# Patient Record
Sex: Female | Born: 1940 | Race: White | Hispanic: No | Smoking: Former smoker
Health system: Southern US, Community
[De-identification: ages and names within clinical notes are randomized; demographics above are authoritative.]

## PROBLEM LIST (undated history)

## (undated) DIAGNOSIS — F32A Depression, unspecified: Secondary | ICD-10-CM

## (undated) DIAGNOSIS — R7303 Prediabetes: Secondary | ICD-10-CM

## (undated) DIAGNOSIS — E785 Hyperlipidemia, unspecified: Secondary | ICD-10-CM

## (undated) DIAGNOSIS — F419 Anxiety disorder, unspecified: Secondary | ICD-10-CM

## (undated) DIAGNOSIS — F329 Major depressive disorder, single episode, unspecified: Secondary | ICD-10-CM

## (undated) DIAGNOSIS — I10 Essential (primary) hypertension: Secondary | ICD-10-CM

## (undated) DIAGNOSIS — M1990 Unspecified osteoarthritis, unspecified site: Secondary | ICD-10-CM

## (undated) DIAGNOSIS — T7840XA Allergy, unspecified, initial encounter: Secondary | ICD-10-CM

## (undated) DIAGNOSIS — M8580 Other specified disorders of bone density and structure, unspecified site: Secondary | ICD-10-CM

## (undated) DIAGNOSIS — K219 Gastro-esophageal reflux disease without esophagitis: Secondary | ICD-10-CM

## (undated) DIAGNOSIS — C801 Malignant (primary) neoplasm, unspecified: Secondary | ICD-10-CM

## (undated) HISTORY — DX: Depression, unspecified: F32.A

## (undated) HISTORY — DX: Allergy, unspecified, initial encounter: T78.40XA

## (undated) HISTORY — DX: Prediabetes: R73.03

## (undated) HISTORY — PX: APPENDECTOMY: SHX54

## (undated) HISTORY — DX: Major depressive disorder, single episode, unspecified: F32.9

## (undated) HISTORY — PX: CATARACT EXTRACTION W/ INTRAOCULAR LENS  IMPLANT, BILATERAL: SHX1307

## (undated) HISTORY — DX: Anxiety disorder, unspecified: F41.9

## (undated) HISTORY — DX: Malignant (primary) neoplasm, unspecified: C80.1

## (undated) HISTORY — PX: ABDOMINAL EXPLORATION SURGERY: SHX538

## (undated) HISTORY — PX: OTHER SURGICAL HISTORY: SHX169

## (undated) HISTORY — DX: Other specified disorders of bone density and structure, unspecified site: M85.80

---

## 1967-08-19 HISTORY — PX: DILATION AND CURETTAGE OF UTERUS: SHX78

## 1978-08-18 HISTORY — PX: TUBAL LIGATION: SHX77

## 1999-01-14 ENCOUNTER — Ambulatory Visit (HOSPITAL_COMMUNITY): Admission: RE | Admit: 1999-01-14 | Discharge: 1999-01-14 | Payer: Self-pay | Admitting: Cardiovascular Disease

## 1999-01-14 ENCOUNTER — Encounter: Payer: Self-pay | Admitting: Cardiovascular Disease

## 1999-01-17 ENCOUNTER — Other Ambulatory Visit: Admission: RE | Admit: 1999-01-17 | Discharge: 1999-01-17 | Payer: Self-pay | Admitting: Obstetrics and Gynecology

## 2000-02-13 ENCOUNTER — Other Ambulatory Visit: Admission: RE | Admit: 2000-02-13 | Discharge: 2000-02-13 | Payer: Self-pay | Admitting: Obstetrics and Gynecology

## 2001-06-23 ENCOUNTER — Other Ambulatory Visit: Admission: RE | Admit: 2001-06-23 | Discharge: 2001-06-23 | Payer: Self-pay | Admitting: Obstetrics and Gynecology

## 2003-02-17 ENCOUNTER — Other Ambulatory Visit: Admission: RE | Admit: 2003-02-17 | Discharge: 2003-02-17 | Payer: Self-pay | Admitting: Obstetrics and Gynecology

## 2003-03-19 DIAGNOSIS — M8580 Other specified disorders of bone density and structure, unspecified site: Secondary | ICD-10-CM

## 2003-03-19 HISTORY — DX: Other specified disorders of bone density and structure, unspecified site: M85.80

## 2004-04-02 ENCOUNTER — Other Ambulatory Visit: Admission: RE | Admit: 2004-04-02 | Discharge: 2004-04-02 | Payer: Self-pay | Admitting: Obstetrics and Gynecology

## 2005-08-18 HISTORY — PX: KNEE ARTHROSCOPY: SHX127

## 2006-08-18 HISTORY — PX: NECK SURGERY: SHX720

## 2007-01-19 ENCOUNTER — Encounter (INDEPENDENT_AMBULATORY_CARE_PROVIDER_SITE_OTHER): Payer: Self-pay | Admitting: Otolaryngology

## 2007-01-19 ENCOUNTER — Ambulatory Visit (HOSPITAL_BASED_OUTPATIENT_CLINIC_OR_DEPARTMENT_OTHER): Admission: RE | Admit: 2007-01-19 | Discharge: 2007-01-19 | Payer: Self-pay | Admitting: Otolaryngology

## 2007-04-02 ENCOUNTER — Ambulatory Visit: Payer: Self-pay | Admitting: Internal Medicine

## 2007-09-14 ENCOUNTER — Other Ambulatory Visit: Admission: RE | Admit: 2007-09-14 | Discharge: 2007-09-14 | Payer: Self-pay | Admitting: Obstetrics and Gynecology

## 2008-04-05 ENCOUNTER — Ambulatory Visit: Payer: Self-pay | Admitting: Internal Medicine

## 2008-04-19 ENCOUNTER — Encounter: Payer: Self-pay | Admitting: Internal Medicine

## 2008-04-19 ENCOUNTER — Ambulatory Visit: Payer: Self-pay | Admitting: Internal Medicine

## 2008-04-21 ENCOUNTER — Encounter: Payer: Self-pay | Admitting: Internal Medicine

## 2008-11-28 ENCOUNTER — Observation Stay (HOSPITAL_COMMUNITY): Admission: EM | Admit: 2008-11-28 | Discharge: 2008-11-28 | Payer: Self-pay | Admitting: Emergency Medicine

## 2008-11-28 ENCOUNTER — Ambulatory Visit: Payer: Self-pay | Admitting: Family Medicine

## 2008-11-28 IMAGING — CR DG CHEST 1V PORT
1 series · 1 of 1 positions shown · non-contrast
Comparison: None.

CLINICAL DATA: Chest and throat tightness.

PORTABLE CHEST - 1 VIEW

[AP]
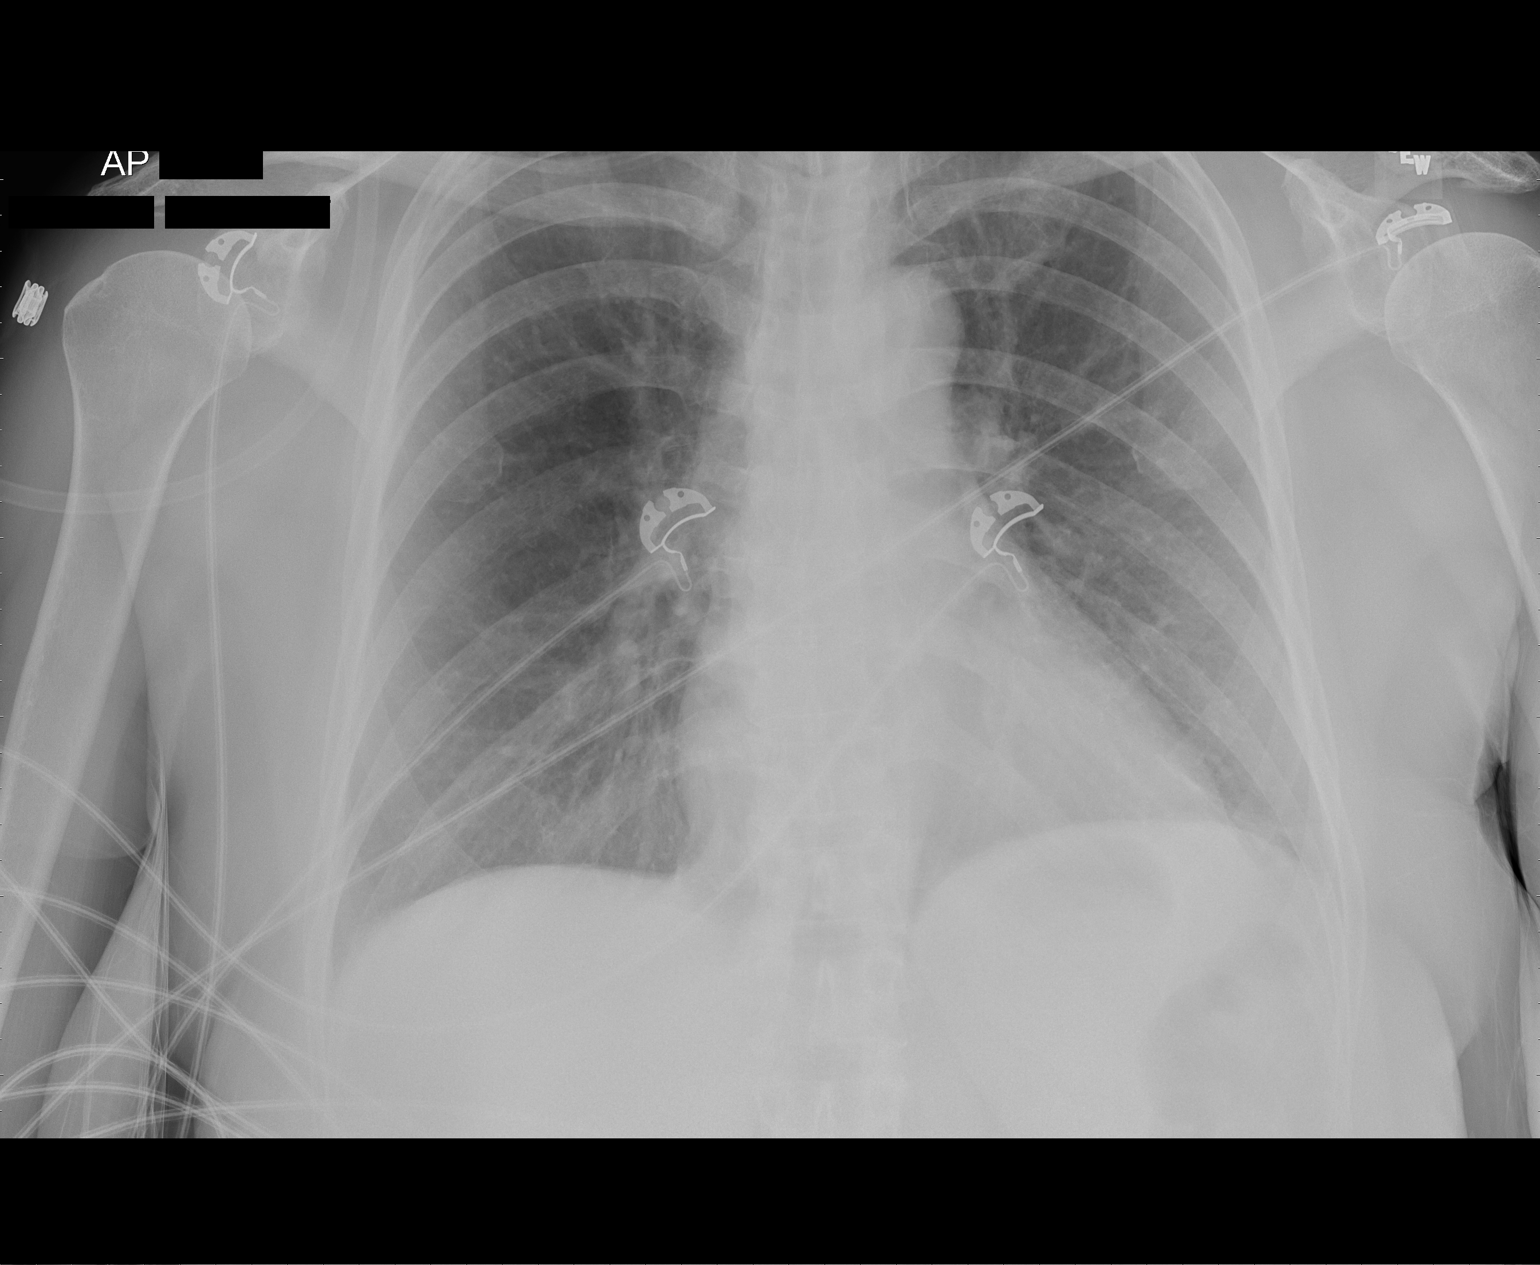

[1 of 1 positions shown; findings below may reference images not displayed]

FINDINGS: Normal sized heart.  Clear lungs.  Minimal scoliosis.
IMPRESSION: No acute abnormality.

## 2009-10-15 ENCOUNTER — Encounter: Admission: RE | Admit: 2009-10-15 | Discharge: 2009-10-15 | Payer: Self-pay | Admitting: Emergency Medicine

## 2009-10-15 IMAGING — RF DG UGI W/ HIGH DENSITY W/KUB
11 series · 11 of 11 positions shown · non-contrast
Comparison: None.

CLINICAL DATA: Abdominal pain.

UPPER GI SERIES WITH KUB
TECHNIQUE: Routine upper GI series was performed with thin and
high density barium.
Fluoroscopy Time: 1.6 minutes

[Series 1: run · 1 of 1 slices shown (1 of 10)]
[im 1/1]
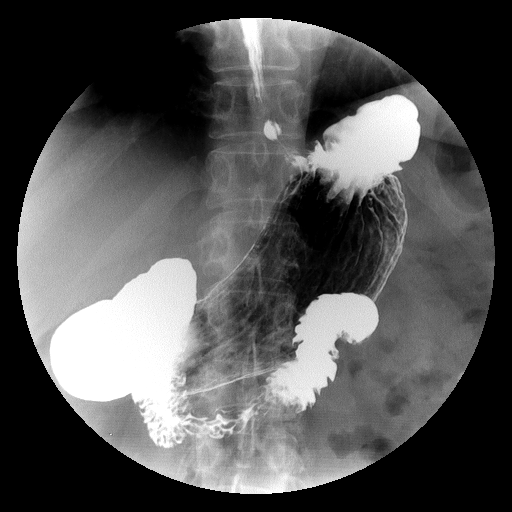

[Series 2: run · 1 of 1 slices shown (2 of 10)]
[im 1/1]
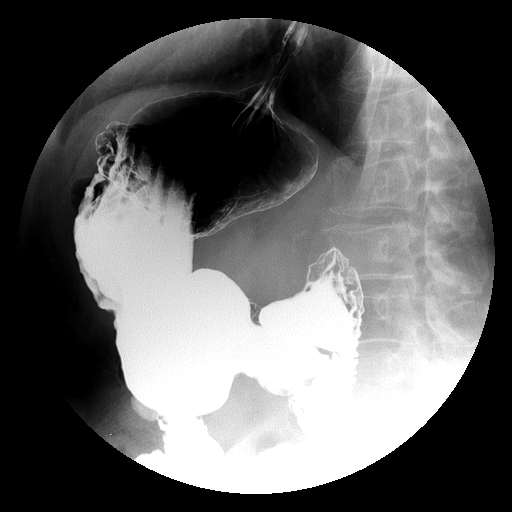

[Series 3: run · 1 of 1 slices shown (3 of 10)]
[im 1/1]
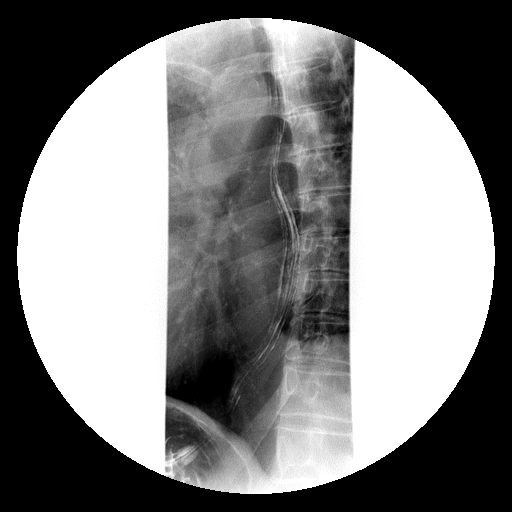

[Series 4: run · 1 of 1 slices shown (4 of 10)]
[im 1/1]
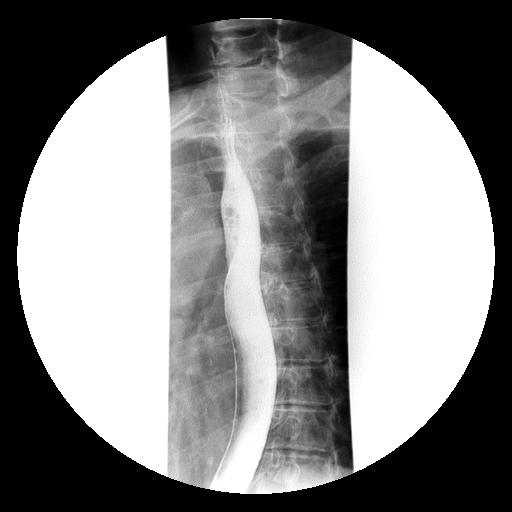

[Series 5: run · 1 of 1 slices shown (5 of 10)]
[im 1/1]
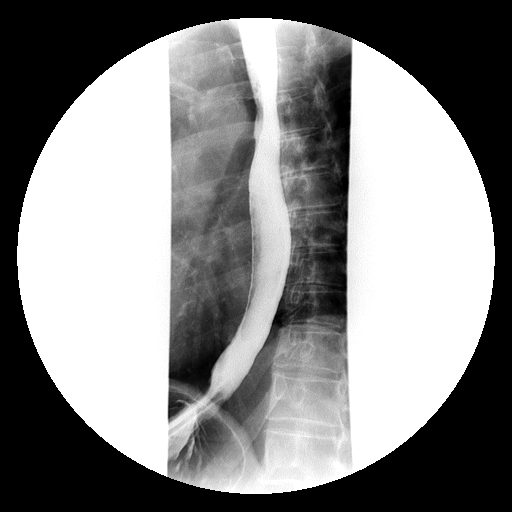

[Series 6: run · 1 of 1 slices shown (6 of 10)]
[im 1/1]
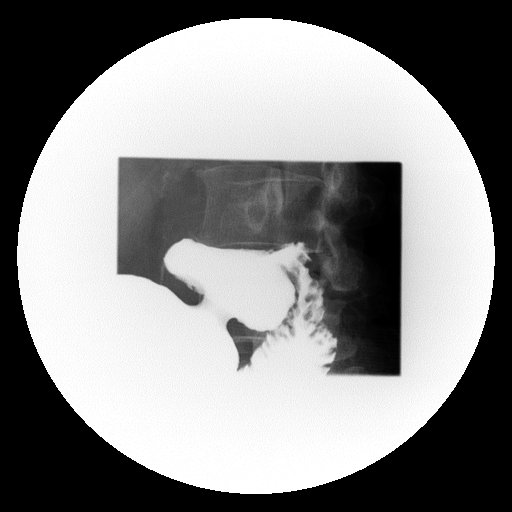

[Series 7: run · 1 of 1 slices shown (7 of 10)]
[im 1/1]
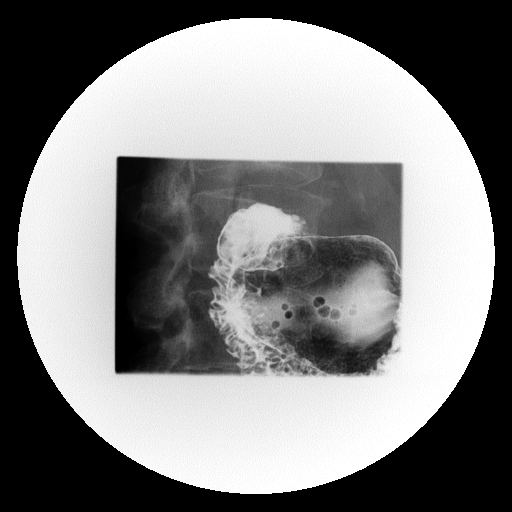

[Series 8: run · 1 of 1 slices shown (8 of 10)]
[im 1/1]
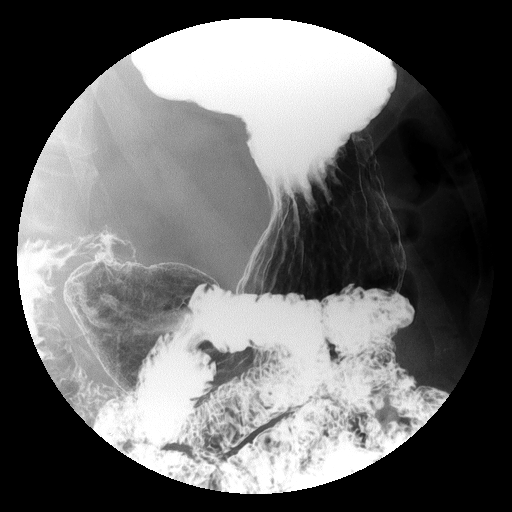

[Series 9: run · 1 of 1 slices shown (9 of 10)]
[im 1/1]
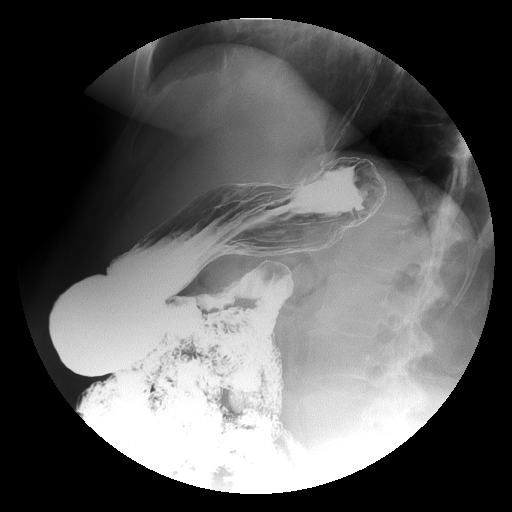

[Series 10: run · 1 of 1 slices shown (10 of 10)]
[im 1/1]
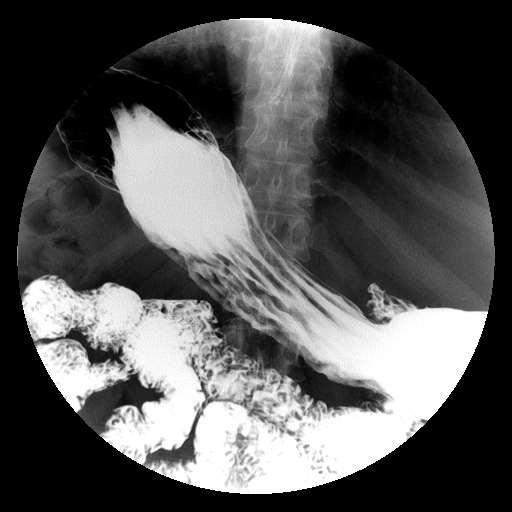

[Series 1001: view not recorded · 0.20mm/px · 1 of 1 slices shown]
[im 1/1]
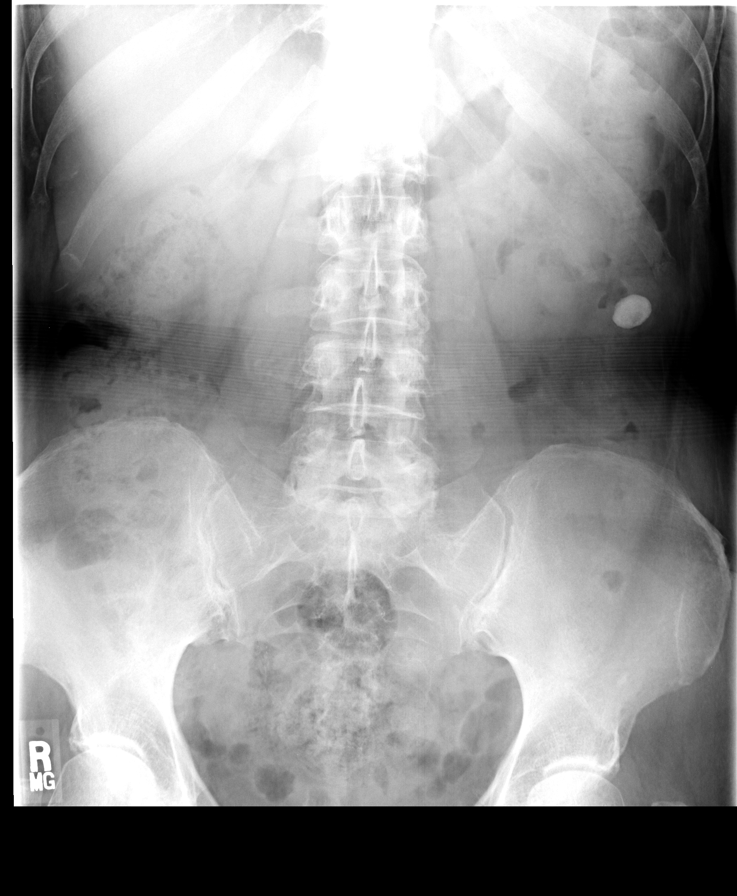

[11 of 11 positions shown; findings below may reference images not displayed]

FINDINGS: Scout view of the abdomen shows stool throughout the
colon.  A 2 cm ovoid high density structure in the left mid abdomen
is presumably within the colon.  Phleboliths are seen in the
anatomic pelvis.  There are degenerative changes in the lower
lumbar spine and hips.

Primary peristalsis is occasionally sluggish.  No esophageal fold
thickening, stricture or obstruction.  There may be a tiny hiatal
hernia.  Stomach and duodenal bulb are unremarkable.
IMPRESSION: Occasionally sluggish primary esophageal peristalsis.

## 2009-10-15 IMAGING — US US ABDOMEN COMPLETE
1 series · 14 of 25 positions shown · non-contrast
Comparison: None.

CLINICAL DATA: Abdominal pain.

COMPLETE ABDOMINAL ULTRASOUND

[Series 1: us abdomen complete · 0.24mm/px · 14 of 84 slices shown]
[im 1/84]
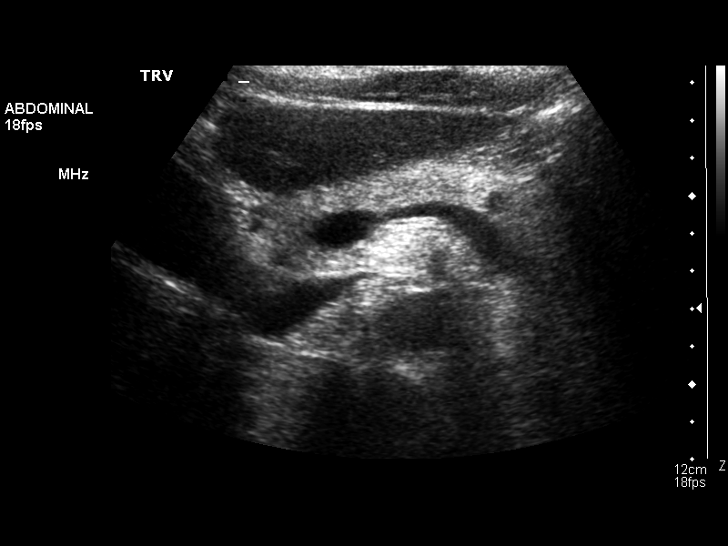
[im 7/84]
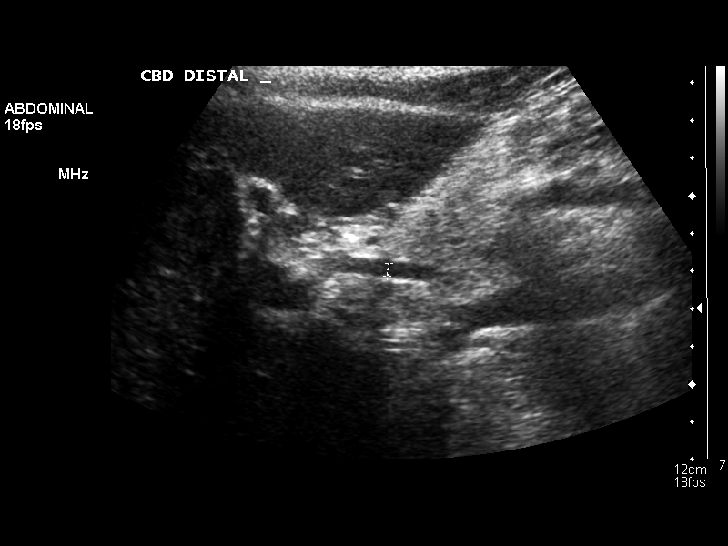
[im 14/84]
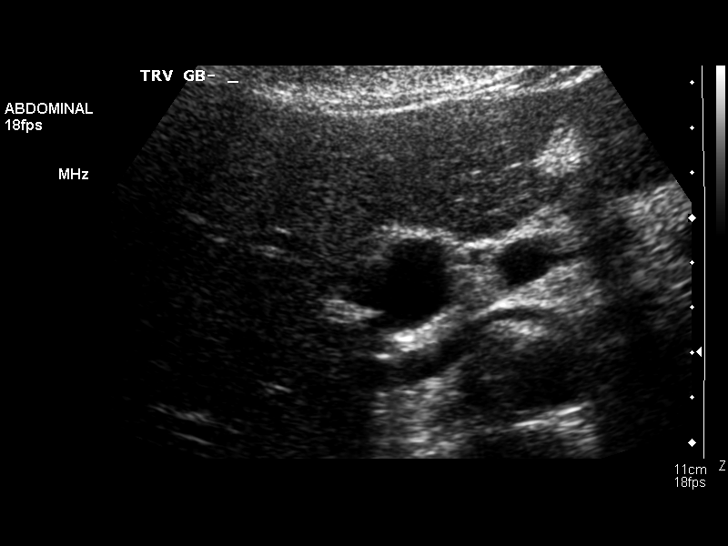
[im 21/84]
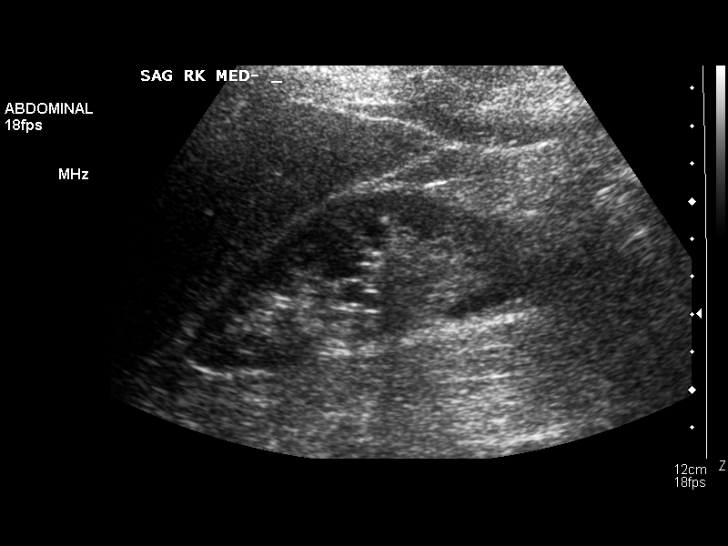
[im 28/84]
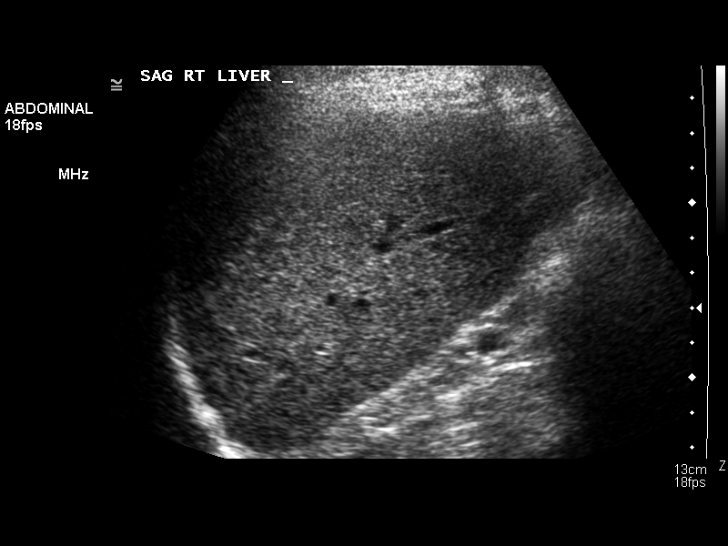
[im 32/84]
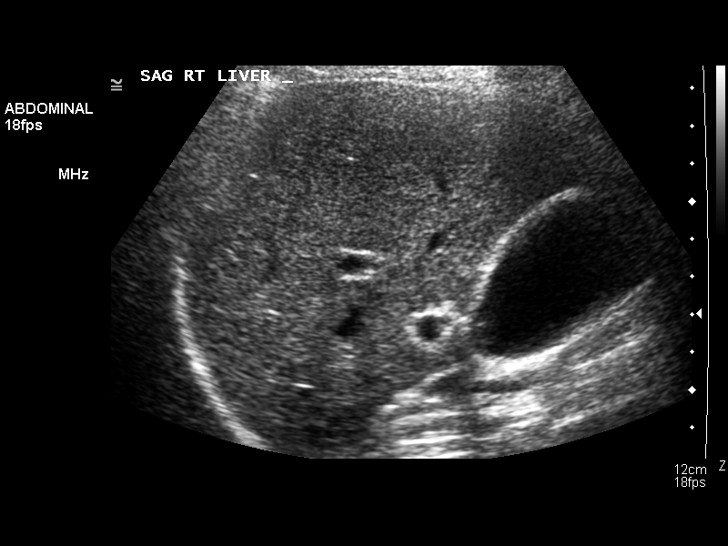
[im 39/84]
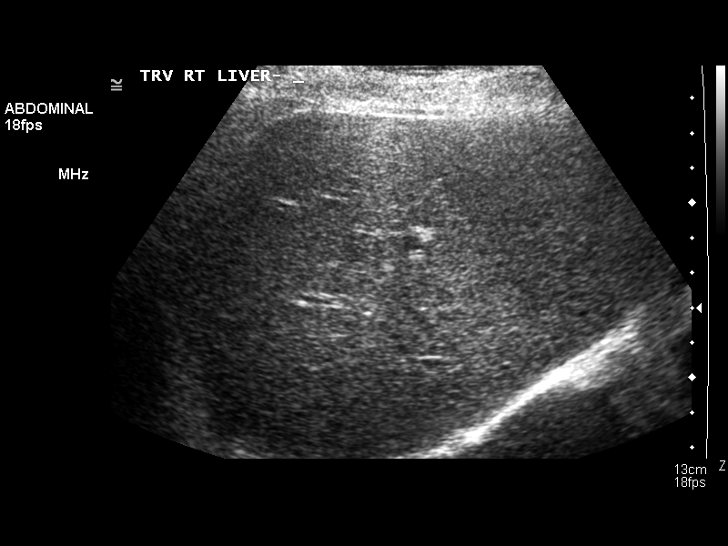
[im 45/84]
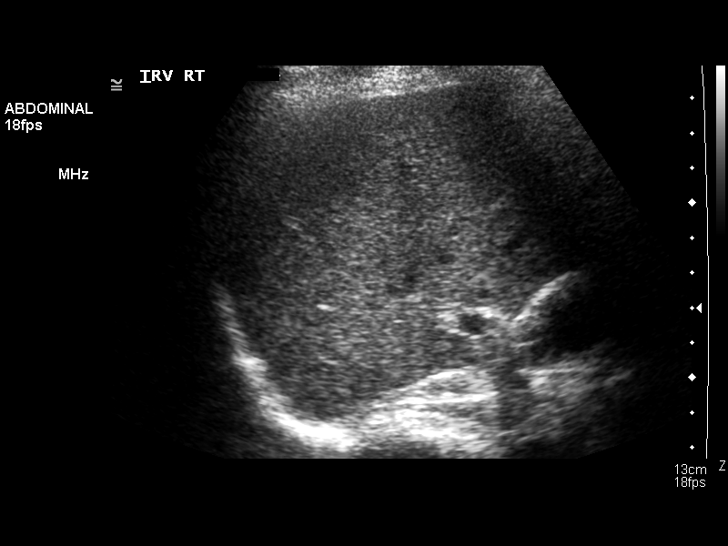
[im 52/84]
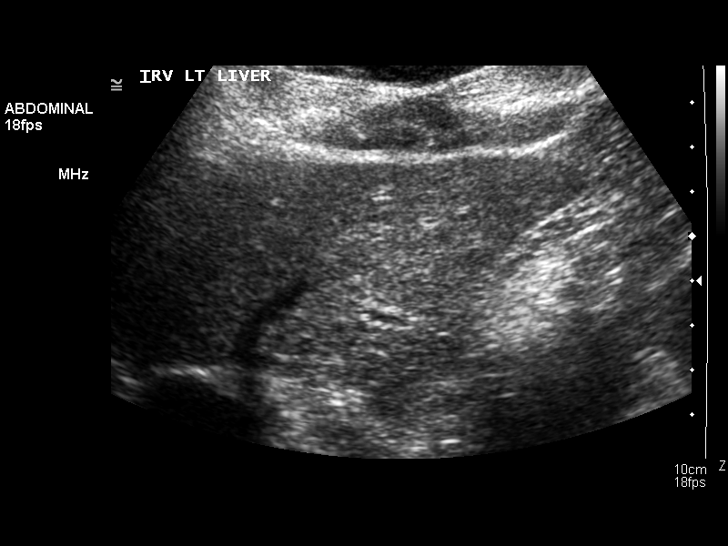
[im 56/84]
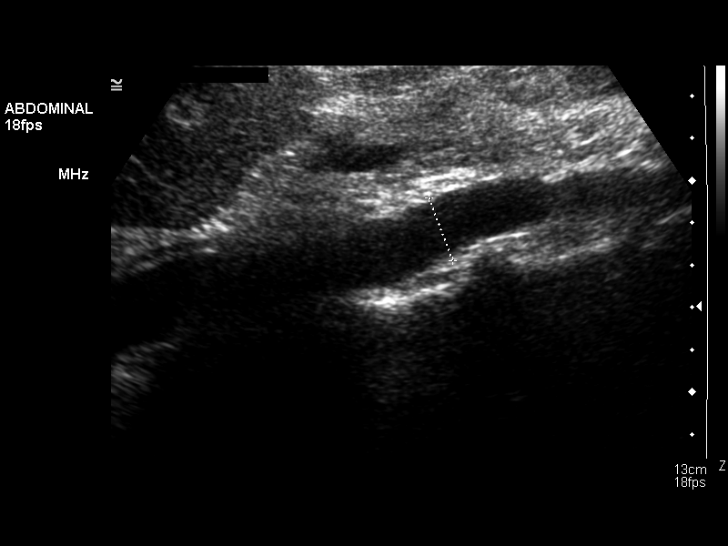
[im 63/84]
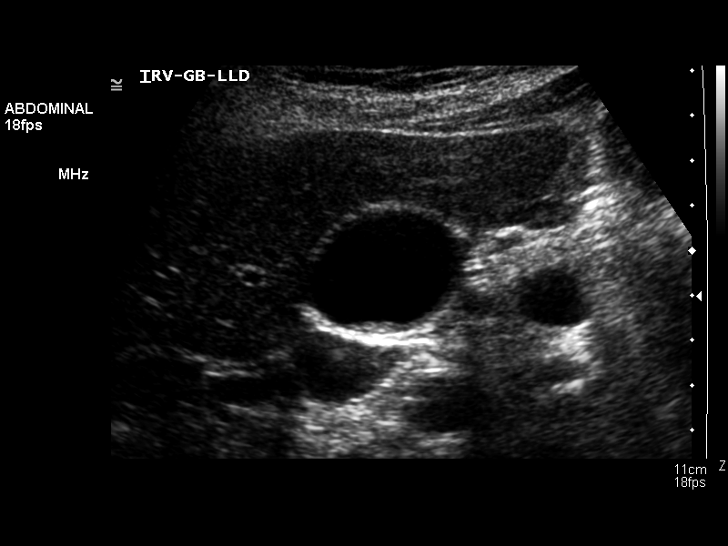
[im 70/84]
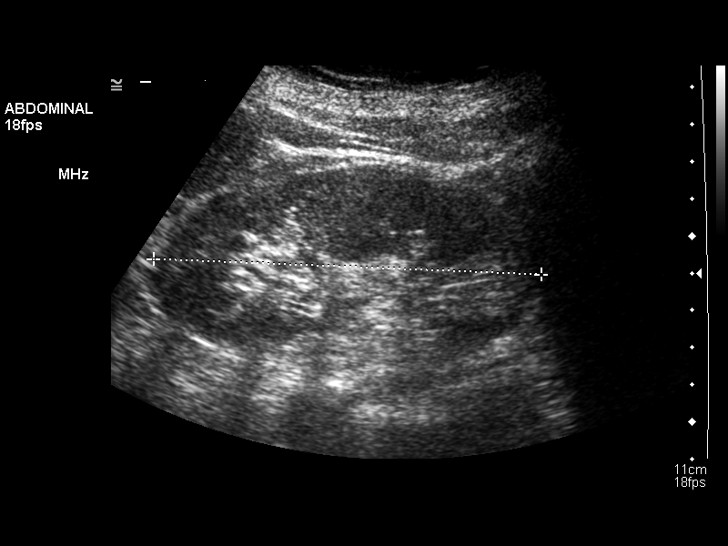
[im 77/84]
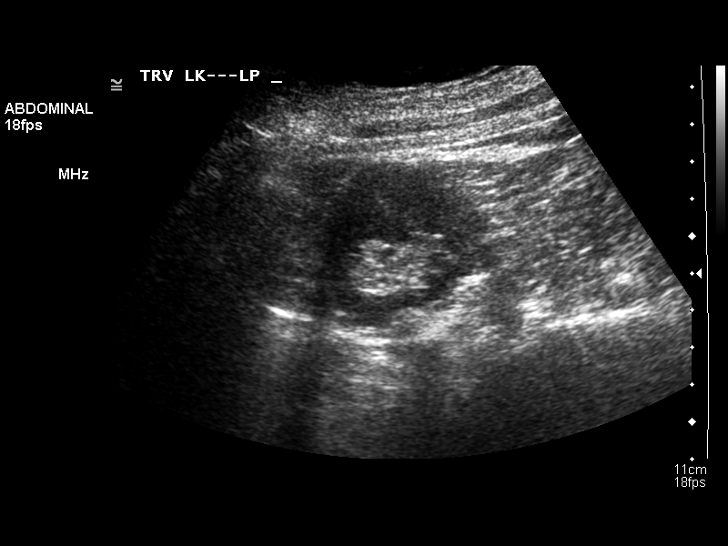
[im 84/84]
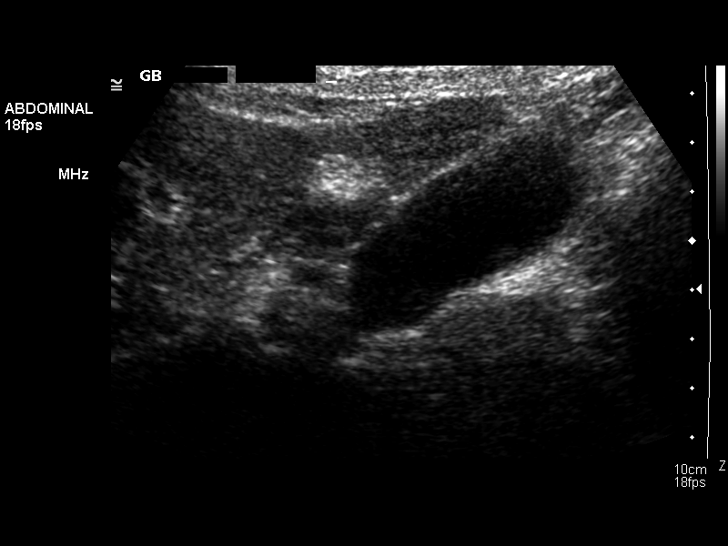

[14 of 25 positions shown; findings below may reference images not displayed]

FINDINGS: Gallbladder:  Small amount of nonshadowing debris is seen in the
gallbladder, dependently.  No shadowing stones.  No gallbladder
wall thickening or sonographic Murphy's sign.

Common bile duct:  Measures 4 mm, within normal limits.

Liver:  Negative.

IVC:  Visualized.

Pancreas:  Negative.

Spleen:  Measures 6.3 cm, negative.

Right Kidney:  Measures 11.0 cm, negative.

Left Kidney:  Measures 10.4 cm, negative.

Abdominal aorta:  No aneurysm identified.
IMPRESSION: 1.  No acute findings.
2.  Gallbladder sludge.

## 2009-11-20 ENCOUNTER — Ambulatory Visit: Payer: Self-pay | Admitting: Obstetrics and Gynecology

## 2009-11-20 ENCOUNTER — Other Ambulatory Visit: Admission: RE | Admit: 2009-11-20 | Discharge: 2009-11-20 | Payer: Self-pay | Admitting: Obstetrics and Gynecology

## 2010-09-08 ENCOUNTER — Encounter: Payer: Self-pay | Admitting: Emergency Medicine

## 2010-11-27 LAB — DIFFERENTIAL
Basophils Absolute: 0 10*3/uL (ref 0.0–0.1)
Eosinophils Relative: 1 % (ref 0–5)
Lymphocytes Relative: 42 % (ref 12–46)
Lymphs Abs: 2.4 10*3/uL (ref 0.7–4.0)
Monocytes Absolute: 0.4 10*3/uL (ref 0.1–1.0)
Neutro Abs: 2.8 10*3/uL (ref 1.7–7.7)

## 2010-11-27 LAB — HEMOGLOBIN A1C: Hgb A1c MFr Bld: 6.1 % (ref 4.6–6.1)

## 2010-11-27 LAB — LIPID PANEL
Cholesterol: 191 mg/dL (ref 0–200)
LDL Cholesterol: 126 mg/dL — ABNORMAL HIGH (ref 0–99)
Triglycerides: 84 mg/dL (ref <150)

## 2010-11-27 LAB — CBC
HCT: 39.2 % (ref 36.0–46.0)
Hemoglobin: 13.6 g/dL (ref 12.0–15.0)
RDW: 13.6 % (ref 11.5–15.5)
WBC: 5.7 10*3/uL (ref 4.0–10.5)

## 2010-11-27 LAB — POCT I-STAT, CHEM 8
BUN: 18 mg/dL (ref 6–23)
Calcium, Ion: 1.18 mmol/L (ref 1.12–1.32)
Chloride: 102 mEq/L (ref 96–112)
Creatinine, Ser: 0.9 mg/dL (ref 0.4–1.2)
Sodium: 137 mEq/L (ref 135–145)
TCO2: 28 mmol/L (ref 0–100)

## 2010-11-27 LAB — POCT CARDIAC MARKERS
Myoglobin, poc: 108 ng/mL (ref 12–200)
Myoglobin, poc: 191 ng/mL (ref 12–200)
Troponin i, poc: <0.05 ng/mL (ref 0.00–0.09)

## 2010-11-27 LAB — CARDIAC PANEL(CRET KIN+CKTOT+MB+TROPI): Relative Index: 1 (ref 0.0–2.5)

## 2010-12-31 NOTE — Op Note (Signed)
[name], [name]            ACCOUNT NO.:  [id number]   MEDICAL RECORD NO.:  [id number]          PATIENT TYPE:  AMB   LOCATION:  DSC                          FACILITY:  [facility]   PHYSICIAN:  Christopher E. Ezzard Standing, M.D.DATE OF BIRTH:  1941-01-04   DATE OF PROCEDURE:  01/19/2007  DATE OF DISCHARGE:                               OPERATIVE REPORT   PREOPERATIVE DIAGNOSIS:  Right submandibular gland mass.   POSTOPERATIVE DIAGNOSIS:  Right submandibular gland mass.   OPERATIONS:  Excision of right submandibular gland.   SURGEON:  Kristine Garbe. Ezzard Standing, M.D.   ANESTHESIA:  General endotracheal.   COMPLICATIONS:  None.   BRIEF CLINICAL NOTE:  Carol Tucker is a 70 year old female who has  had a large right submandibular gland for about 10 years.  She feels  like it got a little bit larger.  It is twice the size of the left  submandibular gland.  It protrudes in her neck, measuring approximately  4-5 cm in size.  She is taken to the operating room at this time for  excision of right submandibular gland.   DESCRIPTION OF PROCEDURE:  After adequate endotracheal anesthesia, the  patient received 1 gram Ancef IV preoperatively.  The neck was prepped  with Betadine solution and draped out with sterile towels.  The proposed  incision site at the inferior aspect of the mass was marked out and  injected with Xylocaine with epinephrine.   An incision was then made down through the skin, subcutaneous tissue,  and platysma muscle.  The facial vein was identified and ligated with 2-  0 silk suture and retracted superiorly, and dissection was carried out  on the inferior aspect of the submandibular gland initially and tissue  reflected superiorly so as to protect the marginal nerve.  The facial  artery branch to the gland was identified and ligated with 2-0 silk  suture.  The submandibular gland was then dissected off of the digastric  muscle inferiorly.  In the submandibular region, the  submandibular duct  and the branch of the lingual nerve were identified, and this area was  ligated with 2-0 silk suture and divided.  Hemostasis was obtained with  cautery and 3-0 silk ligatures in the smaller vessels.  The wound was  irrigated with saline.  The specimen was sent to pathology in saline.  After obtaining adequate hemostasis, the defect was closed with 3-0  chromic sutures subcutaneously and 5-0 nylon to reapproximate the skin  edges.  A dressing was applied.   The patient was awakened from anesthesia and transferred to the recovery  room postoperatively doing well.   DISPOSITION:  [name] is discharged home later this morning on Tylenol  and Vicodin p.r.n. pain, Keflex 500 mg b.i.d. for 4 days.  I will have  her follow up in my office in 6 days for recheck and to have the sutures  removed and review pathology.           ______________________________  Kristine Garbe Ezzard Standing, M.D.     CEN/MEDQ  D:  01/19/2007  T:  01/19/2007  Job:  [id number]

## 2010-12-31 NOTE — H&P (Signed)
NAME:  Carol Tucker, TECSON            ACCOUNT NO.:  [id number]   MEDICAL RECORD NO.:  [id number]          PATIENT TYPE:  EMS   LOCATION:  MAJO                         FACILITY:  [facility]   PHYSICIAN:  Paula Compton, MD        DATE OF BIRTH:  09-05-1940   DATE OF ADMISSION:  11/28/2008  DATE OF DISCHARGE:                              HISTORY & PHYSICAL   PRIMARY CARE PHYSICIAN:  Jonita Albee, MD with [facility].   CHIEF COMPLAINT:  Throat tightness and chest tightness.   HISTORY OF PRESENT ILLNESS:  Ms. Carol Tucker is a pleasant 70 year old  white female with a history of hyperlipidemia and hypertension as well  as anxiety and depression with no significant cardiac history in herself  that presents with about 8 hours of throat tightness radiating to her  right jaw that started at rest.  Progressed throughout the night  dyspnea, chest pressure, and shortness of breath when reading [employer] that gave her the indications of an MI in females.  She also has  a significant family history for MI in her father when he was in his  66s.  The patient took aspirin x2 without relief and called EMS,  nitroglycerin x2 relieved the chest pressure that was nonexertional, but  throat symptoms had resolved by the time ED arrived.  She is currently  at baseline with mild nausea.  She has remote smoking history and also  negative Cardiolite per patient in 2000.   REVIEW OF SYSTEMS:  As above, otherwise negative.   PAST MEDICAL HISTORY:  1. Hypertension.  2. Hyperlipidemia.  3. Anxiety.  4. Depression.  She was previously on medications for her depression.   MEDICATIONS:  1. Hydrochlorothiazide 12.5 p.o. daily.  2. Prevacid 15 mg p.o. daily.  3. Aspirin 81 mg p.o. daily.  4. Calcium plus vitamin D daily.   ALLERGIES:  She has an allergy to [facility], which causes extrapyramidal  symptoms.   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   SOCIAL HISTORY:  She lives in [place] with her son-in-law and his  mom.  She is a retired Engineer, site.  She has a remote smoking  history.  She quit over 30 years ago, but has a 10-year smoking history.  She occasionally drinks wine.  Denies any drugs.   FAMILY HISTORY:  Significant for father who had an MI in his 88s.   PHYSICAL EXAMINATION:  VITAL SIGNS:  Temperature 96.6, pulse 77,  respirations 16, blood pressure of 186/79 upon admission to the ED, but  she has systolic in the 140s during our exam, pulse ox 100% on 4 L,  which was turned off and maintained at 100% during our exam.  GENERAL:  She is alert and oriented x3 and in no apparent distress.  HEENT:  Normocephalic and atraumatic.  Pupils are equal, round, and  reactive to light.  Extraocular muscles intact.  She has moist mucous  membranes.  NECK:  Supple.  She has  no masses.  Full range of motion.  CARDIOVASCULAR:  Regular rate and rhythm.  No murmurs, rubs, or gallops.  LUNGS:  Clear to auscultation bilaterally.  ABDOMEN:  Soft, nontender, and nondistended.  Bowel sounds x4.  EXTREMITIES:  No cyanosis, clubbing, or edema.  NEUROLOGIC:  Cranial nerves II through XII are intact.  She has strength  normal in all extremities.  DTRs are normal.  Sensations are intact.  SKIN:  No rashes.   LABORATORIES AND STUDIES:  White blood cell 5.7, hemoglobin 13.6,  hematocrit 39.2, and platelets 176.  Sodium 137, potassium 3.6, chloride  102, CO2 of 28, BUN 18, creatinine 0.9, glucose 122.  EKG is normal  sinus rhythm with a rate of 78.  Point-of-care enzymes are negative.  Chest x-ray is negative for acute abnormality.   ASSESSMENT AND PLAN:  The patient is a 70 year old white female with:  1. Throat tightness and chest pressure.  Her etiology is very unlikely      to be cardiac.  Differential diagnoses includes esophageal spasm,      anxiety, psychiatry related.  Cardiac  is unlikely, although we will      admit her to telemetry bed for observation overnight.  To rule out      an myocardial infarction, we will cycle cardiac enzymes.  Get a      fasting lipid panel and A1c in the morning.  We will also repeat      EKG in the morning.  We will provide a p.r.n. gastrointestinal      cocktail.  The patient has another episode of throat tightness to      see if that has any effect on her tightness/pain.  I will consider      Xanax as needed if she also has tightness.  She is status post      aspirin.  No oxygen is needed.  Her cardiologist is Dr. Elease Hashimoto and      can be seen as an outpatient.  2. Hypertension.  Continue her home medications.  3. Hyperlipidemia.  Continue home medications.  4. Fluids, electrolytes, nutrition/gastrointestinal.  Heart healthy      diet.  Hep-Lock IV.  Her electrolytes were within normal limits.  5. Prophylaxis.  Proton pump inhibitor for peptic ulcer disease and      heparin for deep vein thrombosis.   DISPOSITION:  Pending MI rule out and chest pain free.      Helane Rima, MD  Electronically Signed      Paula Compton, MD  Electronically Signed    EW/MEDQ  D:  11/28/2008  T:  11/28/2008  Job:  [id number]

## 2011-01-03 NOTE — Discharge Summary (Signed)
Carol Tucker, GOEHRING            ACCOUNT NO.:  [id number]   MEDICAL RECORD NO.:  [id number]          PATIENT TYPE:  OBV   LOCATION:  3735                         FACILITY:  [facility]   PHYSICIAN:  Paula Compton, MD        DATE OF BIRTH:  07-12-1941   DATE OF ADMISSION:  11/28/2008  DATE OF DISCHARGE:  11/28/2008                               DISCHARGE SUMMARY   PRIMARY CARE PHYSICIAN:  Dr. Kevan Ny with [facility].   DISCHARGE DIAGNOSES:  1. Throat tightness.  2. Hypertension.  3. Hyperlipidemia.  4. Anxiety.  5. Depression.   DISCHARGE MEDICATIONS:  1. Aspirin 81 mg by mouth daily.  2. Hydrochlorothiazide 12.5 mg by mouth daily.  3. Protonix 40 mg by mouth daily.  4. Zocor 20 mg by mouth nightly.  5. Zofran 4 mg by mouth every 6 hours as needed for nausea, #20, no      refills.   PROCEDURES:  Chest x-ray:  Impression:  No acute abnormality.   LABORATORY DATA:  White blood cell 5.7, hemoglobin 13.6, hematocrit  39.2, platelets 176.  Sodium 137, potassium 2.6, chloride 102, glucose  122, BUN 18, creatinine 0.9.  Two point of care markers 2 hours apart,  as well as a cardiac enzyme panel 6 hours later were all negative.  Cholesterol 191, triglyceride 84, HDL 48, LDL 126, hemoglobin A1c 6.1.   BRIEF HOSPITAL COURSE:  1. Please see H&P for details, but briefly, Ms. Laurell Coalson is a      pleasant 70 year old female with a history of hyperlipidemia,      hypertension, and anxiety with no significant cardiac history that      presented with about 8 hours of throat tightness that radiated to      her right jaw.  She said that it started at rest and progressed      throughout the night and became associated with dyspnea and chest      pressure and increased while she was reading some food magazine      with indications of an MI in female.  She took 4 baby aspirin and      called EMS, brought her to the emergency department.  She was      admitted to telemetry bed and observed overnight.   The EKG in the      emergency department was normal sinus rhythm, no ST changes.  An      EKG in the morning had no changes.  Cardiac enzymes were cycled and      were all negative x3.  The patient's fasting lipid panel and A1c      were all within normal limits.  She felt very well in the morning,      had no complaints, all symptoms have resolved.  She was discharged      with instructions to follow up with her primary care physician, Dr.      Kevan Ny as well as to set up an outpatient stress test with Dr.      Elease Hashimoto her cardiologist.  She has a cardiologist only because she  had a negative Cardiolite in 2000.  It was felt that this was      probably secondary to an anxiety or panic attack.  However, the      patient was discharged home with Protonix in case for the GI      component to her throat tightness.  She was given red flags that      would prompt quick return for seeking medical care.  2. Hypertension.  The patient was maintained on her home medications.  3. Hyperlipidemia.  The patient's fasting lipid panel as above.      Continue Zocor 20 mg by mouth each night.   FOLLOWUP:  The patient was instructed to follow up with Dr. Kevan Ny in 1-2  weeks as above and to follow up with Dr. Elease Hashimoto for an outpatient  workup.      Helane Rima, MD  Electronically Signed      Paula Compton, MD  Electronically Signed    EW/MEDQ  D:  11/29/2008  T:  11/30/2008  Job:  [id number]

## 2011-01-03 NOTE — Assessment & Plan Note (Signed)
[facility]                                 ON-CALL NOTE   Carol Tucker, Carol Tucker                     MRN:          [id number]  DATE:04/27/2007                            DOB:          04-27-1941    PHONE NUMBER:  [phone].   TIME OF CALL:  6:10 p.m.   GASTROENTEROLOGIST:  Wilhemina Bonito. Marina Goodell, M.D.   [name] called this evening shortly after beginning a MiraLax and  Gatorade prep.  She relates that she has developed shaking chills and  malaise.  She was not feeling poorly prior to the bowel preparation  starting.  She has no chest pain, shortness of breath, or vomiting.  She  has had some mild nausea since beginning the prep.  I advised her to  discontinue the prep and see if her symptoms resolve over the next 20-30  minutes, and to call me if she still feels poorly in any way.  If she  feels better, she may resume the preparation.  At 7:15, I received a  call from [name]'s daughter, Carol Tucker, and she reported that  the patient still was having shaking chills and felt very poorly.  I  advised them that this was likely not related to the bowel preparation,  and it seems like she has become acutely ill from another cause.  I  advised her to go to an urgent care center or the emergency room for  further evaluation this evening and to discontinue the bowel  preparation.     Venita Lick. Russella Dar, MD, [facility]  Electronically Signed    MTS/MedQ  DD: 04/28/2007  DT: 04/28/2007  Job #: [id number]   cc:   Wilhemina Bonito. Marina Goodell, MD

## 2011-02-20 ENCOUNTER — Encounter (INDEPENDENT_AMBULATORY_CARE_PROVIDER_SITE_OTHER): Payer: Medicare Other | Admitting: Obstetrics and Gynecology

## 2011-02-20 DIAGNOSIS — N951 Menopausal and female climacteric states: Secondary | ICD-10-CM

## 2011-02-20 DIAGNOSIS — M899 Disorder of bone, unspecified: Secondary | ICD-10-CM

## 2011-02-20 DIAGNOSIS — N393 Stress incontinence (female) (male): Secondary | ICD-10-CM

## 2011-02-20 DIAGNOSIS — N952 Postmenopausal atrophic vaginitis: Secondary | ICD-10-CM

## 2011-06-05 LAB — BASIC METABOLIC PANEL
BUN: 16
CO2: 29
Chloride: 104
Creatinine, Ser: 0.77
Glucose, Bld: 137 — ABNORMAL HIGH
Potassium: 4.1

## 2011-06-05 LAB — POCT HEMOGLOBIN-HEMACUE: Operator id: 128471

## 2011-09-15 ENCOUNTER — Ambulatory Visit (INDEPENDENT_AMBULATORY_CARE_PROVIDER_SITE_OTHER): Payer: Medicare Other | Admitting: Internal Medicine

## 2011-09-15 DIAGNOSIS — I10 Essential (primary) hypertension: Secondary | ICD-10-CM

## 2011-09-15 DIAGNOSIS — H6120 Impacted cerumen, unspecified ear: Secondary | ICD-10-CM

## 2011-09-15 DIAGNOSIS — E782 Mixed hyperlipidemia: Secondary | ICD-10-CM

## 2011-09-15 DIAGNOSIS — H9319 Tinnitus, unspecified ear: Secondary | ICD-10-CM

## 2011-09-15 DIAGNOSIS — H9110 Presbycusis, unspecified ear: Secondary | ICD-10-CM

## 2011-09-17 ENCOUNTER — Telehealth: Payer: Self-pay

## 2011-09-17 DIAGNOSIS — E782 Mixed hyperlipidemia: Secondary | ICD-10-CM

## 2011-09-17 NOTE — Telephone Encounter (Signed)
.  [facility] PT TAKES PRIVACTIN AND WANTED TO HAVE SOME CALLED IN FOR HER. HAD ALREADY SPOKEN WITH SOMEONE ABOUT IT. PLEASE CALL [phone]

## 2011-09-18 MED ORDER — PRAVASTATIN SODIUM 40 MG PO TABS: 40.0000 mg | ORAL_TABLET | Freq: Every day | ORAL | Status: DC

## 2011-09-18 NOTE — Telephone Encounter (Signed)
Please call patient and advise that rx has been sent. csj

## 2011-09-18 NOTE — Telephone Encounter (Signed)
Pt called and notified that rx has been called in

## 2011-11-02 ENCOUNTER — Encounter (HOSPITAL_COMMUNITY): Payer: Self-pay | Admitting: *Deleted

## 2011-11-02 ENCOUNTER — Emergency Department (HOSPITAL_COMMUNITY)
Admission: EM | Admit: 2011-11-02 | Discharge: 2011-11-03 | Disposition: A | Discharge status: Home / Self Care | Payer: Medicare Other | Attending: Emergency Medicine | Admitting: Emergency Medicine

## 2011-11-02 DIAGNOSIS — E785 Hyperlipidemia, unspecified: Secondary | ICD-10-CM | POA: Insufficient documentation

## 2011-11-02 DIAGNOSIS — Z79899 Other long term (current) drug therapy: Secondary | ICD-10-CM | POA: Insufficient documentation

## 2011-11-02 DIAGNOSIS — I10 Essential (primary) hypertension: Secondary | ICD-10-CM | POA: Insufficient documentation

## 2011-11-02 HISTORY — DX: Gastro-esophageal reflux disease without esophagitis: K21.9

## 2011-11-02 HISTORY — DX: Essential (primary) hypertension: I10

## 2011-11-02 HISTORY — DX: Hyperlipidemia, unspecified: E78.5

## 2011-11-02 NOTE — ED Notes (Signed)
Pt was noted to not have any neuro deficits.

## 2011-11-02 NOTE — ED Notes (Signed)
Pt states that she has had a headache x 2 days that she has attributed to sinuses and that tylenol did not relieve.  Pt states that she began to feel "almost dizzy" tonight around 2000.  Pt then took a second dose of her regular blood pressure medicine.  Pt states that around 2115, she started shaking and having a dry mouth.  Pt states that she no longer has a dry mouth or shaking but is slightly nauseated and right sided headache that is rated @ 6/10.

## 2011-11-03 NOTE — ED Provider Notes (Signed)
History     CSN: [id number]  Arrival date & time 11/02/11  2154   First MD Initiated Contact with Patient 11/03/11 0003      Chief Complaint  Patient presents with  . Hypertension    (Consider location/radiation/quality/duration/timing/severity/associated sxs/prior treatment) HPI History provided by pt.   Pt has had a right temporal headache x 2 days, intermittent initially but constant this afternoon and evening.  Has had similar headaches in the past but usually relieved by tylenol and/or ibuprofen and wasn't today.  No associated vision changes, dizziness, extremity weakness/paresthesias, dysphasia, dysarthria.  No head trauma.  Pt checked her BP and it was elevated at 178/76.  She took an extra dose of her lisinopril and it slightly improved.  She is concerned that her headache could be related.  Headache is currently 2/10 on pain scale.  She also had a brief episode of lightheadedness, hand tremors and dry mouth this afternoon.  No CP, SOB, palpitations.  No known h/o anxiety.    Past Medical History  Diagnosis Date  . Hypertension   . Hyperlipidemia   . GERD (gastroesophageal reflux disease)     Past Surgical History  Procedure Date  . Appendectomy     No family history on file.  History  Substance Use Topics  . Smoking status: Not on file  . Smokeless tobacco: Not on file  . Alcohol Use: No    OB History    Grav Para Term Preterm Abortions TAB SAB Ect Mult Living                  Review of Systems  All other systems reviewed and are negative.    Allergies  Metoclopramide hcl  Home Medications   Current Outpatient Rx  Name Route Sig Dispense Refill  . ACETAMINOPHEN 500 MG PO TABS Oral Take 1,000 mg by mouth every 6 (six) hours as needed. For pain    . ASPIRIN EC 81 MG PO TBEC Oral Take 81 mg by mouth daily.    Marland Kitchen FAMOTIDINE 20 MG PO TABS Oral Take 20 mg by mouth 2 (two) times daily.    Marland Kitchen HYDROCHLOROTHIAZIDE 12.5 MG PO CAPS Oral Take 12.5 mg by mouth  daily.    Marland Kitchen LISINOPRIL 5 MG PO TABS Oral Take 5 mg by mouth daily.    . ADULT MULTIVITAMIN W/MINERALS CH Oral Take 1 tablet by mouth daily.    Marland Kitchen NAPHAZOLINE HCL 0.012 % OP SOLN Both Eyes Place 1 drop into both eyes 4 (four) times daily as needed. For dry eyes    . PRAVASTATIN SODIUM 20 MG PO TABS Oral Take 20 mg by mouth daily.      BP 140/61  Pulse 72  Temp(Src) 98 F (36.7 C) (Oral)  Resp 16  SpO2 97%  Physical Exam  Nursing note and vitals reviewed. Constitutional: She is oriented to person, place, and time. She appears well-developed and well-nourished. No distress.  HENT:  Head: Normocephalic and atraumatic.  Eyes:       Normal appearance  Neck: Normal range of motion.  Cardiovascular: Normal rate and regular rhythm.        BP 147/56  Pulmonary/Chest: Effort normal and breath sounds normal.  Musculoskeletal: Normal range of motion.  Neurological: She is alert and oriented to person, place, and time. She has normal reflexes. No cranial nerve deficit or sensory deficit. Coordination normal.       5/5 and equal upper and lower extremity strength.  No past  pointing.     Skin: Skin is warm and dry. No rash noted.  Psychiatric: She has a normal mood and affect. Her behavior is normal.    ED Course  Procedures (including critical care time)  Labs Reviewed - No data to display No results found.   1. Hypertension       MDM  Pt presents w/ HTN despite compliance with lisinopril and hctz.  She took an extra lisinopril at home and BP improved to 140/61 by time of discharge.  Had a headache that may or may not have been associated but pain has nearly resolved and no focal neuro deficits on exam.  Has not had CP/SOB or vision changes.  Pt reassured and I recommended that she check her BP qd and f/u with her PCP if it continues to run high.  Return precautions discussed.         Arie Sabina [place], [place] 11/03/11 1147

## 2011-11-03 NOTE — Discharge Instructions (Signed)
Take tylenol as needed for headache.  Check your blood pressure once a day and if it continues to run high, schedule a follow up appointment with your doctor.  You should return to the ER if you develop chest pain, shortness of breath or worsening headache. Hypertension Information As your heart beats, it forces blood through your arteries. This force is your blood pressure. If the pressure is too high, it is called hypertension (HTN) or high blood pressure. HTN is dangerous because you may have it and not know it. High blood pressure may mean that your heart has to work harder to pump blood. Your arteries may be narrow or stiff. The extra work puts you at risk for heart disease, stroke, and other problems.  Blood pressure consists of two numbers, a higher number over a lower, 110/72, for example. It is stated as "110 over 72." The ideal is below 120 for the top number (systolic) and under 80 for the bottom (diastolic).  You should pay close attention to your blood pressure if you have certain conditions such as:  Heart failure.   Prior heart attack.   Diabetes   Chronic kidney disease.   Prior stroke.   Multiple risk factors for heart disease.  To see if you have HTN, your blood pressure should be measured while you are seated with your arm held at the level of the heart. It should be measured at least twice. A one-time elevated blood pressure reading (especially in the Emergency Department) does not mean that you need treatment. There may be conditions in which the blood pressure is different between your right and left arms. It is important to see your caregiver soon for a recheck. Most people have essential hypertension which means that there is not a specific cause. This type of high blood pressure may be lowered by changing lifestyle factors such as:  Stress.   Smoking.   Lack of exercise.   Excessive weight.   Drug/tobacco/alcohol use.   Eating less salt.  Most people do not have  symptoms from high blood pressure until it has caused damage to the body. Effective treatment can often prevent, delay or reduce that damage. TREATMENT  Treatment for high blood pressure, when a cause has been identified, is directed at the cause. There are a large number of medications to treat HTN. These fall into several categories, and your caregiver will help you select the medicines that are best for you. Medications may have side effects. You should review side effects with your caregiver. If your blood pressure stays high after you have made lifestyle changes or started on medicines,   Your medication(s) may need to be changed.   Other problems may need to be addressed.   Be certain you understand your prescriptions, and know how and when to take your medicine.   Be sure to follow up with your caregiver within the time frame advised (usually within two weeks) to have your blood pressure rechecked and to review your medications.   If you are taking more than one medicine to lower your blood pressure, make sure you know how and at what times they should be taken. Taking two medicines at the same time can result in blood pressure that is too low.  Document Released: 10/07/2005 Document Revised: 04/16/2011 Document Reviewed: 10/14/2007 [facility] Patient Information 2012 [place], [place].

## 2011-11-04 ENCOUNTER — Ambulatory Visit (INDEPENDENT_AMBULATORY_CARE_PROVIDER_SITE_OTHER): Payer: Medicare Other | Admitting: Internal Medicine

## 2011-11-04 VITALS — BP 127/71 | HR 71 | Temp 97.9°F | Resp 14 | Ht 63.0 in | Wt 148.4 lb

## 2011-11-04 DIAGNOSIS — I10 Essential (primary) hypertension: Secondary | ICD-10-CM | POA: Insufficient documentation

## 2011-11-04 DIAGNOSIS — F411 Generalized anxiety disorder: Secondary | ICD-10-CM

## 2011-11-04 DIAGNOSIS — Z7189 Other specified counseling: Secondary | ICD-10-CM

## 2011-11-04 DIAGNOSIS — F419 Anxiety disorder, unspecified: Secondary | ICD-10-CM

## 2011-11-04 MED ORDER — ALPRAZOLAM 0.25 MG PO TABS: 0.2500 mg | ORAL_TABLET | Freq: Two times a day (BID) | ORAL | Status: AC | PRN

## 2011-11-04 NOTE — Progress Notes (Signed)
  Subjective:    Patient ID: Carol Tucker, female    DOB: 05/05/1941, 71 y.o.   MRN: [id number]  HPI 2 days ago had a spell with HA, feeling bad shaking, BP was 178/ 72. No chest pain., SOB,. Syncope, or dizzy. Home BP sincetaken 7x over 2d is normal. No speech changes  Review of Systems Lots of stressful issues for her daughters family, this may be the issue   Objective:   Physical Exam  Constitutional: She is oriented to person, place, and time. She appears well-developed and well-nourished.  HENT:  Right Ear: External ear normal.  Left Ear: External ear normal.  Eyes: EOM are normal. Pupils are equal, round, and reactive to light.  Neck: Normal range of motion. Neck supple. No thyromegaly present.  Cardiovascular: Normal rate, regular rhythm and normal heart sounds.   Pulmonary/Chest: Effort normal and breath sounds normal.  Abdominal: Soft. Bowel sounds are normal.  Neurological: She is oriented to person, place, and time. She has normal strength and normal reflexes. She displays normal reflexes. No cranial nerve deficit or sensory deficit. She displays a negative Romberg sign. Coordination normal.  Reflex Scores:      Tricep reflexes are 2+ on the right side and 2+ on the left side.      Bicep reflexes are 2+ on the right side and 2+ on the left side.      Brachioradialis reflexes are 2+ on the right side and 2+ on the left side.      Patellar reflexes are 2+ on the right side and 2+ on the left side.      Achilles reflexes are 2+ on the right side and 2+ on the left side.      No drift Balance ea leg is normal  Skin: Skin is warm and dry.          Assessment & Plan:  Anxiety reaction Alprazolam .25 prn

## 2011-11-04 NOTE — Patient Instructions (Signed)

## 2011-11-07 NOTE — ED Provider Notes (Signed)
Medical screening examination/treatment/procedure(s) were performed by non-physician practitioner and as supervising physician I was immediately available for consultation/collaboration.  Cyndra Numbers, MD 11/07/11 [phone]

## 2012-02-24 ENCOUNTER — Encounter: Payer: Self-pay | Admitting: Obstetrics and Gynecology

## 2012-02-24 ENCOUNTER — Ambulatory Visit (INDEPENDENT_AMBULATORY_CARE_PROVIDER_SITE_OTHER): Payer: Medicare Other | Admitting: Obstetrics and Gynecology

## 2012-02-24 VITALS — BP 124/76 | Ht 63.0 in | Wt 149.0 lb

## 2012-02-24 DIAGNOSIS — M8580 Other specified disorders of bone density and structure, unspecified site: Secondary | ICD-10-CM

## 2012-02-24 DIAGNOSIS — E785 Hyperlipidemia, unspecified: Secondary | ICD-10-CM | POA: Insufficient documentation

## 2012-02-24 DIAGNOSIS — N952 Postmenopausal atrophic vaginitis: Secondary | ICD-10-CM

## 2012-02-24 DIAGNOSIS — M899 Disorder of bone, unspecified: Secondary | ICD-10-CM

## 2012-02-24 DIAGNOSIS — Z780 Asymptomatic menopausal state: Secondary | ICD-10-CM

## 2012-02-24 DIAGNOSIS — M949 Disorder of cartilage, unspecified: Secondary | ICD-10-CM

## 2012-02-24 DIAGNOSIS — N393 Stress incontinence (female) (male): Secondary | ICD-10-CM

## 2012-02-24 DIAGNOSIS — K219 Gastro-esophageal reflux disease without esophagitis: Secondary | ICD-10-CM | POA: Insufficient documentation

## 2012-02-24 NOTE — Progress Notes (Addendum)
Patient came to see me today for further followup. She does have osteopenia without an elevated fracture risk. Her last bone density was may, 2011. She is taking calcium and vitamin D. She has had no fractures. She had a significant fall last year and did not fracture. She is having a mammogram on Thursday. She does her lab through PCP. She does have atrophic vaginitis but does not require medication as she is not sexually active. She does have some night sweats but they're tolerable without treatment. She has been doing her Kegel exercises regularly and says that urinary stress incontinence that we discussed last year has gone. She is also no longer having nocturia. She has no dysuria or urgency. She is having no vaginal bleeding. She is having no pelvic pain. She has always had normal Pap smears. Her last Pap smear was 2011.  ROS: 12 system review done. Pertinent positives above. Other positives include hyperlipidemia,hypertension, and gerd.  Physical examination:Kim Julian Reil present. HEENT within normal limits. Neck: Thyroid not large. No masses. Supraclavicular nodes: not enlarged. Breasts: Examined in both sitting and lying  position. No skin changes and no masses. Abdomen: Soft no guarding rebound or masses or hernia. Pelvic: External: Within normal limits. BUS: Within normal limits. Vaginal:within normal limits. poor estrogen effect. No evidence of cystocele rectocele or enterocele. Cervix: clean. Uterus: Normal size and shape. Adnexa: No masses. Rectovaginal exam: Confirmatory. External hemorrhoids. Extremities: Within normal limits.  Assessment: #1. Osteopenia #2. Atrophic vaginitis #3. Urinary stress incontinence #4. Menopausal symptoms #5. Asymptomatic external hemorrhoid.  Plan: Mammogram. Follow up bone density in 2014.The new Pap smear guidelines were discussed with the patient. No Pap done.

## 2012-03-04 ENCOUNTER — Encounter: Payer: Self-pay | Admitting: Obstetrics and Gynecology

## 2012-03-17 ENCOUNTER — Other Ambulatory Visit: Payer: Self-pay

## 2012-03-17 MED ORDER — LISINOPRIL 5 MG PO TABS: 5.0000 mg | ORAL_TABLET | Freq: Every day | ORAL | Status: DC

## 2012-03-19 ENCOUNTER — Other Ambulatory Visit: Payer: Self-pay | Admitting: Physician Assistant

## 2012-03-19 MED ORDER — HYDROCHLOROTHIAZIDE 12.5 MG PO CAPS: 12.5000 mg | ORAL_CAPSULE | Freq: Every day | ORAL | Status: DC

## 2012-04-05 ENCOUNTER — Ambulatory Visit (INDEPENDENT_AMBULATORY_CARE_PROVIDER_SITE_OTHER): Payer: Medicare Other | Admitting: Internal Medicine

## 2012-04-05 ENCOUNTER — Encounter: Payer: Self-pay | Admitting: Internal Medicine

## 2012-04-05 VITALS — BP 120/68 | HR 68 | Temp 98.7°F | Resp 16 | Ht 63.0 in | Wt 148.0 lb

## 2012-04-05 DIAGNOSIS — Z0000 Encounter for general adult medical examination without abnormal findings: Secondary | ICD-10-CM

## 2012-04-05 DIAGNOSIS — G43909 Migraine, unspecified, not intractable, without status migrainosus: Secondary | ICD-10-CM | POA: Insufficient documentation

## 2012-04-05 DIAGNOSIS — I10 Essential (primary) hypertension: Secondary | ICD-10-CM

## 2012-04-05 DIAGNOSIS — Z23 Encounter for immunization: Secondary | ICD-10-CM

## 2012-04-05 DIAGNOSIS — Z7189 Other specified counseling: Secondary | ICD-10-CM

## 2012-04-05 LAB — CBC
MCH: 31.8 pg (ref 26.0–34.0)
MCHC: 35.1 g/dL (ref 30.0–36.0)
MCV: 90.6 fL (ref 78.0–100.0)
Platelets: 246 10*3/uL (ref 150–400)
RBC: 4.66 MIL/uL (ref 3.87–5.11)

## 2012-04-05 LAB — POCT UA - MICROSCOPIC ONLY
Crystals, Ur, HPF, POC: NEGATIVE
Epithelial cells, urine per micros: NEGATIVE
Mucus, UA: NEGATIVE

## 2012-04-05 LAB — LIPID PANEL
Cholesterol: 195 mg/dL (ref 0–200)
Total CHOL/HDL Ratio: 3.9 Ratio
Triglycerides: 128 mg/dL (ref <150)
VLDL: 26 mg/dL (ref 0–40)

## 2012-04-05 LAB — COMPREHENSIVE METABOLIC PANEL
ALT: 14 U/L (ref 0–35)
Alkaline Phosphatase: 47 U/L (ref 39–117)
CO2: 29 mEq/L (ref 19–32)
Creat: 0.93 mg/dL (ref 0.50–1.10)
Total Bilirubin: 0.5 mg/dL (ref 0.3–1.2)

## 2012-04-05 NOTE — Progress Notes (Signed)
Subjective:    Patient ID: Carol Tucker, female    DOB: June 27, 1941, 71 y.o.   MRN: [id number]  HPI No problems. Feels good. See scanned hx All issues controlled.  Review of Systems See scanned ros    Objective:   Physical Exam  Constitutional: She is oriented to person, place, and time. She appears well-developed and well-nourished.  HENT:  Right Ear: External ear normal.  Left Ear: External ear normal.  Nose: Nose normal.  Mouth/Throat: Oropharynx is clear and moist.  Eyes: EOM are normal. Pupils are equal, round, and reactive to light.  Neck: Normal range of motion. No  tracheal deviation present. No thyromegaly present.  Cardiovascular: Normal rate, regular rhythm, normal heart sounds and intact distal pulses.   Pulmonary/Chest: Effort normal and breath sounds normal.  Abdominal: Soft. She exhibits no mass. There is no tenderness.  Musculoskeletal: Normal range of motion. She exhibits no tenderness.  Lymphadenopathy:    She has no cervical adenopathy.  Neurological: She is alert and oriented to person, place, and time. She has normal reflexes. No cranial nerve deficit. She exhibits normal muscle tone. Coordination normal.  Skin: Skin is dry. No rash noted.  Psychiatric: She has a normal mood and affect. Her behavior is normal. Judgment and thought content normal.  Had complete gyn exam with Dr. Dianne Dun last week  ekg nl Results for orders placed in visit on 04/05/12  POCT UA - MICROSCOPIC ONLY      Component Value Range   WBC, Ur, HPF, POC neg     RBC, urine, microscopic 3-5     Bacteria, U Microscopic trace     Mucus, UA neg     Epithelial cells, urine per micros neg     Crystals, Ur, HPF, POC neg     Casts, Ur, LPF, POC neg]     Yeast, UA neg           Assessment & Plan:  Shingles vac ordered again. Pneumovax 2nd dose RF meds 1 yr

## 2012-05-28 ENCOUNTER — Other Ambulatory Visit: Payer: Self-pay | Admitting: Physician Assistant

## 2012-06-29 ENCOUNTER — Other Ambulatory Visit: Payer: Self-pay | Admitting: Physician Assistant

## 2012-07-23 ENCOUNTER — Ambulatory Visit: Payer: Medicare Other

## 2012-07-23 ENCOUNTER — Ambulatory Visit (INDEPENDENT_AMBULATORY_CARE_PROVIDER_SITE_OTHER): Payer: Medicare Other | Admitting: Internal Medicine

## 2012-07-23 VITALS — BP 132/74 | HR 76 | Temp 98.0°F | Resp 17 | Ht 63.5 in | Wt 150.0 lb

## 2012-07-23 DIAGNOSIS — M25572 Pain in left ankle and joints of left foot: Secondary | ICD-10-CM

## 2012-07-23 DIAGNOSIS — M109 Gout, unspecified: Secondary | ICD-10-CM

## 2012-07-23 DIAGNOSIS — M25579 Pain in unspecified ankle and joints of unspecified foot: Secondary | ICD-10-CM

## 2012-07-23 LAB — POCT CBC
HCT, POC: 44.2 % (ref 37.7–47.9)
Lymph, poc: 3.2 (ref 0.6–3.4)
MCH, POC: 30.5 pg (ref 27–31.2)
MCHC: 32.1 g/dL (ref 31.8–35.4)
MCV: 95.1 fL (ref 80–97)
MID (cbc): 0.4 (ref 0–0.9)
POC LYMPH PERCENT: 41.9 %L (ref 10–50)
Platelet Count, POC: 314 10*3/uL (ref 142–424)
RDW, POC: 14.2 %
WBC: 7.6 10*3/uL (ref 4.6–10.2)

## 2012-07-23 LAB — POCT SEDIMENTATION RATE: POCT SED RATE: 35 mm/hr — AB (ref 0–22)

## 2012-07-23 IMAGING — CR DG ANKLE COMPLETE 3+V*L*
3 series · 3 of 3 positions shown · non-contrast
Comparison: Preliminary reading of Dr. MESAKI

CLINICAL DATA: Ankle pain

LEFT ANKLE COMPLETE - 3+ VIEW

[AP]
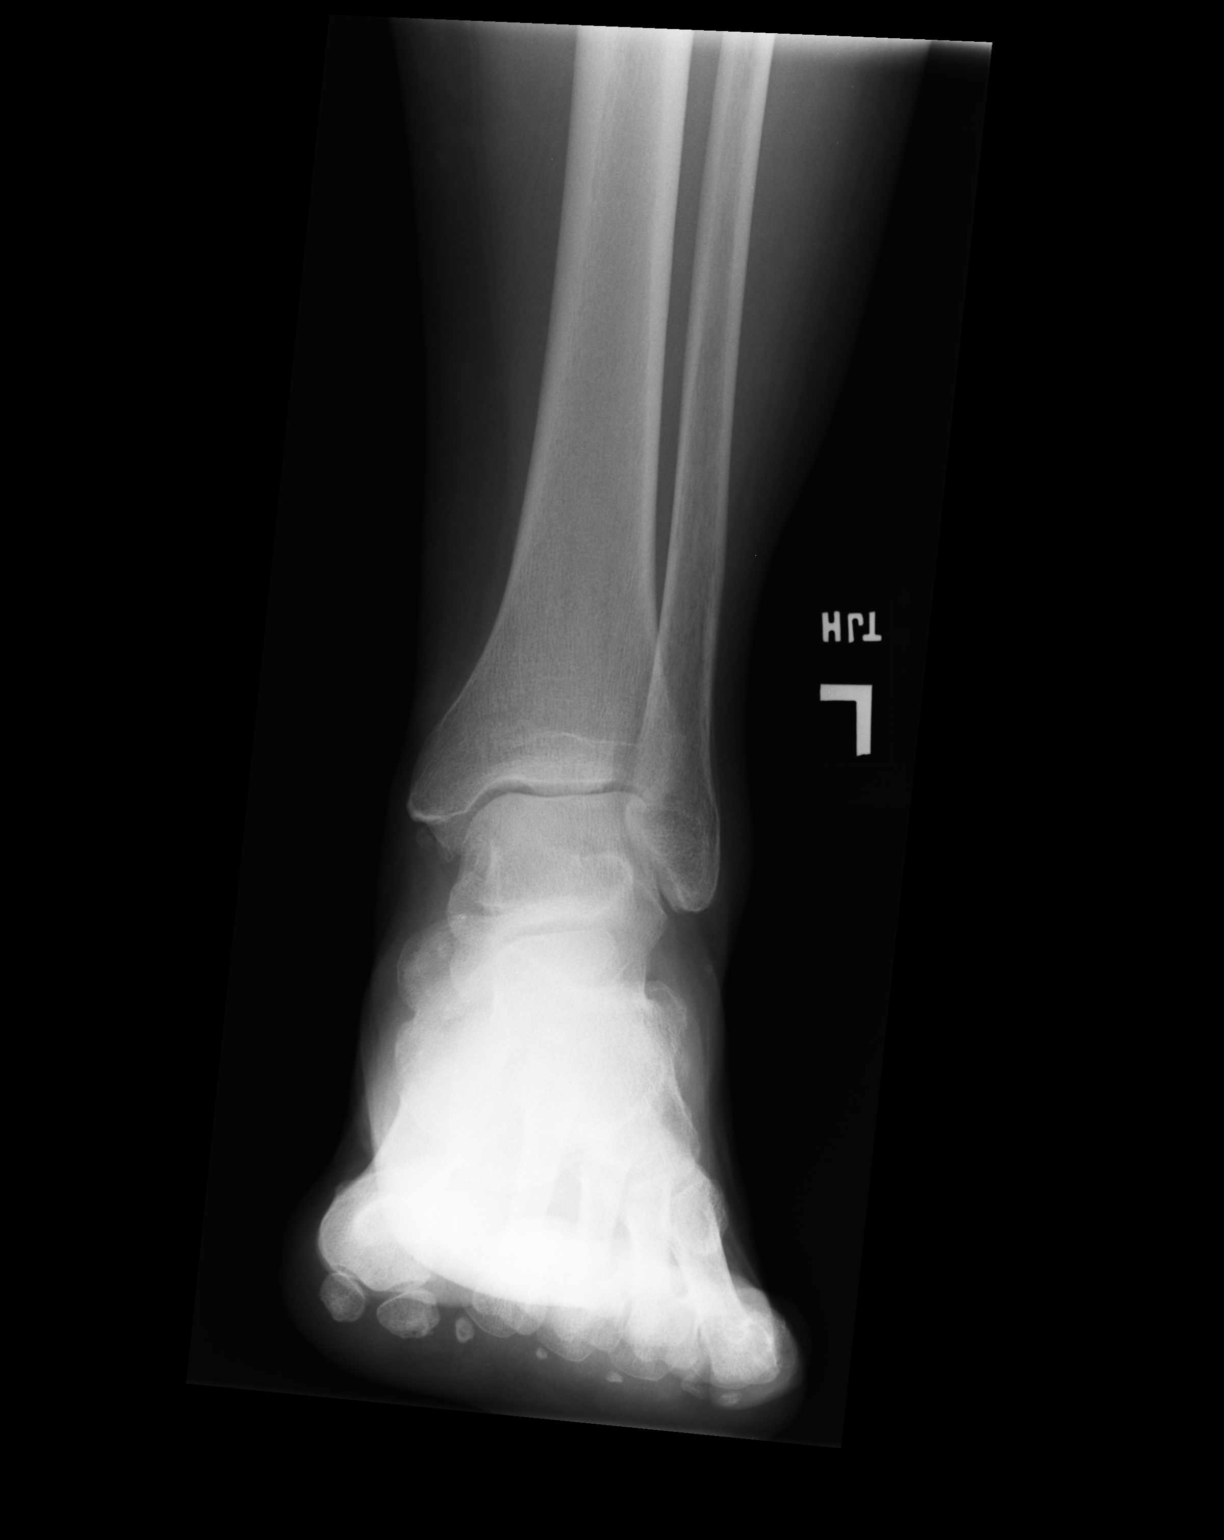

[ap obl int rot]
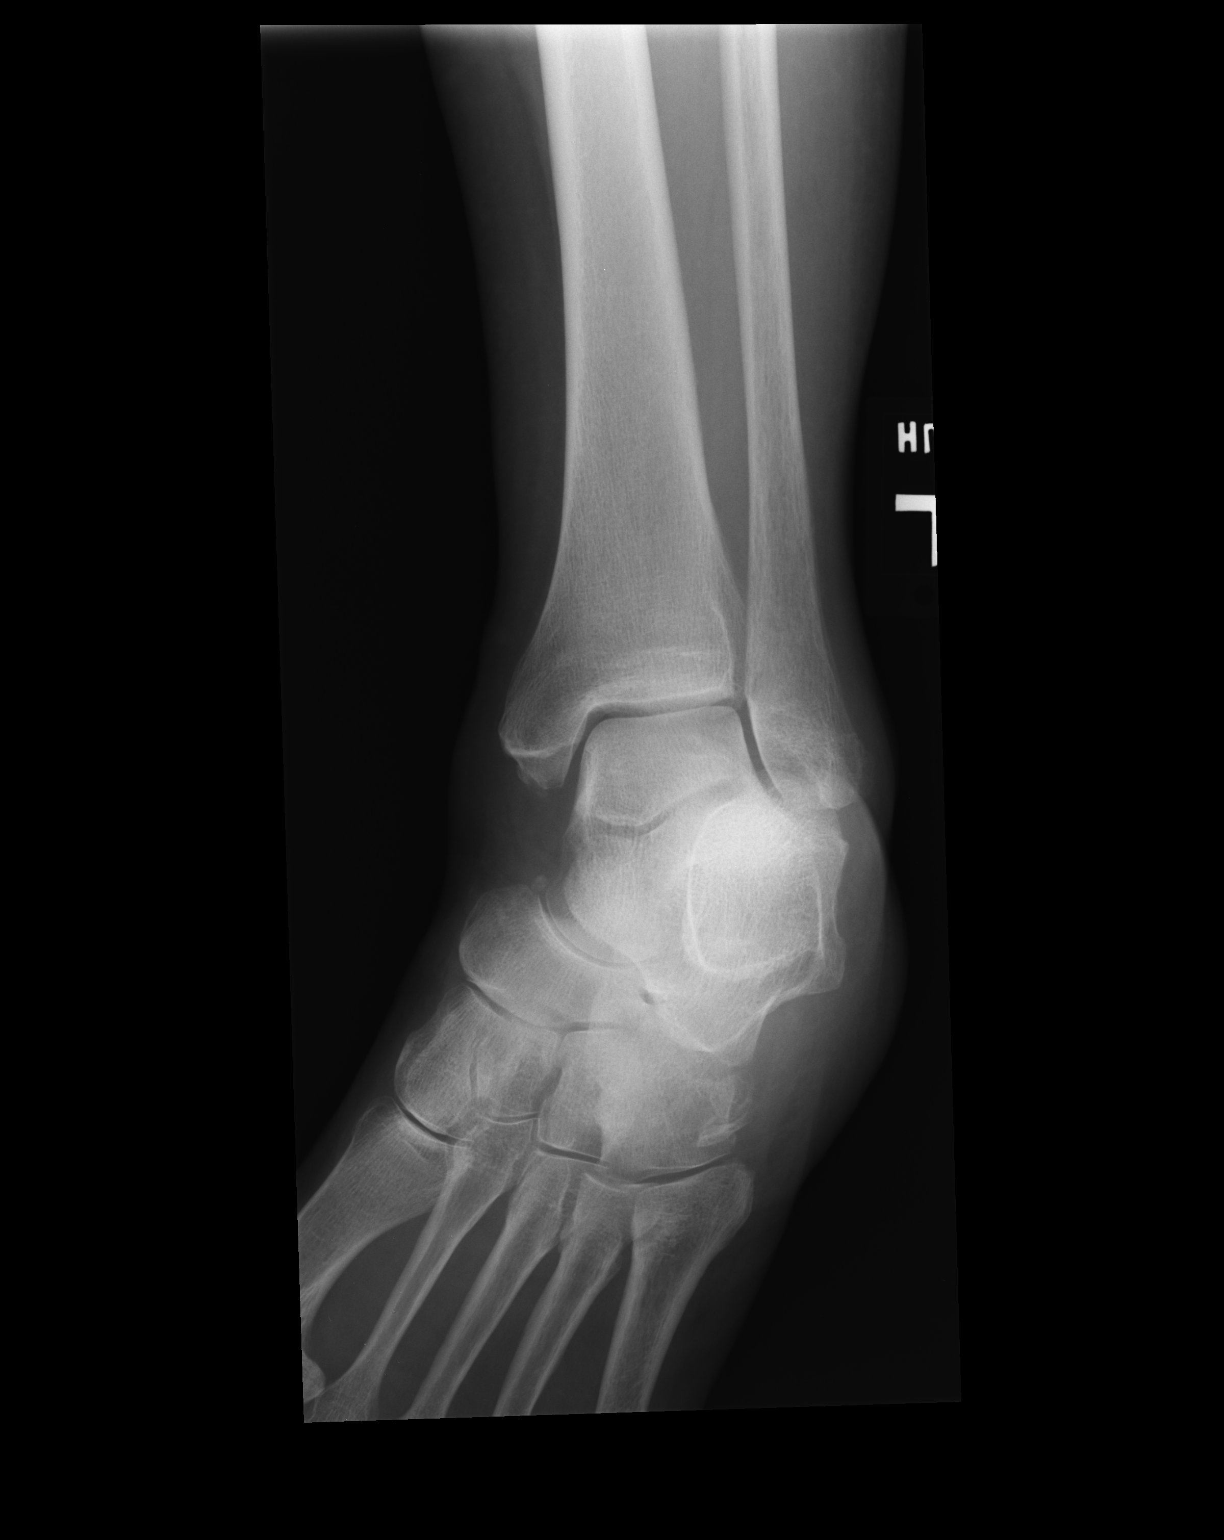

[lateral]
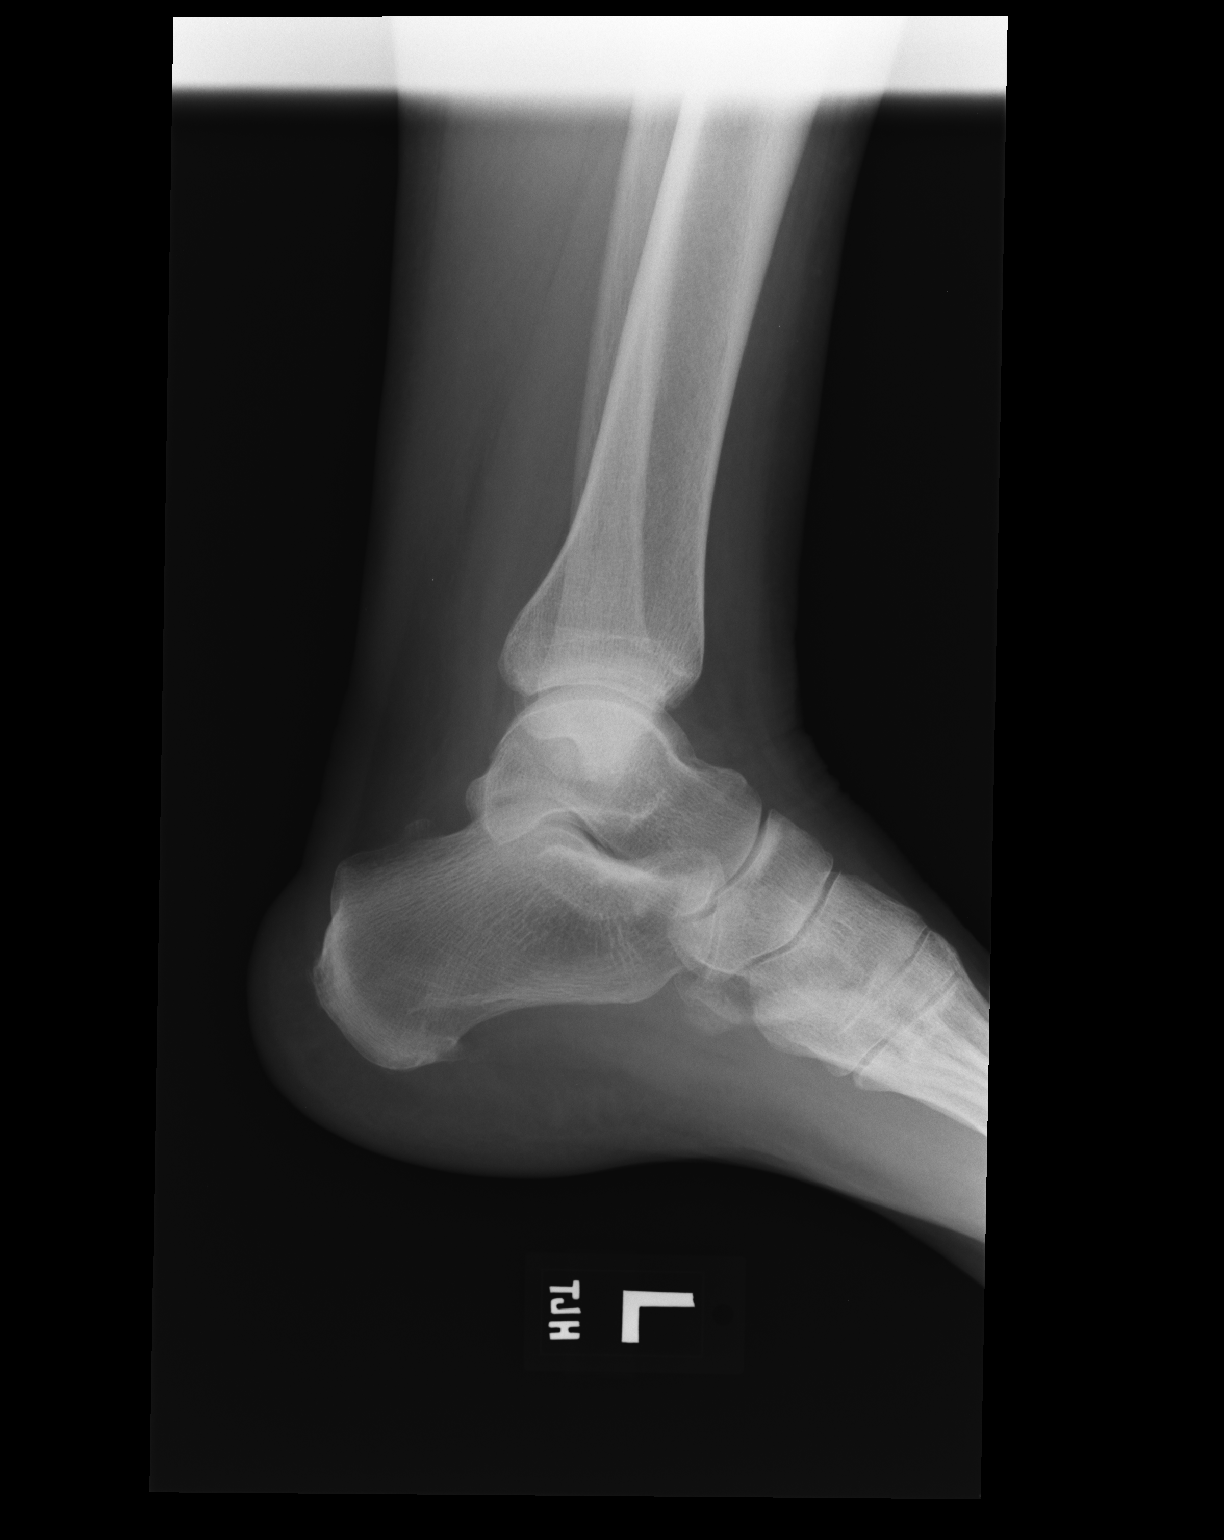

[3 of 3 positions shown; findings below may reference images not displayed]

FINDINGS: Three views of the left ankle submitted. No acute
fracture or subluxation.  Ankle mortise is preserved.  Small
plantar spur of the calcaneus.
IMPRESSION: No acute fracture or subluxation.

## 2012-07-23 MED ORDER — IBUPROFEN 600 MG PO TABS: 600.0000 mg | ORAL_TABLET | Freq: Three times a day (TID) | ORAL | Status: DC | PRN

## 2012-07-23 NOTE — Progress Notes (Signed)
  Subjective:    Patient ID: Carol Tucker, female    DOB: 12-22-1940, 71 y.o.   MRN: [id number]  HPI Has had off and on pain left foot and lateral ankle for few weeks. No past hx of gout, no reddness but has swelled  Motrin helps a lot. On hctz Father hx gout  Review of Systems     Objective:   Physical Exam Left ankle/foot feels warmer than right No reddness, nmsv intact  Ankle lateral mild swelling. Tender at posterior lat malleolus  [facility] reading (PRIMARY) by  Dr.Guest nad. Results for orders placed in visit on 07/23/12  POCT CBC      Component Value Range   WBC 7.6  4.6 - 10.2 K/uL   Lymph, poc 3.2  0.6 - 3.4   POC LYMPH PERCENT 41.9  10 - 50 %L   MID (cbc) 0.4  0 - 0.9   POC MID % 5.0  0 - 12 %M   POC Granulocyte 4.0  2 - 6.9   Granulocyte percent 53.1  37 - 80 %G   RBC 4.65  4.04 - 5.48 M/uL   Hemoglobin 14.2  12.2 - 16.2 g/dL   HCT, POC 29.5  28.4 - 47.9 %   MCV 95.1  80 - 97 fL   MCH, POC 30.5  27 - 31.2 pg   MCHC 32.1  31.8 - 35.4 g/dL   RDW, POC 13.2     Platelet Count, POC 314  142 - 424 K/uL   MPV 9.1  0 - 99.8 fL         Assessment & Plan:  Possible gout attack mild Motrin 600mg

## 2012-07-23 NOTE — Patient Instructions (Addendum)
Gout Gout is an inflammatory condition (arthritis) caused by a buildup of uric acid crystals in the joints. Uric acid is a chemical that is normally present in the blood. Under some circumstances, uric acid can form into crystals in your joints. This causes joint redness, soreness, and swelling (inflammation). Repeat attacks are common. Over time, uric acid crystals can form into masses (tophi) near a joint, causing disfigurement. Gout is treatable and often preventable. CAUSES  The disease begins with elevated levels of uric acid in the blood. Uric acid is produced by your body when it breaks down a naturally found substance called purines. This also happens when you eat certain foods such as meats and fish. Causes of an elevated uric acid level include:  Being passed down from parent to child (heredity).  Diseases that cause increased uric acid production (obesity, psoriasis, some cancers).  Excessive alcohol use.  Diet, especially diets rich in meat and seafood.  Medicines, including certain cancer-fighting drugs (chemotherapy), diuretics, and aspirin.  Chronic kidney disease. The kidneys are no longer able to remove uric acid well.  Problems with metabolism. Conditions strongly associated with gout include:  Obesity.  High blood pressure.  High cholesterol.  Diabetes. Not everyone with elevated uric acid levels gets gout. It is not understood why some people get gout and others do not. Surgery, joint injury, and eating too much of certain foods are some of the factors that can lead to gout. SYMPTOMS   An attack of gout comes on quickly. It causes intense pain with redness, swelling, and warmth in a joint.  Fever can occur.  Often, only one joint is involved. Certain joints are more commonly involved:  Base of the big toe.  Knee.  Ankle.  Wrist.  Finger. Without treatment, an attack usually goes away in a few days to weeks. Between attacks, you usually will not have  symptoms, which is different from many other forms of arthritis. DIAGNOSIS  Your caregiver will suspect gout based on your symptoms and exam. Removal of fluid from the joint (arthrocentesis) is done to check for uric acid crystals. Your caregiver will give you a medicine that numbs the area (local anesthetic) and use a needle to remove joint fluid for exam. Gout is confirmed when uric acid crystals are seen in joint fluid, using a special microscope. Sometimes, blood, urine, and X-ray tests are also used. TREATMENT  There are 2 phases to gout treatment: treating the sudden onset (acute) attack and preventing attacks (prophylaxis). Treatment of an Acute Attack  Medicines are used. These include anti-inflammatory medicines or steroid medicines.  An injection of steroid medicine into the affected joint is sometimes necessary.  The painful joint is rested. Movement can worsen the arthritis.  You may use warm or cold treatments on painful joints, depending which works best for you.  Discuss the use of coffee, vitamin C, or cherries with your caregiver. These may be helpful treatment options. Treatment to Prevent Attacks After the acute attack subsides, your caregiver may advise prophylactic medicine. These medicines either help your kidneys eliminate uric acid from your body or decrease your uric acid production. You may need to stay on these medicines for a very long time. The early phase of treatment with prophylactic medicine can be associated with an increase in acute gout attacks. For this reason, during the first few months of treatment, your caregiver may also advise you to take medicines usually used for acute gout treatment. Be sure you understand your caregiver's directions.   You should also discuss dietary treatment with your caregiver. Certain foods such as meats and fish can increase uric acid levels. Other foods such as dairy can decrease levels. Your caregiver can give you a list of foods  to avoid. HOME CARE INSTRUCTIONS   Do not take aspirin to relieve pain. This raises uric acid levels.  Only take over-the-counter or prescription medicines for pain, discomfort, or fever as directed by your caregiver.  Rest the joint as much as possible. When in bed, keep sheets and blankets off painful areas.  Keep the affected joint raised (elevated).  Use crutches if the painful joint is in your leg.  Drink enough water and fluids to keep your urine clear or pale yellow. This helps your body get rid of uric acid. Do not drink alcoholic beverages. They slow the passage of uric acid.  Follow your caregiver's dietary instructions. Pay careful attention to the amount of protein you eat. Your daily diet should emphasize fruits, vegetables, whole grains, and fat-free or low-fat milk products.  Maintain a healthy body weight. SEEK MEDICAL CARE IF:   You have an oral temperature above 102 F (38.9 C).  You develop diarrhea, vomiting, or any side effects from medicines.  You do not feel better in 24 hours, or you are getting worse. SEEK IMMEDIATE MEDICAL CARE IF:   Your joint becomes suddenly more tender and you have:  Chills.  An oral temperature above 102 F (38.9 C), not controlled by medicine. MAKE SURE YOU:   Understand these instructions.  Will watch your condition.  Will get help right away if you are not doing well or get worse. Document Released: 08/01/2000 Document Revised: 10/27/2011 Document Reviewed: 11/12/2009 [employer] Patient Information 2013 [employer].    

## 2012-08-29 ENCOUNTER — Other Ambulatory Visit: Payer: Self-pay | Admitting: Physician Assistant

## 2012-10-04 ENCOUNTER — Ambulatory Visit: Payer: Medicare Other | Admitting: Internal Medicine

## 2012-10-29 ENCOUNTER — Ambulatory Visit (INDEPENDENT_AMBULATORY_CARE_PROVIDER_SITE_OTHER): Payer: 59 | Admitting: Family Medicine

## 2012-10-29 ENCOUNTER — Encounter: Payer: Self-pay | Admitting: Family Medicine

## 2012-10-29 VITALS — BP 127/73 | HR 68 | Temp 97.0°F | Resp 16 | Ht 63.0 in | Wt 148.0 lb

## 2012-10-29 DIAGNOSIS — M8580 Other specified disorders of bone density and structure, unspecified site: Secondary | ICD-10-CM

## 2012-10-29 DIAGNOSIS — R7309 Other abnormal glucose: Secondary | ICD-10-CM

## 2012-10-29 DIAGNOSIS — M949 Disorder of cartilage, unspecified: Secondary | ICD-10-CM

## 2012-10-29 DIAGNOSIS — E785 Hyperlipidemia, unspecified: Secondary | ICD-10-CM

## 2012-10-29 DIAGNOSIS — I10 Essential (primary) hypertension: Secondary | ICD-10-CM

## 2012-10-29 DIAGNOSIS — M899 Disorder of bone, unspecified: Secondary | ICD-10-CM

## 2012-10-29 DIAGNOSIS — Z79899 Other long term (current) drug therapy: Secondary | ICD-10-CM

## 2012-10-29 DIAGNOSIS — K219 Gastro-esophageal reflux disease without esophagitis: Secondary | ICD-10-CM

## 2012-10-29 LAB — COMPREHENSIVE METABOLIC PANEL
AST: 12 U/L (ref 0–37)
Albumin: 4.5 g/dL (ref 3.5–5.2)
Alkaline Phosphatase: 54 U/L (ref 39–117)
BUN: 18 mg/dL (ref 6–23)
Potassium: 4.2 mEq/L (ref 3.5–5.3)
Sodium: 134 mEq/L — ABNORMAL LOW (ref 135–145)

## 2012-10-29 LAB — LIPID PANEL
Cholesterol: 205 mg/dL — ABNORMAL HIGH (ref 0–200)
LDL Cholesterol: 134 mg/dL — ABNORMAL HIGH (ref 0–99)
Total CHOL/HDL Ratio: 4.9 Ratio
VLDL: 29 mg/dL (ref 0–40)

## 2012-10-29 MED ORDER — HYDROCHLOROTHIAZIDE 12.5 MG PO CAPS: ORAL_CAPSULE | ORAL | Status: DC

## 2012-10-29 NOTE — Patient Instructions (Addendum)
DASH Diet The DASH diet stands for "Dietary Approaches to Stop Hypertension." It is a healthy eating plan that has been shown to reduce high blood pressure (hypertension) in as little as 14 days, while also possibly providing other significant health benefits. These other health benefits include reducing the risk of breast cancer after menopause and reducing the risk of type 2 diabetes, heart disease, colon cancer, and stroke. Health benefits also include weight loss and slowing kidney failure in patients with chronic kidney disease.  DIET GUIDELINES  Limit salt (sodium). Your diet should contain less than 1500 mg of sodium daily.  Limit refined or processed carbohydrates. Your diet should include mostly whole grains. Desserts and added sugars should be used sparingly.  Include small amounts of heart-healthy fats. These types of fats include nuts, oils, and tub margarine. Limit saturated and trans fats. These fats have been shown to be harmful in the body. CHOOSING FOODS  The following food groups are based on a 2000 calorie diet. See your Registered Dietitian for individual calorie needs. Grains and Grain Products (6 to 8 servings daily)  Eat More Often: Whole-wheat bread, brown rice, whole-grain or wheat pasta, quinoa, popcorn without added fat or salt (air popped).  Eat Less Often: White bread, white pasta, white rice, cornbread. Vegetables (4 to 5 servings daily)  Eat More Often: Fresh, frozen, and canned vegetables. Vegetables may be raw, steamed, roasted, or grilled with a minimal amount of fat.  Eat Less Often/Avoid: Creamed or fried vegetables. Vegetables in a cheese sauce. Fruit (4 to 5 servings daily)  Eat More Often: All fresh, canned (in natural juice), or frozen fruits. Dried fruits without added sugar. One hundred percent fruit juice ( cup [237 mL] daily).  Eat Less Often: Dried fruits with added sugar. Canned fruit in light or heavy syrup. [employer], Fish, and Poultry (2  servings or less daily. One serving is 3 to 4 oz [85-114 g]).  Eat More Often: Ninety percent or leaner ground beef, tenderloin, sirloin. Round cuts of beef, chicken breast, [place] breast. All fish. Grill, bake, or broil your meat. Nothing should be fried.  Eat Less Often/Avoid: Fatty cuts of meat, [place], or chicken leg, thigh, or wing. Fried cuts of meat or fish. Dairy (2 to 3 servings)  Eat More Often: Low-fat or fat-free milk, low-fat plain or light yogurt, reduced-fat or part-skim cheese.  Eat Less Often/Avoid: Milk (whole, 2%).Whole milk yogurt. Full-fat cheeses. Nuts, Seeds, and Legumes (4 to 5 servings per week)  Eat More Often: All without added salt.  Eat Less Often/Avoid: Salted nuts and seeds, canned beans with added salt. Fats and Sweets (limited)  Eat More Often: Vegetable oils, tub margarines without trans fats, sugar-free gelatin. Mayonnaise and salad dressings.  Eat Less Often/Avoid: Coconut oils, palm oils, butter, stick margarine, cream, half and half, cookies, candy, pie. FOR MORE INFORMATION The Dash Diet Eating Plan: www.dashdiet.org Document Released: 07/24/2011 Document Revised: 10/27/2011 Document Reviewed: 07/24/2011 [facility] Patient Information 2013 [place], [place].   Foods Rich in Potassium Food / Potassium (mg)  Apricots, dried,  cup / 378 mg   Apricots, raw, 1 cup halves / 401 mg   Avocado,  / 487 mg   Banana, 1 large / 487 mg   Beef, lean, round, 3 oz / 202 mg   Cantaloupe, 1 cup cubes / 427 mg   Dates, medjool, 5 whole / 835 mg   Ham, cured, 3 oz / 212 mg   Lentils, dried,  cup / 458  mg   Lima beans, frozen,  cup / 258 mg   [place], 1 large / 333 mg   [place]ange juice, 1 cup / 443 mg   Peaches, dried,  cup / 398 mg   Peas, split, cooked,  cup / 355 mg   Potato, boiled, 1 medium / 515 mg   Prunes, dried, uncooked,  cup / 318 mg   Raisins,  cup / 309 mg   Salmon, pink, raw, 3 oz / 275 mg   Sardines, canned , 3 oz / 338  mg   Tomato, raw, 1 medium / 292 mg   Tomato juice, 6 oz / 417 mg   [place], 3 oz / 349 mg  Document Released: 08/04/2005 Document Revised: 04/16/2011 Document Reviewed: 12/18/2008 [facility] Patient Information 2012 [place], [place].

## 2012-10-29 NOTE — Progress Notes (Signed)
Subjective:    Patient ID: Carol Tucker, female    DOB: 09/25/1940, 72 y.o.   MRN: [id number]  HPI  Quit taking pravastatin about 2 almost 3 months ago as wasn't sure if she really needed it and was worried that it could be causing DM without actually helping her due to her minimal risk factors. Would like to minimize the # of pills she is taking.  Sev yrs ago, she was started on low dose lisinopril as her BP was mildly elevated just on the low dose hctz but thinks that might have been an isolated incident and would rather be on a medium dose of one medicine or a combo pill than taking 2. GERD sxs well controlled w/ pepcid. Not supplementing with calcium or vit D. Last DEXA was 2010. Was seeing gyn yearly but no complications, doesn't need paps anymore - has never had any gynecological surgeries and has not concerns or complaints. Her gynecologist just retired so for now she will stop seeing gyn and let us, her PCP, due her breast exam, mammograms, etc.  Past Medical History  Diagnosis Date  . Hypertension   . Hyperlipidemia   . GERD (gastroesophageal reflux disease)   . Osteopenia 8-04  . Allergy   . Cancer   . Depression   . Anxiety    Current Outpatient Prescriptions on File Prior to Visit  Medication Sig Dispense Refill  . acetaminophen (TYLENOL) 500 MG tablet Take 1,000 mg by mouth every 6 (six) hours as needed. For pain      . aspirin EC 81 MG tablet Take 81 mg by mouth daily.      . famotidine (PEPCID) 20 MG tablet Take 20 mg by mouth 2 (two) times daily.      . hydrochlorothiazide (MICROZIDE) 12.5 MG capsule TAKE ONE CAPSULE BY MOUTH ONE TIME DAILY  30 capsule  7  . ibuprofen (ADVIL,MOTRIN) 600 MG tablet Take 1 tablet (600 mg total) by mouth every 8 (eight) hours as needed for pain.  30 tablet  0  . lisinopril (PRINIVIL,ZESTRIL) 5 MG tablet TAKE ONE TABLET BY MOUTH ONE TIME DAILY  30 tablet  1  . Multiple Vitamin (MULITIVITAMIN WITH MINERALS) TABS Take 1 tablet by mouth  daily.      . naphazoline (CLEAR EYES) 0.012 % ophthalmic solution Place 1 drop into both eyes 4 (four) times daily as needed. For dry eyes      . pravastatin (PRAVACHOL) 20 MG tablet Take 20 mg by mouth daily.       No current facility-administered medications on file prior to visit.   Allergies  Allergen Reactions  . Metoclopramide Hcl     REACTION: tremors, clinched jaws     REGLAN   Family History  Problem Relation Age of Onset  . Hypertension Father   . Heart disease Father   . Cancer Father     BLADDER & LUNG  . Heart disease Paternal Grandmother   . Alzheimer's disease Mother   . Heart disease Brother   . Heart disease Paternal Grandfather   . Diabetes Paternal Grandfather      Review of Systems  Constitutional: Negative for fever, chills, diaphoresis and appetite change.  Respiratory: Negative for cough and shortness of breath.   Cardiovascular: Negative for chest pain, palpitations and leg swelling.  Endocrine: Negative for polyuria.  Genitourinary: Negative for urgency, frequency, decreased urine volume, enuresis and difficulty urinating.  Neurological: Negative for syncope.  Hematological: Does not bruise/bleed easily.  BP 127/73  Pulse 68  Temp(Src) 97 F (36.1 C) (Oral)  Resp 16  Ht 5\' 3"  (1.6 m)  Wt 148 lb (67.132 kg)  BMI 26.22 kg/m2  SpO2 96%' Objective:   Physical Exam  Constitutional: She is oriented to person, place, and time. She appears well-developed and well-nourished. No distress.  HENT:  Head: Normocephalic and atraumatic.  Right Ear: External ear normal.  Left Ear: External ear normal.  Eyes: Conjunctivae are normal. No scleral icterus.  Neck: Normal range of motion. Neck supple. No thyromegaly present.  Cardiovascular: Normal rate, regular rhythm, normal heart sounds and intact distal pulses.   Pulmonary/Chest: Effort normal and breath sounds normal. No respiratory distress.  Musculoskeletal: She exhibits no edema.  Lymphadenopathy:     She has no cervical adenopathy.  Neurological: She is alert and oriented to person, place, and time.  Skin: Skin is warm and dry. She is not diaphoretic. No erythema.  Psychiatric: She has a normal mood and affect. Her behavior is normal.      Results for orders placed in visit on 10/29/12  POCT GLYCOSYLATED HEMOGLOBIN (HGB A1C)      Result Value Range   Hemoglobin A1C 5.6      Assessment & Plan:  Elevated glucose - Plan: POCT glycosylated hemoglobin (Hb A1C) - prev a1c 6.1 in 2010 so pt is doing fantastic!!!  Encounter for long-term (current) use of other medications - Plan: Comprehensive metabolic panel  Hyperlipidemia LDL goal < 130 - Plan: Lipid Panel - off pravastatin x >2 mos.  Consider not restarting if close to goal due to pre-DM  GERD (gastroesophageal reflux disease) - cont pepcid  HTN (hypertension) - d/c lisinopril and just cont hctz. If BP >140/90, increase hctz to 25mg  daily. Cont high potassium diet.  Osteopenia - Plan: DG Bone Density, Vitamin D, 25-hydroxy - recheck dexa at [facility] with next mammogram in July 2014. Start calcium/vit D suppl  Meds ordered this encounter  Medications  . hydrochlorothiazide (MICROZIDE) 12.5 MG capsule    Sig: TAKE ONE CAPSULE BY MOUTH ONE TIME DAILY    Dispense:  90 capsule    Refill:  1   Norberto Sorenson, MD MPH

## 2012-10-30 ENCOUNTER — Encounter: Payer: Self-pay | Admitting: Internal Medicine

## 2012-10-31 ENCOUNTER — Other Ambulatory Visit: Payer: Self-pay | Admitting: Family Medicine

## 2012-10-31 MED ORDER — FAMOTIDINE 20 MG PO TABS: 20.0000 mg | ORAL_TABLET | Freq: Two times a day (BID) | ORAL | Status: DC

## 2012-11-01 ENCOUNTER — Encounter: Payer: Medicare Other | Admitting: Internal Medicine

## 2013-04-20 ENCOUNTER — Encounter: Payer: Self-pay | Admitting: Internal Medicine

## 2013-04-25 ENCOUNTER — Other Ambulatory Visit: Payer: Self-pay | Admitting: Family Medicine

## 2013-04-27 ENCOUNTER — Encounter: Payer: Self-pay | Admitting: Internal Medicine

## 2013-04-27 NOTE — Telephone Encounter (Signed)
Due for labs

## 2013-05-02 ENCOUNTER — Encounter: Payer: Self-pay | Admitting: Internal Medicine

## 2013-05-06 ENCOUNTER — Encounter: Payer: Self-pay | Admitting: Family Medicine

## 2013-05-06 ENCOUNTER — Ambulatory Visit (INDEPENDENT_AMBULATORY_CARE_PROVIDER_SITE_OTHER): Payer: Medicare Other | Admitting: Family Medicine

## 2013-05-06 VITALS — BP 138/66 | HR 70 | Temp 97.8°F | Resp 16 | Ht 63.0 in | Wt 147.0 lb

## 2013-05-06 DIAGNOSIS — Z23 Encounter for immunization: Secondary | ICD-10-CM

## 2013-05-06 DIAGNOSIS — M8580 Other specified disorders of bone density and structure, unspecified site: Secondary | ICD-10-CM

## 2013-05-06 DIAGNOSIS — E785 Hyperlipidemia, unspecified: Secondary | ICD-10-CM

## 2013-05-06 DIAGNOSIS — Z79899 Other long term (current) drug therapy: Secondary | ICD-10-CM

## 2013-05-06 DIAGNOSIS — K219 Gastro-esophageal reflux disease without esophagitis: Secondary | ICD-10-CM

## 2013-05-06 DIAGNOSIS — R7309 Other abnormal glucose: Secondary | ICD-10-CM

## 2013-05-06 DIAGNOSIS — I10 Essential (primary) hypertension: Secondary | ICD-10-CM

## 2013-05-06 LAB — COMPREHENSIVE METABOLIC PANEL
Albumin: 4.3 g/dL (ref 3.5–5.2)
Alkaline Phosphatase: 51 U/L (ref 39–117)
BUN: 15 mg/dL (ref 6–23)
Calcium: 9.3 mg/dL (ref 8.4–10.5)
Chloride: 104 mEq/L (ref 96–112)
Glucose, Bld: 102 mg/dL — ABNORMAL HIGH (ref 70–99)
Potassium: 3.9 mEq/L (ref 3.5–5.3)

## 2013-05-06 LAB — LIPID PANEL
HDL: 39 mg/dL — ABNORMAL LOW (ref >39)
Triglycerides: 191 mg/dL — ABNORMAL HIGH (ref <150)

## 2013-05-06 MED ORDER — HYDROCHLOROTHIAZIDE 25 MG PO TABS: 25.0000 mg | ORAL_TABLET | Freq: Every day | ORAL | Status: DC

## 2013-05-06 NOTE — Patient Instructions (Signed)
DASH Diet  The DASH diet stands for "Dietary Approaches to Stop Hypertension." It is a healthy eating plan that has been shown to reduce high blood pressure (hypertension) in as little as 14 days, while also possibly providing other significant health benefits. These other health benefits include reducing the risk of breast cancer after menopause and reducing the risk of type 2 diabetes, heart disease, colon cancer, and stroke. Health benefits also include weight loss and slowing kidney failure in patients with chronic kidney disease.   DIET GUIDELINES  · Limit salt (sodium). Your diet should contain less than 1500 mg of sodium daily.  · Limit refined or processed carbohydrates. Your diet should include mostly whole grains. Desserts and added sugars should be used sparingly.  · Include small amounts of heart-healthy fats. These types of fats include nuts, oils, and tub margarine. Limit saturated and trans fats. These fats have been shown to be harmful in the body.  CHOOSING FOODS   The following food groups are based on a 2000 calorie diet. See your Registered Dietitian for individual calorie needs.  Grains and Grain Products (6 to 8 servings daily)  · Eat More Often: Whole-wheat bread, brown rice, whole-grain or wheat pasta, quinoa, popcorn without added fat or salt (air popped).  · Eat Less Often: White bread, white pasta, white rice, cornbread.  Vegetables (4 to 5 servings daily)  · Eat More Often: Fresh, frozen, and canned vegetables. Vegetables may be raw, steamed, roasted, or grilled with a minimal amount of fat.  · Eat Less Often/Avoid: Creamed or fried vegetables. Vegetables in a cheese sauce.  Fruit (4 to 5 servings daily)  · Eat More Often: All fresh, canned (in natural juice), or frozen fruits. Dried fruits without added sugar. One hundred percent fruit juice (½ cup [237 mL] daily).  · Eat Less Often: Dried fruits with added sugar. Canned fruit in light or heavy syrup.  Lean Meats, Fish, and Poultry (2  servings or less daily. One serving is 3 to 4 oz [85-114 g]).  · Eat More Often: Ninety percent or leaner ground beef, tenderloin, sirloin. Round cuts of beef, chicken breast, turkey breast. All fish. Grill, bake, or broil your meat. Nothing should be fried.  · Eat Less Often/Avoid: Fatty cuts of meat, turkey, or chicken leg, thigh, or wing. Fried cuts of meat or fish.  Dairy (2 to 3 servings)  · Eat More Often: Low-fat or fat-free milk, low-fat plain or light yogurt, reduced-fat or part-skim cheese.  · Eat Less Often/Avoid: Milk (whole, 2%). Whole milk yogurt. Full-fat cheeses.  Nuts, Seeds, and Legumes (4 to 5 servings per week)  · Eat More Often: All without added salt.  · Eat Less Often/Avoid: Salted nuts and seeds, canned beans with added salt.  Fats and Sweets (limited)  · Eat More Often: Vegetable oils, tub margarines without trans fats, sugar-free gelatin. Mayonnaise and salad dressings.  · Eat Less Often/Avoid: Coconut oils, palm oils, butter, stick margarine, cream, half and half, cookies, candy, pie.  FOR MORE INFORMATION  The Dash Diet Eating Plan: www.dashdiet.org  Document Released: 07/24/2011 Document Revised: 10/27/2011 Document Reviewed: 07/24/2011  ExitCare® Patient Information ©2014 [employer].

## 2013-05-06 NOTE — Progress Notes (Signed)
Subjective:    Patient ID: Carol Tucker, female    DOB: [birth year], 72 y.o.   MRN: [id number]  HPI  Checking BP once a week and systolic usually running low 130s with diastolic in 60s.  Fasting today.  Forgot to get DEXA scan this yr - gets mammogram every summer - unsure when prior done - around 2010 to 2013 at [place] - we do not have records.  Has been taking vitamin D but no calcium   Colonoscopy scheduled with Dr. Marina Goodell on Nov 10.  Past Medical History  Diagnosis Date  . Hypertension   . Hyperlipidemia   . GERD (gastroesophageal reflux disease)   . Osteopenia 8-04  . Allergy   . Cancer   . Depression   . Anxiety     Current Outpatient Prescriptions on File Prior to Visit  Medication Sig Dispense Refill  . acetaminophen (TYLENOL) 500 MG tablet Take 1,000 mg by mouth every 6 (six) hours as needed. For pain      . aspirin EC 81 MG tablet Take 81 mg by mouth daily.      . famotidine (PEPCID) 20 MG tablet Take 1 tablet (20 mg total) by mouth 2 (two) times daily.  60 tablet  11  . hydrochlorothiazide (MICROZIDE) 12.5 MG capsule Take 1 capsule (12.5 mg total) by mouth every morning. NEED LABS!  90 capsule  0  . ibuprofen (ADVIL,MOTRIN) 600 MG tablet Take 1 tablet (600 mg total) by mouth every 8 (eight) hours as needed for pain.  30 tablet  0  . Multiple Vitamin (MULITIVITAMIN WITH MINERALS) TABS Take 1 tablet by mouth daily.      . naphazoline (CLEAR EYES) 0.012 % ophthalmic solution Place 1 drop into both eyes 4 (four) times daily as needed. For dry eyes       No current facility-administered medications on file prior to visit.   Allergies  Allergen Reactions  . Metoclopramide Hcl     REACTION: tremors, clinched jaws     REGLAN    Review of Systems  Constitutional: Negative for fever, chills, diaphoresis and appetite change.  Eyes: Negative for visual disturbance.  Respiratory: Negative for cough and shortness of breath.   Cardiovascular: Positive for leg swelling.  Negative for chest pain and palpitations.       Painful varicose veins  Genitourinary: Negative for decreased urine volume.  Neurological: Negative for syncope and headaches.  Hematological: Does not bruise/bleed easily.      BP 138/66  Pulse 70  Temp(Src) 97.8 F (36.6 C) (Oral)  Resp 16  Ht 5\' 3"  (1.6 m)  Wt 147 lb (66.679 kg)  BMI 26.05 kg/m2  SpO2 97% Objective:   Physical Exam  Constitutional: She is oriented to person, place, and time. She appears well-developed and well-nourished. No distress.  HENT:  Head: Normocephalic and atraumatic.  Right Ear: External ear normal.  Left Ear: External ear normal.  Eyes: Conjunctivae are normal. No scleral icterus.  Neck: Normal range of motion. Neck supple. No thyromegaly present.  Cardiovascular: Normal rate, regular rhythm, normal heart sounds and intact distal pulses.   Pulmonary/Chest: Effort normal and breath sounds normal. No respiratory distress.  Musculoskeletal: She exhibits no edema.  Lymphadenopathy:    She has no cervical adenopathy.  Neurological: She is alert and oriented to person, place, and time.  Skin: Skin is warm and dry. She is not diaphoretic. No erythema.  Psychiatric: She has a normal mood and affect. Her behavior is normal.  Assessment & Plan:  Need for prophylactic vaccination and inoculation against influenza - Plan: Flu Vaccine QUAD 36+ mos IM, Lipid panel  Hyperlipidemia - diet controlled, recheck  HTN (hypertension) - increase hctz from 12.5 to 25, cont checking outside of the office.  GERD (gastroesophageal reflux disease)- cont prn famotidine  Osteopenia - has not had a dexa in sev yrs. Cont vit D supp, start ca supp, check dexa with mammogram next year - remind pt at f/u.  Encounter for long-term (current) use of other medications - Plan: POCT glycosylated hemoglobin (Hb A1C), Comprehensive metabolic panel - h/o pre-DM though resolved 6 mo prior, recheck  Meds ordered this  encounter  Medications  . cholecalciferol (VITAMIN D) 1000 UNITS tablet    Sig: Take 1,000 Units by mouth 2 (two) times daily.  . hydrochlorothiazide (HYDRODIURIL) 25 MG tablet    Sig: Take 1 tablet (25 mg total) by mouth daily.    Dispense:  90 tablet    Refill:  2

## 2013-05-08 ENCOUNTER — Encounter: Payer: Self-pay | Admitting: Internal Medicine

## 2013-05-08 DIAGNOSIS — E785 Hyperlipidemia, unspecified: Secondary | ICD-10-CM

## 2013-05-08 DIAGNOSIS — Z79899 Other long term (current) drug therapy: Secondary | ICD-10-CM

## 2013-05-16 ENCOUNTER — Encounter: Payer: Self-pay | Admitting: Family Medicine

## 2013-06-02 ENCOUNTER — Encounter: Payer: Self-pay | Admitting: Internal Medicine

## 2013-06-03 MED ORDER — LISINOPRIL-HYDROCHLOROTHIAZIDE 10-12.5 MG PO TABS: 1.0000 | ORAL_TABLET | Freq: Every day | ORAL | Status: DC

## 2013-06-13 ENCOUNTER — Ambulatory Visit (AMBULATORY_SURGERY_CENTER): Payer: Self-pay

## 2013-06-13 VITALS — Ht 63.5 in | Wt 145.0 lb

## 2013-06-13 DIAGNOSIS — Z860100 Personal history of colon polyps, unspecified: Secondary | ICD-10-CM

## 2013-06-13 MED ORDER — MOVIPREP 100 G PO SOLR: 1.0000 | Freq: Once | ORAL | Status: DC

## 2013-06-19 ENCOUNTER — Encounter: Payer: Self-pay | Admitting: Family Medicine

## 2013-06-27 ENCOUNTER — Encounter: Payer: Self-pay | Admitting: Internal Medicine

## 2013-06-27 ENCOUNTER — Ambulatory Visit (AMBULATORY_SURGERY_CENTER): Payer: Medicare Other | Admitting: Internal Medicine

## 2013-06-27 VITALS — BP 123/60 | HR 63 | Temp 97.6°F | Resp 14 | Ht 63.5 in | Wt 145.0 lb

## 2013-06-27 DIAGNOSIS — Z86010 Personal history of colonic polyps: Secondary | ICD-10-CM

## 2013-06-27 MED ORDER — SODIUM CHLORIDE 0.9 % IV SOLN: 500.0000 mL | INTRAVENOUS | Status: DC

## 2013-06-27 NOTE — Op Note (Signed)
[facility] [address] [place], [address]   COLONOSCOPY PROCEDURE REPORT  PATIENT: Carol Tucker, Carol Tucker  MR#: [id number] BIRTHDATE: Jul 27, 1941 , 71  yrs. old GENDER: Female ENDOSCOPIST: Roxy Cedar, MD REFERRED [id number] Program Recall PROCEDURE DATE:  06/27/2013 PROCEDURE:   Colonoscopy, surveillance First Screening Colonoscopy - Avg.  risk and is 50 yrs.  old or older - No.  Prior Negative Screening - Now for repeat screening. N/A  History of Adenoma - Now for follow-up colonoscopy & has been > or = to 3 yrs.  Yes hx of adenoma.  Has been 3 or more years since last colonoscopy.  Polyps Removed Today? No.  Recommend repeat exam, <10 yrs? No. ASA CLASS:   Class II INDICATIONS:Patient's personal history of adenomatous colon polyps. Index 04-2008 (sessile serrated polyp) MEDICATIONS: MAC sedation, administered by CRNA and propofol (Diprivan) 300mg  IV  DESCRIPTION OF PROCEDURE:   After the risks benefits and alternatives of the procedure were thoroughly explained, informed consent was obtained.  A digital rectal exam revealed external hemorrhoids.   The LB [id number]  endoscope was introduced through the anus and advanced to the cecum, which was identified by both the appendix and ileocecal valve. No adverse events experienced.   The quality of the prep was adequate, using MoviPrep The instrument was then slowly withdrawn as the colon was fully examined.      COLON FINDINGS: A normal appearing cecum, ileocecal valve, and appendiceal orifice were identified.  The ascending, hepatic flexure, transverse, splenic flexure, descending, sigmoid colon and rectum appeared unremarkable.  No polyps or cancers were seen. Retroflexed views revealed internal hemorrhoids. The time to cecum=7 minutes 13 seconds.  Withdrawal time=13 minutes 22 seconds. The scope was withdrawn and the procedure completed.  COMPLICATIONS: There were no  complications.  ENDOSCOPIC IMPRESSION: 1. Normal colon  RECOMMENDATIONS: 1. Return to the care of your primary provider.  GI follow up as needed   eSigned:  Roxy Cedar, MD 06/27/2013 8:29 AM   cc: Norberto Sorenson MD and The Patient

## 2013-06-27 NOTE — Progress Notes (Signed)
Procedure ends, to recovery, report given and VSS. 

## 2013-06-27 NOTE — Patient Instructions (Signed)

## 2013-06-27 NOTE — Progress Notes (Signed)
Patient did not experience any of the following events: a burn prior to discharge; a fall within the facility; wrong site/side/patient/procedure/implant event; or a hospital transfer or hospital admission upon discharge from the facility. (G8907) Patient did not have preoperative order for IV antibiotic SSI prophylaxis. (G8918)  

## 2013-06-28 ENCOUNTER — Telehealth: Payer: Self-pay | Admitting: *Deleted

## 2013-06-28 NOTE — Telephone Encounter (Signed)
  Follow up Call-  Call back number 06/27/2013  Post procedure Call Back phone  # [phone]  Permission to leave phone message Yes     Patient questions:  Do you have a fever, pain , or abdominal swelling? no Pain Score  0 *  Have you tolerated food without any problems? yes  Have you been able to return to your normal activities? yes  Do you have any questions about your discharge instructions: Diet   no Medications  no Follow up visit  no  Do you have questions or concerns about your Care? no  Actions: * If pain score is 4 or above: No action needed, pain <4.

## 2013-07-02 ENCOUNTER — Ambulatory Visit (INDEPENDENT_AMBULATORY_CARE_PROVIDER_SITE_OTHER): Payer: PRIVATE HEALTH INSURANCE | Admitting: Emergency Medicine

## 2013-07-02 ENCOUNTER — Ambulatory Visit: Payer: PRIVATE HEALTH INSURANCE

## 2013-07-02 VITALS — BP 130/52 | HR 83 | Temp 98.2°F | Resp 18 | Wt 146.0 lb

## 2013-07-02 DIAGNOSIS — M549 Dorsalgia, unspecified: Secondary | ICD-10-CM

## 2013-07-02 DIAGNOSIS — R319 Hematuria, unspecified: Secondary | ICD-10-CM

## 2013-07-02 LAB — POCT URINALYSIS DIPSTICK
Ketones, UA: NEGATIVE
Protein, UA: NEGATIVE
Spec Grav, UA: <=1.005
Urobilinogen, UA: 0.2
pH, UA: 5.5

## 2013-07-02 LAB — POCT UA - MICROSCOPIC ONLY
Crystals, Ur, HPF, POC: NEGATIVE
Mucus, UA: POSITIVE

## 2013-07-02 IMAGING — CR DG THORACIC SPINE 2V
2 series · 2 of 2 positions shown · non-contrast
Comparison: None.

CLINICAL DATA: Back pain.

EXAM:
THORACIC SPINE - 2 VIEW

[AP]
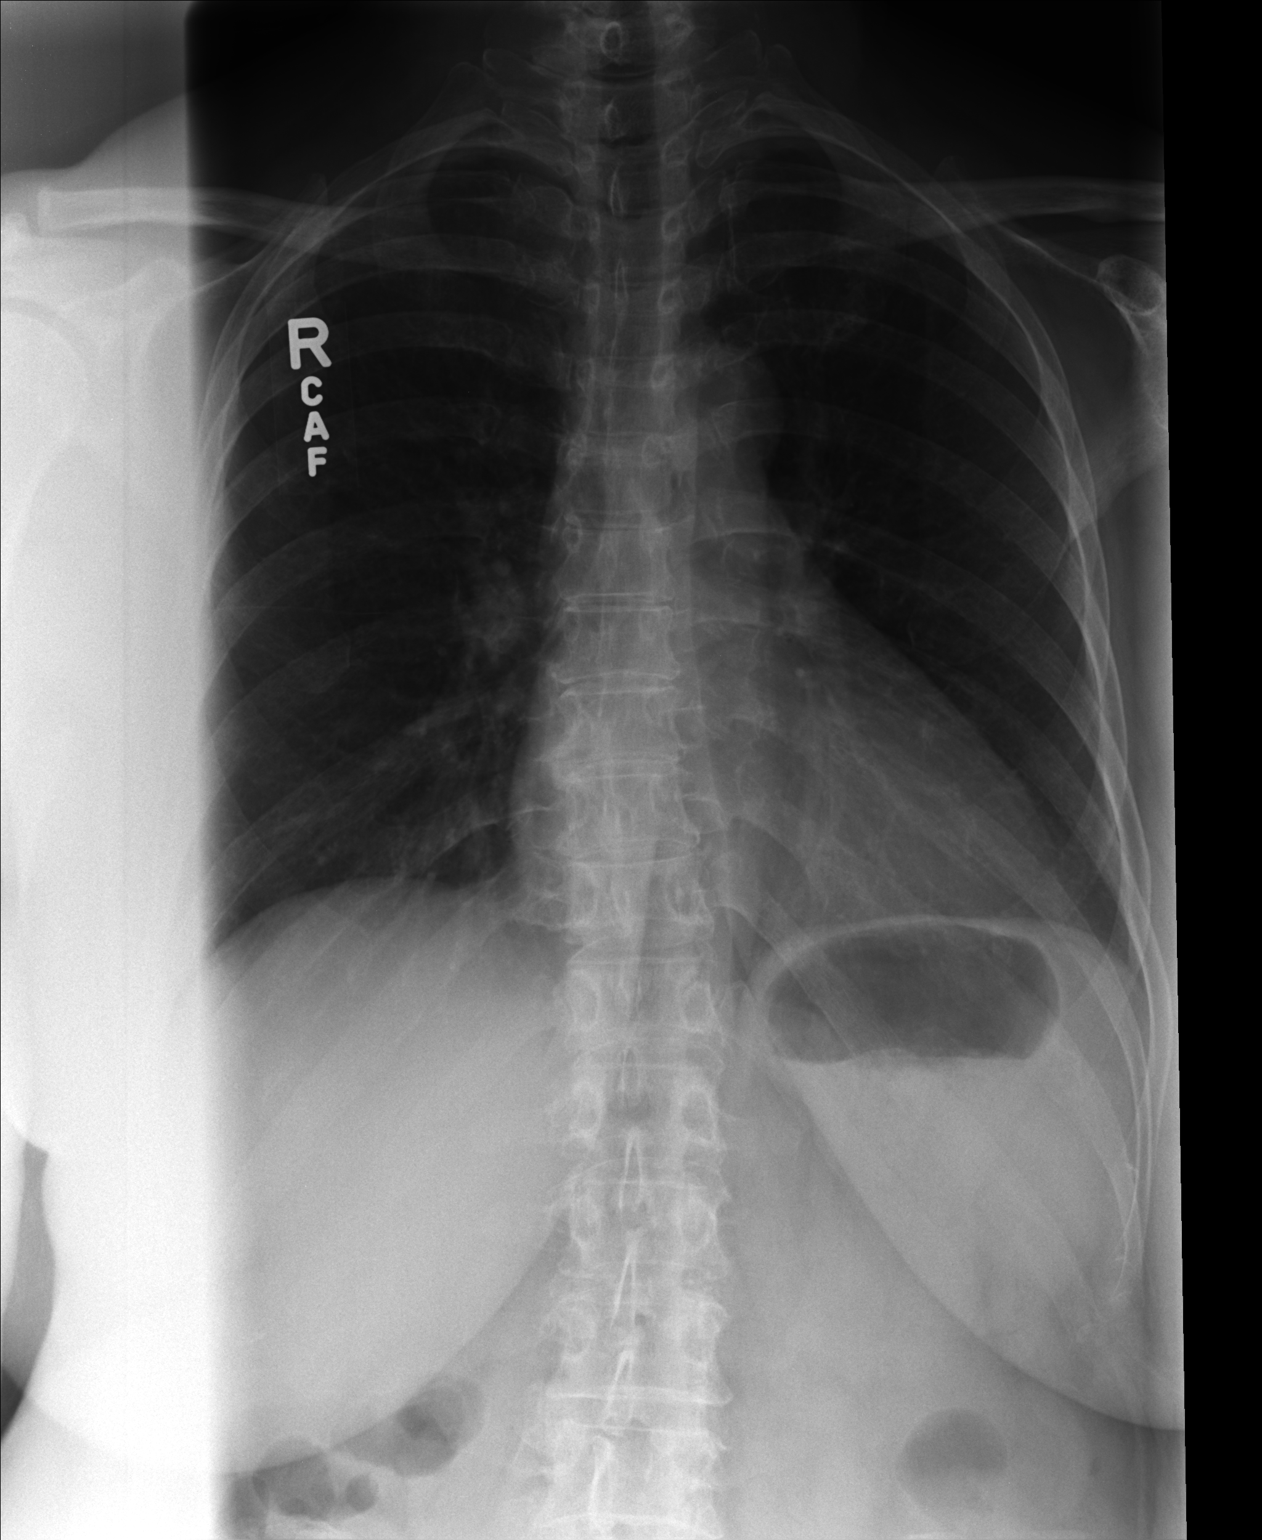

[lateral]
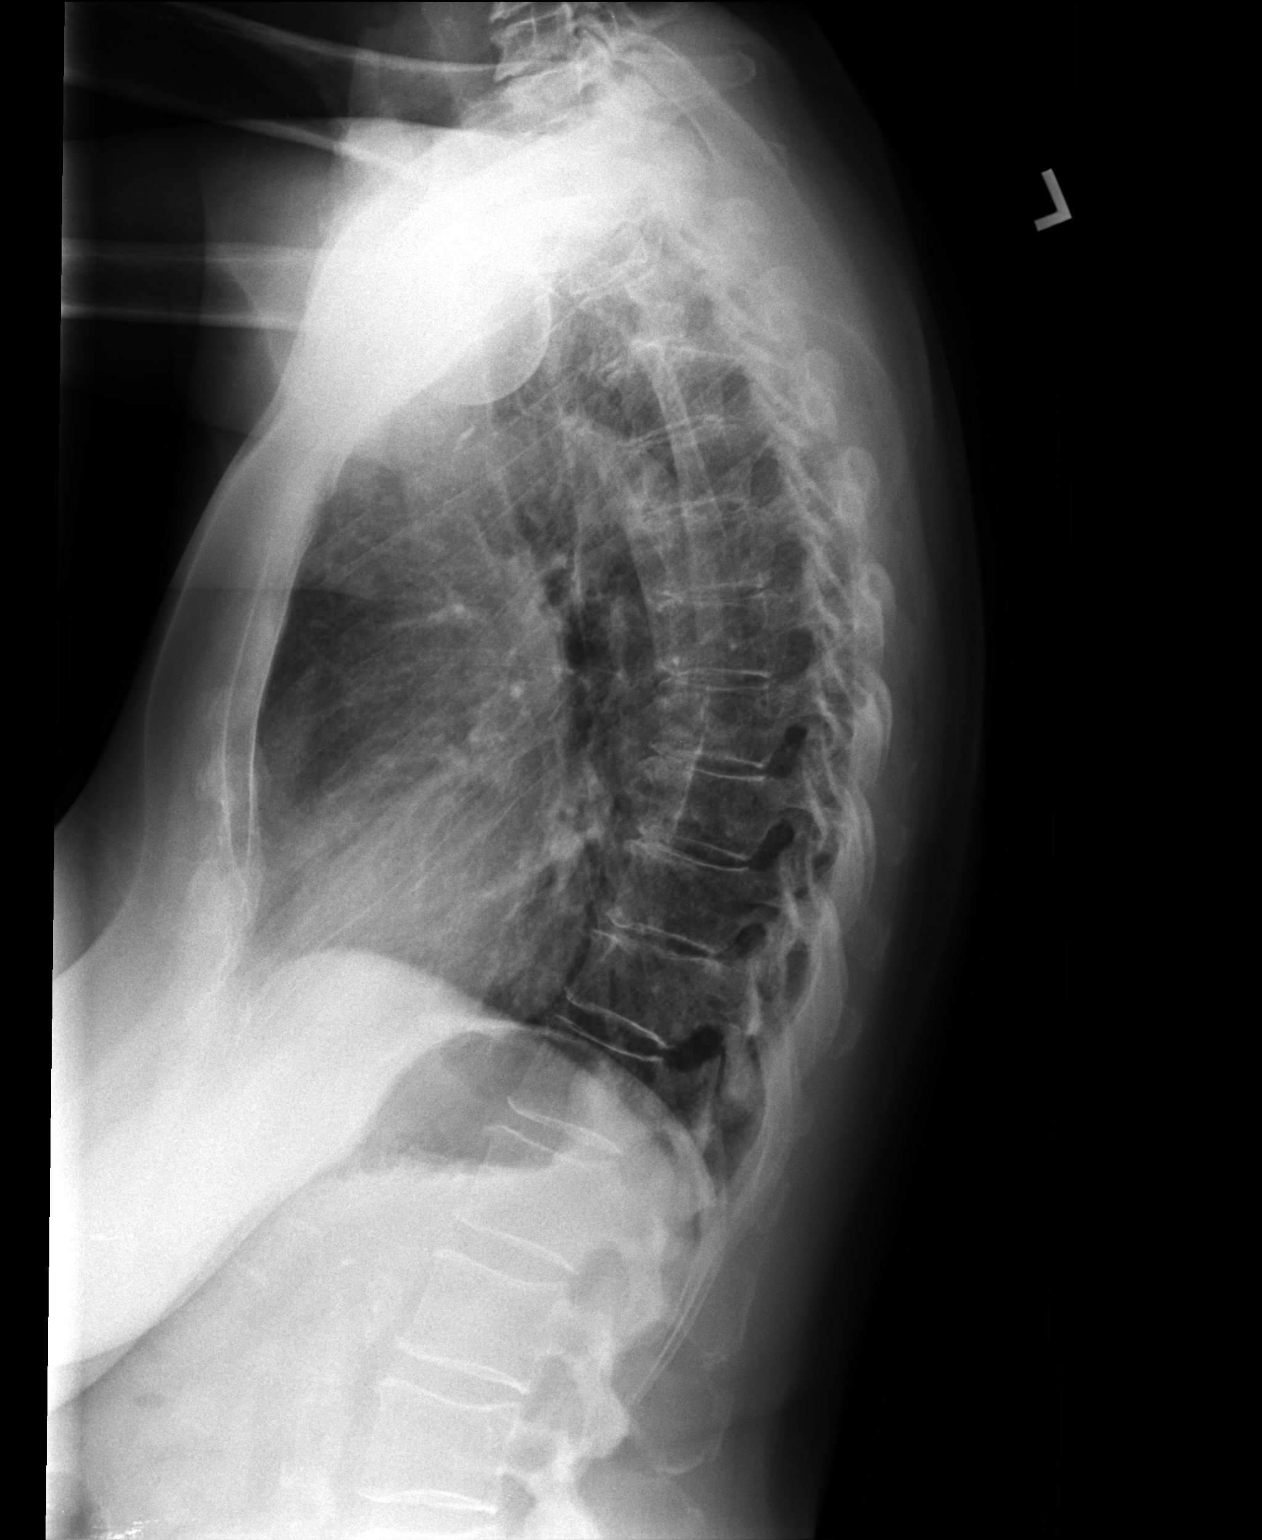

[2 of 2 positions shown; findings below may reference images not displayed]

FINDINGS: Alignment is normal. There are mild thoracic degenerative changes.
There may be mild anterior wedging at T11, age indeterminate. No
suspicious lytic or blastic lesions are identified.
IMPRESSION: 1. Possible wedge compression fracture at T11, age indeterminate.
2. Degenerative changes.

## 2013-07-02 IMAGING — CR DG CHEST 2V
2 series · 2 of 2 positions shown · non-contrast
Comparison: [DATE]

CLINICAL DATA: Back pain.

EXAM:
CHEST  2 VIEW

[PA]
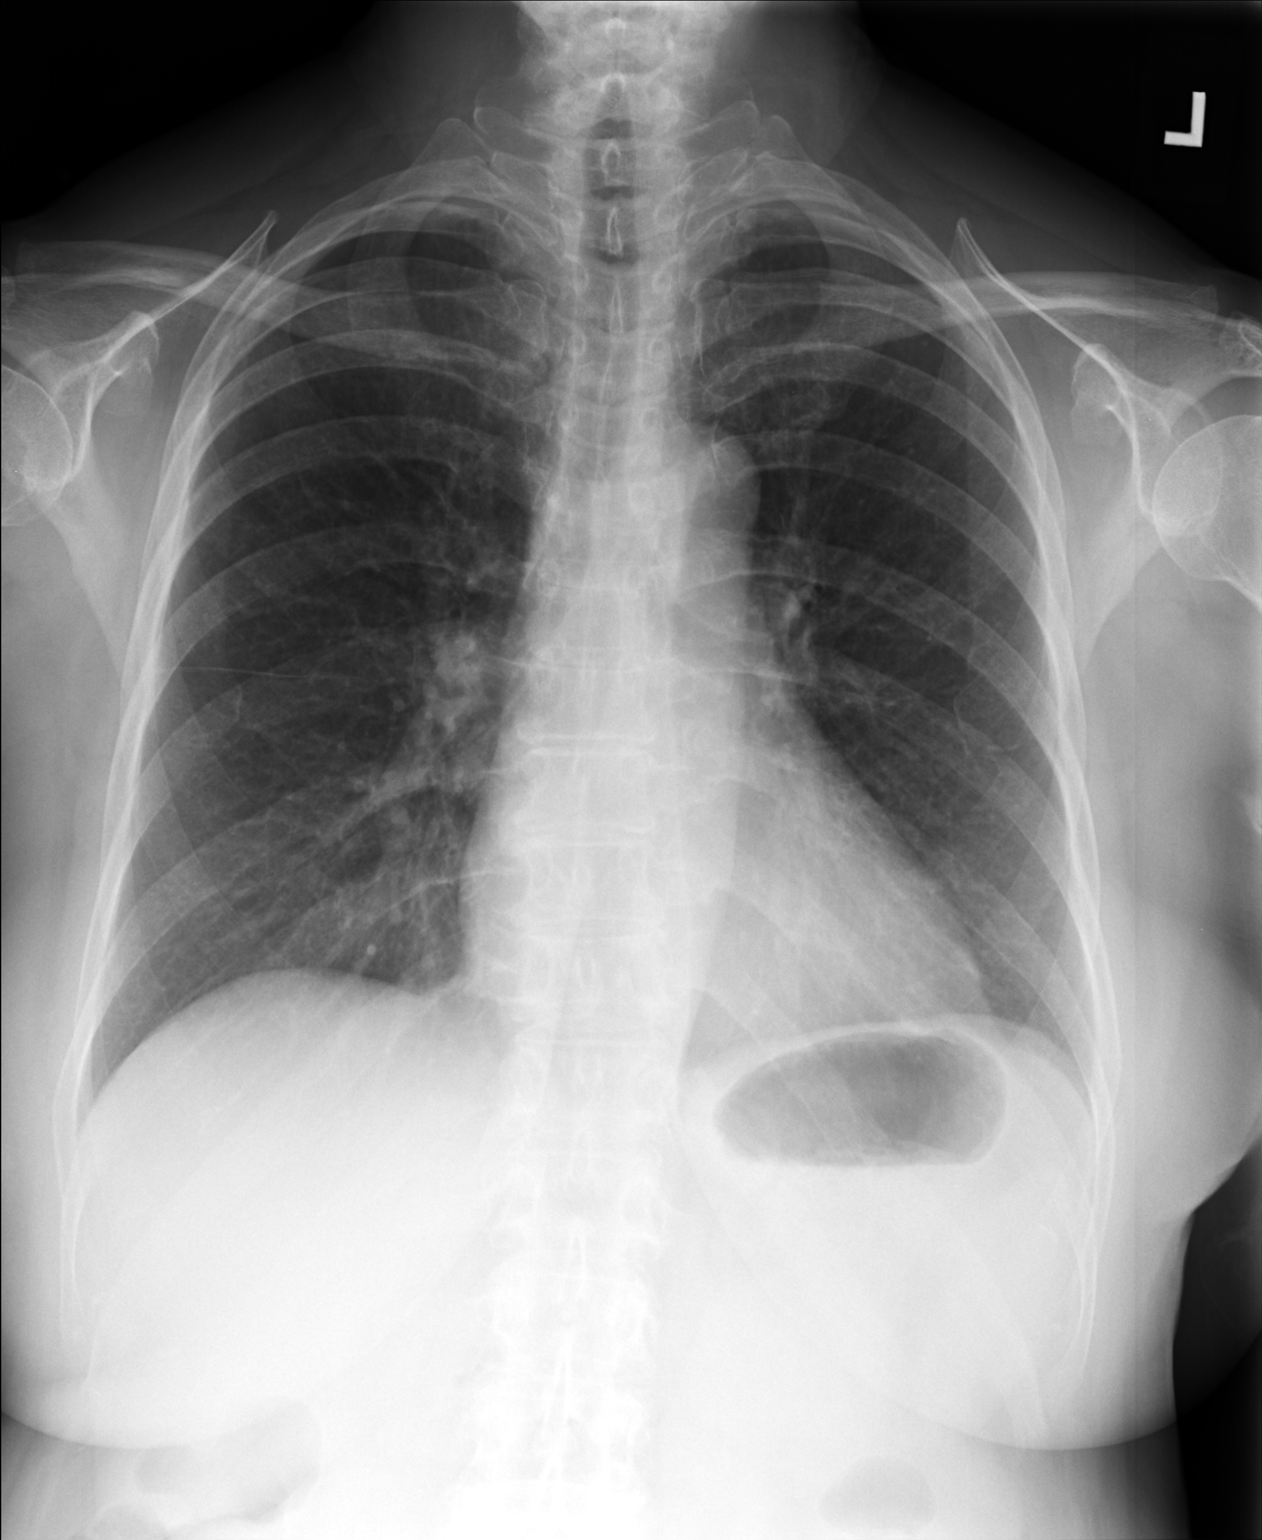

[lateral]
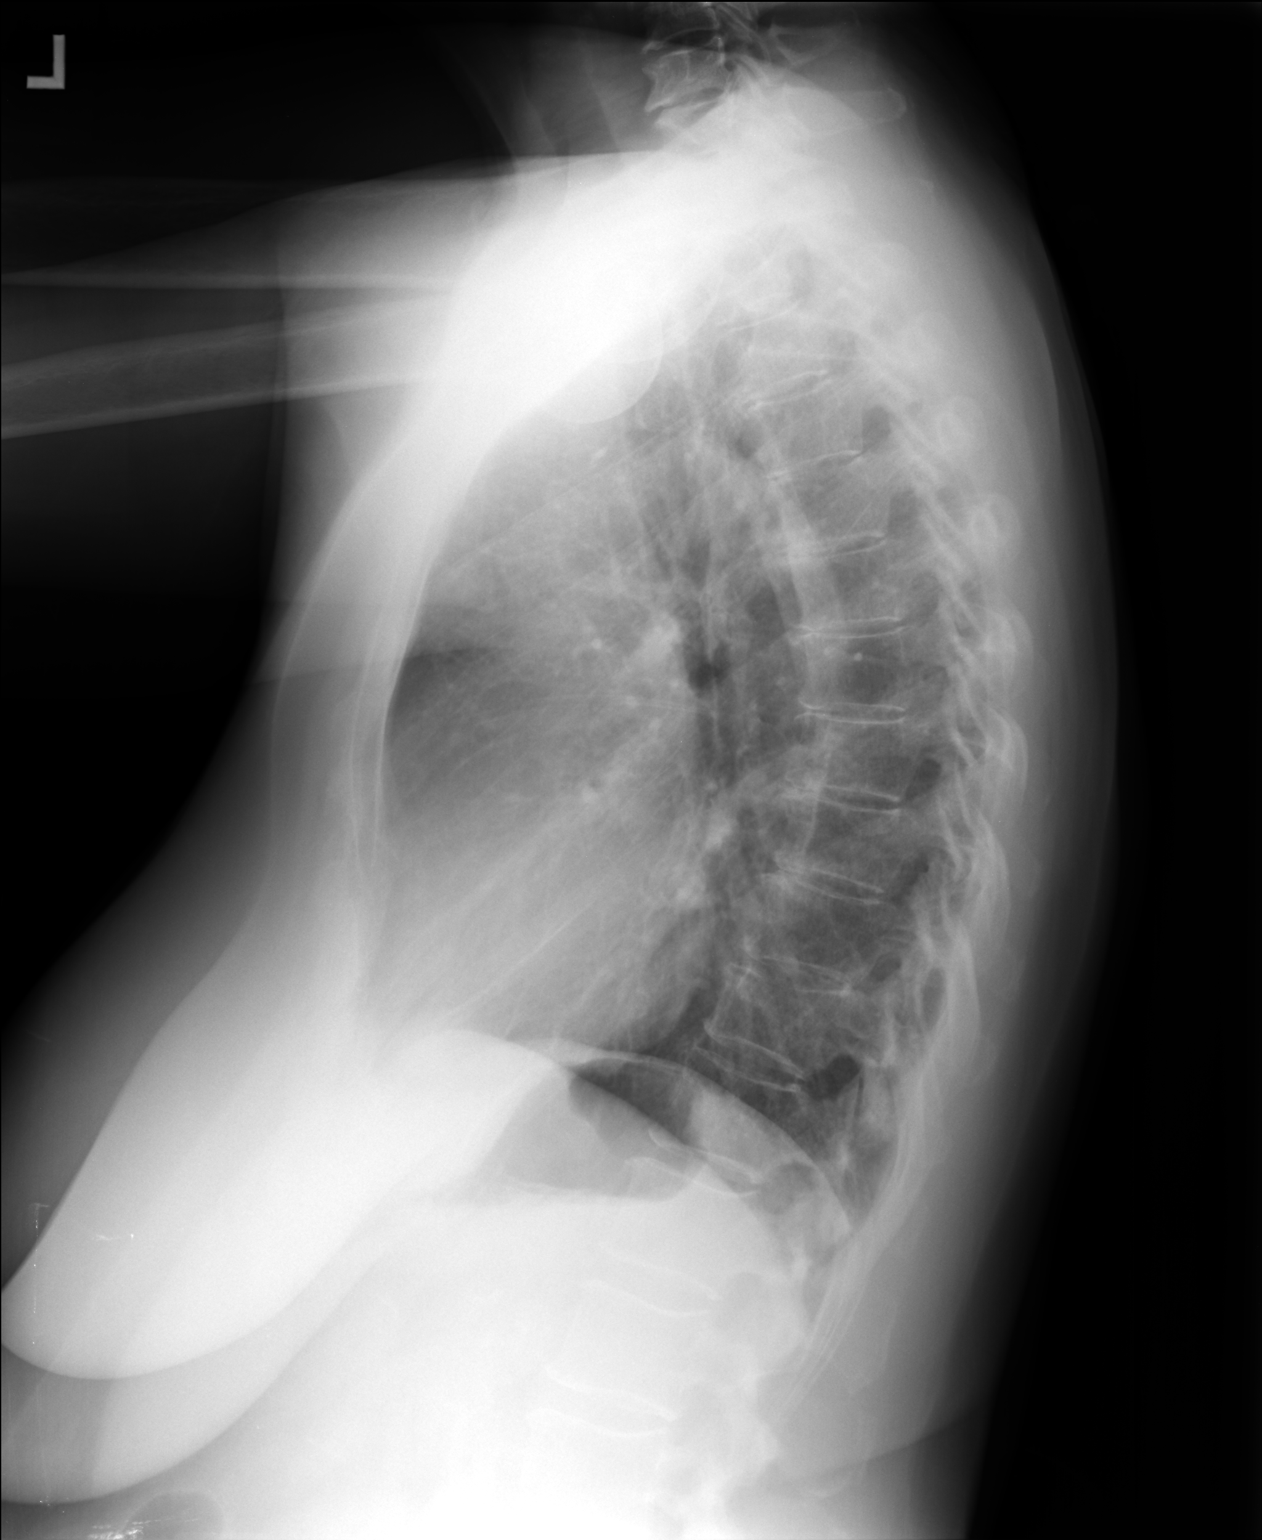

[2 of 2 positions shown; findings below may reference images not displayed]

FINDINGS: Minimal wedging of a lower thoracic vertebral body, approximately
T11. Minimal S-shaped thoracic spine curvature. Midline trachea.
Borderline cardiomegaly. Mediastinal contours otherwise within
normal limits. No pleural effusion or pneumothorax. Clear lungs.
IMPRESSION: Borderline cardiomegaly, without acute disease.

Minimal wedging of a lower thoracic vertebral body, likely T11. This
is of indeterminate acuity.

## 2013-07-02 MED ORDER — HYDROCODONE-ACETAMINOPHEN 5-325 MG PO TABS: 1.0000 | ORAL_TABLET | Freq: Four times a day (QID) | ORAL | Status: DC | PRN

## 2013-07-02 NOTE — Patient Instructions (Signed)
Back Pain, Adult Low back pain is very common. About 1 in 5 people have back pain.The cause of low back pain is rarely dangerous. The pain often gets better over time.About half of people with a sudden onset of back pain feel better in just 2 weeks. About 8 in 10 people feel better by 6 weeks.  CAUSES Some common causes of back pain include:  Strain of the muscles or ligaments supporting the spine.  Wear and tear (degeneration) of the spinal discs.  Arthritis.  Direct injury to the back. DIAGNOSIS Most of the time, the direct cause of low back pain is not known.However, back pain can be treated effectively even when the exact cause of the pain is unknown.Answering your caregiver's questions about your overall health and symptoms is one of the most accurate ways to make sure the cause of your pain is not dangerous. If your caregiver needs more information, he or she may order lab work or imaging tests (X-rays or MRIs).However, even if imaging tests show changes in your back, this usually does not require surgery. HOME CARE INSTRUCTIONS For many people, back pain returns.Since low back pain is rarely dangerous, it is often a condition that people can learn to manageon their own.   Remain active. It is stressful on the back to sit or stand in one place. Do not sit, drive, or stand in one place for more than 30 minutes at a time. Take short walks on level surfaces as soon as pain allows.Try to increase the length of time you walk each day.  Do not stay in bed.Resting more than 1 or 2 days can delay your recovery.  Do not avoid exercise or work.Your body is made to move.It is not dangerous to be active, even though your back may hurt.Your back will likely heal faster if you return to being active before your pain is gone.  Pay attention to your body when you bend and lift. Many people have less discomfortwhen lifting if they bend their knees, keep the load close to their bodies,and  avoid twisting. Often, the most comfortable positions are those that put less stress on your recovering back.  Find a comfortable position to sleep. Use a firm mattress and lie on your side with your knees slightly bent. If you lie on your back, put a pillow under your knees.  Only take over-the-counter or prescription medicines as directed by your caregiver. Over-the-counter medicines to reduce pain and inflammation are often the most helpful.Your caregiver may prescribe muscle relaxant drugs.These medicines help dull your pain so you can more quickly return to your normal activities and healthy exercise.  Put ice on the injured area.  Put ice in a plastic bag.  Place a towel between your skin and the bag.  Leave the ice on for 15-20 minutes, 03-04 times a day for the first 2 to 3 days. After that, ice and heat may be alternated to reduce pain and spasms.  Ask your caregiver about trying back exercises and gentle massage. This may be of some benefit.  Avoid feeling anxious or stressed.Stress increases muscle tension and can worsen back pain.It is important to recognize when you are anxious or stressed and learn ways to manage it.Exercise is a great option. SEEK MEDICAL CARE IF:  You have pain that is not relieved with rest or medicine.  You have pain that does not improve in 1 week.  You have new symptoms.  You are generally not feeling well. SEEK   IMMEDIATE MEDICAL CARE IF:   You have pain that radiates from your back into your legs.  You develop new bowel or bladder control problems.  You have unusual weakness or numbness in your arms or legs.  You develop nausea or vomiting.  You develop abdominal pain.  You feel faint. Document Released: 08/04/2005 Document Revised: 02/03/2012 Document Reviewed: 12/23/2010 [employer] Patient Information 2014 [employer].  

## 2013-07-02 NOTE — Progress Notes (Addendum)
This chart was scribed for Carol Chris, MD by Joaquin Music, ED Scribe. This patient was seen in room Room/bed 14 and the patient's care was started at 4:20 PM. Subjective:    Patient ID: Carol Tucker, female    DOB: 1940/10/25, 72 y.o.   MRN: [id number] Chief Complaint  Patient presents with  . Back Pain    mid back   HPI Carol Tucker is a 71 y.o. female who presents to the [facility] complaining of ongoing worsening lower back pain that is beginning to radiate to the L side. Pt suspects she may have injured herself but is unsure. She states she attended a water aerobics course 2 days ago but states this is not her first course. Pt states certain movements and inhaling can worsen her pain. Pt denies having any back fractures. Pt denies having any new rashes.  Pt denies hx of nephrolysis. Pt is concerned her back pain can be related to cardiac complications. She states her father had cardiac complications.   She states she has had her shingles vaccine.  Review of Systems  Musculoskeletal: Positive for back pain and gait problem.   Objective:   Physical Exam CONSTITUTIONAL: Well developed/well nourished HEAD: Normocephalic/atraumatic EYES: EOMI/PERRL ENMT: Mucous membranes moist NECK: supple no meningeal signs SPINE:entire spine nontender CV: S1/S2 noted, no murmurs/rubs/gallops noted LUNGS: Lungs are clear to auscultation bilaterally, no apparent distress ABDOMEN: soft, nontender, no rebound or guarding GU:no cva tenderness NEURO: Pt is awake/alert, moves all extremitiesx4 EXTREMITIES: pulses normal, full ROM SKIN: warm, color normal PSYCH: no abnormalities of mood noted  [facility] reading (PRIMARY) by  Dr. Cleta Alberts please comment on T12. There may be a tiny bit of wedging. Chest x-ray is normal.   EKG normal sinus rhythm RSR  In V1 Results for orders placed in visit on 07/02/13  POCT URINALYSIS DIPSTICK      Result Value Range   Color, UA yellow     Clarity, UA clear      Glucose, UA neg     Bilirubin, UA neg     Ketones, UA neg     Spec Grav, UA <=1.005     Blood, UA small     pH, UA 5.5     Protein, UA neg     Urobilinogen, UA 0.2     Nitrite, UA neg     Leukocytes, UA Trace     Results for orders placed in visit on 07/02/13  POCT URINALYSIS DIPSTICK      Result Value Range   Color, UA yellow     Clarity, UA clear     Glucose, UA neg     Bilirubin, UA neg     Ketones, UA neg     Spec Grav, UA <=1.005     Blood, UA small     pH, UA 5.5     Protein, UA neg     Urobilinogen, UA 0.2     Nitrite, UA neg     Leukocytes, UA Trace    POCT UA - MICROSCOPIC ONLY      Result Value Range   WBC, Ur, HPF, POC 0-2     RBC, urine, microscopic 1-3     Bacteria, U Microscopic 2+     Mucus, UA pos     Epithelial cells, urine per micros neg     Crystals, Ur, HPF, POC neg     Casts, Ur, LPF, POC neg     Yeast, UA neg  Triage Vitals:BP 130/52  Pulse 83  Temp(Src) 98.2 F (36.8 C) (Oral)  Resp 18  Wt 146 lb (66.225 kg)  SpO2 98% Assessment & Plan:  The radiologist agrees there may be a mild wedge of T11. She will be placed on pain medication. She was advised to avoid forward lifting. She was advised to discuss with Dr. Clelia Croft possibly getting an early bone density study. She is to return to clinic in one week for repeat film. She was given hydrocodone for pain

## 2013-07-10 ENCOUNTER — Ambulatory Visit (INDEPENDENT_AMBULATORY_CARE_PROVIDER_SITE_OTHER): Payer: PRIVATE HEALTH INSURANCE | Admitting: Emergency Medicine

## 2013-07-10 VITALS — BP 124/62 | HR 74 | Temp 98.1°F | Resp 18 | Ht 63.0 in | Wt 146.4 lb

## 2013-07-10 DIAGNOSIS — S239XXA Sprain of unspecified parts of thorax, initial encounter: Secondary | ICD-10-CM

## 2013-07-10 NOTE — Progress Notes (Signed)
[facility] [address], [place] [address] [phone]  Date:  07/10/2013   Name:  Carol Tucker   DOB:  August 15, 1941   MRN:  [id number]  PCP:  Norberto Sorenson, MD    Chief Complaint: Follow-up   History of Present Illness:  Carol Tucker is a 72 y.o. very pleasant female patient who presents with the following:  Seen a week ago for back pain and treated with vicodin.  Had mild anterior T11 wedging by radiologist.  Since has stopped vicodin and resumed normal activities and has NO continued pain.  Not interested in repeat films unless it will change what is done for.  Denies other complaint or health concern today.   Patient Active Problem List   Diagnosis Date Noted  . Migraine 04/05/2012  . Hyperlipidemia   . GERD (gastroesophageal reflux disease)   . Osteopenia   . HTN (hypertension) 11/04/2011    Past Medical History  Diagnosis Date  . Hypertension   . Hyperlipidemia   . GERD (gastroesophageal reflux disease)   . Osteopenia 8-04  . Allergy   . Cancer   . Depression   . Anxiety   . Prediabetes     Past Surgical History  Procedure Laterality Date  . Appendectomy    . Tubal ligation  1980  . Knee arthroscopy  2007  . 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 Dilation and curettage of uterus  1969    MISSED AB  . Tumor removal salivary gland      History  Substance Use Topics  . Smoking status: Former Games developer  . Smokeless tobacco: Never Used  . Alcohol Use: Yes     Comment: Rare    Family History  Problem Relation Age of Onset  . Hypertension Father   . Heart disease Father   . Cancer Father     BLADDER & LUNG  . Heart disease Paternal Grandmother   . Alzheimer's disease Mother   . Heart disease Brother   . Heart disease Paternal Grandfather   . Diabetes Paternal Grandfather   . Colon cancer Neg Hx   . Pancreatic cancer Neg Hx   . Stomach cancer Neg Hx     Allergies  Allergen Reactions  . Metoclopramide Hcl    REACTION: tremors, clinched jaws     REGLAN    Medication list has been reviewed and updated.  Current Outpatient Prescriptions on File Prior to Visit  Medication Sig Dispense Refill  . aspirin EC 81 MG tablet Take 81 mg by mouth daily.      . calcium carbonate (OS-CAL) 600 MG TABS tablet Take 600 mg by mouth 2 (two) times daily with a meal.      . cholecalciferol (VITAMIN D) 1000 UNITS tablet Take 1,000 Units by mouth 2 (two) times daily.      . Coenzyme Q10 (CO Q 10 PO) Take 300 mg by mouth.      . famotidine (PEPCID) 20 MG tablet Take 1 tablet (20 mg total) by mouth 2 (two) times daily.  60 tablet  11  . fish oil-omega-3 fatty acids 1000 MG capsule Take 1 g by mouth daily.      Marland Kitchen HYDROcodone-acetaminophen (NORCO) 5-325 MG per tablet Take 1 tablet by mouth every 6 (six) hours as needed.  20 tablet  0  . lisinopril-hydrochlorothiazide (PRINZIDE,ZESTORETIC) 10-12.5 MG per tablet Take 1 tablet by mouth daily.  90 tablet  1  .  naphazoline (CLEAR EYES) 0.012 % ophthalmic solution Place 1 drop into both eyes 4 (four) times daily as needed. For dry eyes       No current facility-administered medications on file prior to visit.    Review of Systems:  As per HPI, otherwise negative.    Physical Examination: Filed Vitals:   07/10/13 1403  BP: 124/62  Pulse: 74  Temp: 98.1 F (36.7 C)  Resp: 18   Filed Vitals:   07/10/13 1403  Height: 5\' 3"  (1.6 m)  Weight: 146 lb 6.4 oz (66.407 kg)   Body mass index is 25.94 kg/(m^2). Ideal Body Weight: Weight in (lb) to have BMI = 25: 140.8   GEN: WDWN, NAD, Non-toxic, Alert & Oriented x 3 HEENT: Atraumatic, Normocephalic.  Ears and Nose: No external deformity. EXTR: No clubbing/cyanosis/edema NEURO: Normal gait.  PSYCH: Normally interactive. Conversant. Not depressed or anxious appearing.  Calm demeanor.  BACK:  Not tender, no spasm  Assessment and Plan: Back pain, resolved followup prn   Signed,  Phillips Odor, MD

## 2013-07-10 NOTE — Patient Instructions (Signed)
Back Pain, Adult Low back pain is very common. About 1 in 5 people have back pain.The cause of low back pain is rarely dangerous. The pain often gets better over time.About half of people with a sudden onset of back pain feel better in just 2 weeks. About 8 in 10 people feel better by 6 weeks.  CAUSES Some common causes of back pain include:  Strain of the muscles or ligaments supporting the spine.  Wear and tear (degeneration) of the spinal discs.  Arthritis.  Direct injury to the back. DIAGNOSIS Most of the time, the direct cause of low back pain is not known.However, back pain can be treated effectively even when the exact cause of the pain is unknown.Answering your caregiver's questions about your overall health and symptoms is one of the most accurate ways to make sure the cause of your pain is not dangerous. If your caregiver needs more information, he or she may order lab work or imaging tests (X-rays or MRIs).However, even if imaging tests show changes in your back, this usually does not require surgery. HOME CARE INSTRUCTIONS For many people, back pain returns.Since low back pain is rarely dangerous, it is often a condition that people can learn to manageon their own.   Remain active. It is stressful on the back to sit or stand in one place. Do not sit, drive, or stand in one place for more than 30 minutes at a time. Take short walks on level surfaces as soon as pain allows.Try to increase the length of time you walk each day.  Do not stay in bed.Resting more than 1 or 2 days can delay your recovery.  Do not avoid exercise or work.Your body is made to move.It is not dangerous to be active, even though your back may hurt.Your back will likely heal faster if you return to being active before your pain is gone.  Pay attention to your body when you bend and lift. Many people have less discomfortwhen lifting if they bend their knees, keep the load close to their bodies,and  avoid twisting. Often, the most comfortable positions are those that put less stress on your recovering back.  Find a comfortable position to sleep. Use a firm mattress and lie on your side with your knees slightly bent. If you lie on your back, put a pillow under your knees.  Only take over-the-counter or prescription medicines as directed by your caregiver. Over-the-counter medicines to reduce pain and inflammation are often the most helpful.Your caregiver may prescribe muscle relaxant drugs.These medicines help dull your pain so you can more quickly return to your normal activities and healthy exercise.  Put ice on the injured area.  Put ice in a plastic bag.  Place a towel between your skin and the bag.  Leave the ice on for 15-20 minutes, 03-04 times a day for the first 2 to 3 days. After that, ice and heat may be alternated to reduce pain and spasms.  Ask your caregiver about trying back exercises and gentle massage. This may be of some benefit.  Avoid feeling anxious or stressed.Stress increases muscle tension and can worsen back pain.It is important to recognize when you are anxious or stressed and learn ways to manage it.Exercise is a great option. SEEK MEDICAL CARE IF:  You have pain that is not relieved with rest or medicine.  You have pain that does not improve in 1 week.  You have new symptoms.  You are generally not feeling well. SEEK   IMMEDIATE MEDICAL CARE IF:   You have pain that radiates from your back into your legs.  You develop new bowel or bladder control problems.  You have unusual weakness or numbness in your arms or legs.  You develop nausea or vomiting.  You develop abdominal pain.  You feel faint. Document Released: 08/04/2005 Document Revised: 02/03/2012 Document Reviewed: 12/23/2010 [employer] Patient Information 2014 [employer].  

## 2013-07-15 ENCOUNTER — Encounter: Payer: Self-pay | Admitting: Family Medicine

## 2013-07-15 NOTE — Telephone Encounter (Signed)
Dr Elease Hashimoto is the cardiologist.

## 2013-07-25 ENCOUNTER — Encounter: Payer: Self-pay | Admitting: Family Medicine

## 2013-11-18 ENCOUNTER — Other Ambulatory Visit (INDEPENDENT_AMBULATORY_CARE_PROVIDER_SITE_OTHER): Payer: PRIVATE HEALTH INSURANCE | Admitting: *Deleted

## 2013-11-18 DIAGNOSIS — Z79899 Other long term (current) drug therapy: Secondary | ICD-10-CM

## 2013-11-18 DIAGNOSIS — E785 Hyperlipidemia, unspecified: Secondary | ICD-10-CM

## 2013-11-18 LAB — LIPID PANEL
Cholesterol: 238 mg/dL — ABNORMAL HIGH (ref 0–200)
HDL: 43 mg/dL (ref >39)
LDL CALC: 165 mg/dL — AB (ref 0–99)
Total CHOL/HDL Ratio: 5.5 Ratio
Triglycerides: 150 mg/dL — ABNORMAL HIGH (ref <150)
VLDL: 30 mg/dL (ref 0–40)

## 2013-11-18 LAB — BASIC METABOLIC PANEL
BUN: 15 mg/dL (ref 6–23)
CALCIUM: 9.5 mg/dL (ref 8.4–10.5)
CO2: 25 meq/L (ref 19–32)
Chloride: 103 mEq/L (ref 96–112)
Creat: 0.94 mg/dL (ref 0.50–1.10)
Glucose, Bld: 96 mg/dL (ref 70–99)
POTASSIUM: 4.3 meq/L (ref 3.5–5.3)
Sodium: 137 mEq/L (ref 135–145)

## 2013-11-18 NOTE — Progress Notes (Signed)
Pt here for lab only visit 

## 2013-11-28 ENCOUNTER — Other Ambulatory Visit: Payer: Self-pay | Admitting: Family Medicine

## 2013-12-26 ENCOUNTER — Other Ambulatory Visit: Payer: Self-pay | Admitting: Family Medicine

## 2014-01-20 ENCOUNTER — Ambulatory Visit (INDEPENDENT_AMBULATORY_CARE_PROVIDER_SITE_OTHER): Payer: PRIVATE HEALTH INSURANCE | Admitting: Family Medicine

## 2014-01-20 ENCOUNTER — Encounter: Payer: Self-pay | Admitting: Family Medicine

## 2014-01-20 VITALS — BP 122/60 | HR 64 | Temp 97.9°F | Resp 16 | Ht 63.0 in | Wt 143.6 lb

## 2014-01-20 DIAGNOSIS — E785 Hyperlipidemia, unspecified: Secondary | ICD-10-CM

## 2014-01-20 DIAGNOSIS — I10 Essential (primary) hypertension: Secondary | ICD-10-CM

## 2014-01-20 DIAGNOSIS — M8580 Other specified disorders of bone density and structure, unspecified site: Secondary | ICD-10-CM

## 2014-01-20 DIAGNOSIS — M899 Disorder of bone, unspecified: Secondary | ICD-10-CM

## 2014-01-20 DIAGNOSIS — Z23 Encounter for immunization: Secondary | ICD-10-CM

## 2014-01-20 DIAGNOSIS — M949 Disorder of cartilage, unspecified: Secondary | ICD-10-CM

## 2014-01-20 DIAGNOSIS — Z79899 Other long term (current) drug therapy: Secondary | ICD-10-CM

## 2014-01-20 DIAGNOSIS — E559 Vitamin D deficiency, unspecified: Secondary | ICD-10-CM

## 2014-01-20 LAB — LIPID PANEL
CHOLESTEROL: 235 mg/dL — AB (ref 0–200)
HDL: 36 mg/dL — ABNORMAL LOW (ref >39)
LDL Cholesterol: 174 mg/dL — ABNORMAL HIGH (ref 0–99)
Total CHOL/HDL Ratio: 6.5 Ratio
Triglycerides: 127 mg/dL (ref <150)
VLDL: 25 mg/dL (ref 0–40)

## 2014-01-20 LAB — COMPREHENSIVE METABOLIC PANEL
ALT: 11 U/L (ref 0–35)
AST: 14 U/L (ref 0–37)
Albumin: 4.2 g/dL (ref 3.5–5.2)
Alkaline Phosphatase: 44 U/L (ref 39–117)
BILIRUBIN TOTAL: 0.4 mg/dL (ref 0.2–1.2)
BUN: 20 mg/dL (ref 6–23)
CALCIUM: 9.5 mg/dL (ref 8.4–10.5)
CHLORIDE: 102 meq/L (ref 96–112)
CO2: 24 meq/L (ref 19–32)
Creat: 0.91 mg/dL (ref 0.50–1.10)
GLUCOSE: 108 mg/dL — AB (ref 70–99)
Potassium: 4.4 mEq/L (ref 3.5–5.3)
Sodium: 137 mEq/L (ref 135–145)
Total Protein: 7.1 g/dL (ref 6.0–8.3)

## 2014-01-20 MED ORDER — LISINOPRIL-HYDROCHLOROTHIAZIDE 10-12.5 MG PO TABS: ORAL_TABLET | ORAL | Status: DC

## 2014-01-20 MED ORDER — FAMOTIDINE 20 MG PO TABS: 20.0000 mg | ORAL_TABLET | Freq: Two times a day (BID) | ORAL | Status: DC

## 2014-01-20 NOTE — Patient Instructions (Signed)
Double your fish oil and your calcium supplement to twice a day. You may even want to try 2 tabs twice a day on your fish oil. Good job starting the [employer]. Lets recheck your cholesterol at your next office visit in 6 months. I will be looking for your bone scan (dexa) results in late August/September.  Fat and Cholesterol Control Diet Fat and cholesterol levels in your blood and organs are influenced by your diet. High levels of fat and cholesterol may lead to diseases of the heart, small and large blood vessels, gallbladder, liver, and pancreas. CONTROLLING FAT AND CHOLESTEROL WITH DIET Although exercise and lifestyle factors are important, your diet is key. That is because certain foods are known to raise cholesterol and others to lower it. The goal is to balance foods for their effect on cholesterol and more importantly, to replace saturated and trans fat with other types of fat, such as monounsaturated fat, polyunsaturated fat, and omega-3 fatty acids. On average, a person should consume no more than 15 to 17 g of saturated fat daily. Saturated and trans fats are considered "bad" fats, and they will raise LDL cholesterol. Saturated fats are primarily found in animal products such as meats, butter, and cream. However, that does not mean you need to give up all your favorite foods. Today, there are good tasting, low-fat, low-cholesterol substitutes for most of the things you like to eat. Choose low-fat or nonfat alternatives. Choose round or loin cuts of red meat. These types of cuts are lowest in fat and cholesterol. Chicken (without the skin), fish, veal, and ground [place] breast are great choices. Eliminate fatty meats, such as hot dogs and salami. Even shellfish have little or no saturated fat. Have a 3 oz (85 g) portion when you eat lean meat, poultry, or fish. Trans fats are also called "partially hydrogenated oils." They are oils that have been scientifically manipulated so that they are  solid at room temperature resulting in a longer shelf life and improved taste and texture of foods in which they are added. Trans fats are found in stick margarine, some tub margarines, cookies, crackers, and baked goods.  When baking and cooking, oils are a great substitute for butter. The monounsaturated oils are especially beneficial since it is believed they lower LDL and raise HDL. The oils you should avoid entirely are saturated tropical oils, such as coconut and palm.  Remember to eat a lot from food groups that are naturally free of saturated and trans fat, including fish, fruit, vegetables, beans, grains (barley, rice, couscous, bulgur wheat), and pasta (without cream sauces).  IDENTIFYING FOODS THAT LOWER FAT AND CHOLESTEROL  Soluble fiber may lower your cholesterol. This type of fiber is found in fruits such as apples, vegetables such as broccoli, potatoes, and carrots, legumes such as beans, peas, and lentils, and grains such as barley. Foods fortified with plant sterols (phytosterol) may also lower cholesterol. You should eat at least 2 g per day of these foods for a cholesterol lowering effect.  Read package labels to identify low-saturated fats, trans fat free, and low-fat foods at the supermarket. Select cheeses that have only 2 to 3 g saturated fat per ounce. Use a heart-healthy tub margarine that is free of trans fats or partially hydrogenated oil. When buying baked goods (cookies, crackers), avoid partially hydrogenated oils. Breads and muffins should be made from whole grains (whole-wheat or whole oat flour, instead of "flour" or "enriched flour"). Buy non-creamy canned soups with reduced salt  and no added fats.  FOOD PREPARATION TECHNIQUES  Never deep-fry. If you must fry, either stir-fry, which uses very little fat, or use non-stick cooking sprays. When possible, broil, bake, or roast meats, and steam vegetables. Instead of putting butter or margarine on vegetables, use lemon and herbs,  applesauce, and cinnamon (for squash and sweet potatoes). Use nonfat yogurt, salsa, and low-fat dressings for salads.  LOW-SATURATED FAT / LOW-FAT FOOD SUBSTITUTES Meats / Saturated Fat (g)  Avoid: Steak, marbled (3 oz/85 g) / 11 g  Choose: Steak, lean (3 oz/85 g) / 4 g  Avoid: Hamburger (3 oz/85 g) / 7 g  Choose: Hamburger, lean (3 oz/85 g) / 5 g  Avoid: Ham (3 oz/85 g) / 6 g  Choose: Ham, lean cut (3 oz/85 g) / 2.4 g  Avoid: Chicken, with skin, dark meat (3 oz/85 g) / 4 g  Choose: Chicken, skin removed, dark meat (3 oz/85 g) / 2 g  Avoid: Chicken, with skin, light meat (3 oz/85 g) / 2.5 g  Choose: Chicken, skin removed, light meat (3 oz/85 g) / 1 g Dairy / Saturated Fat (g)  Avoid: Whole milk (1 cup) / 5 g  Choose: Low-fat milk, 2% (1 cup) / 3 g  Choose: Low-fat milk, 1% (1 cup) / 1.5 g  Choose: Skim milk (1 cup) / 0.3 g  Avoid: Hard cheese (1 oz/28 g) / 6 g  Choose: Skim milk cheese (1 oz/28 g) / 2 to 3 g  Avoid: Cottage cheese, 4% fat (1 cup) / 6.5 g  Choose: Low-fat cottage cheese, 1% fat (1 cup) / 1.5 g  Avoid: Ice cream (1 cup) / 9 g  Choose: Sherbet (1 cup) / 2.5 g  Choose: Nonfat frozen yogurt (1 cup) / 0.3 g  Choose: Frozen fruit bar / trace  Avoid: Whipped cream (1 tbs) / 3.5 g  Choose: Nondairy whipped topping (1 tbs) / 1 g Condiments / Saturated Fat (g)  Avoid: Mayonnaise (1 tbs) / 2 g  Choose: Low-fat mayonnaise (1 tbs) / 1 g  Avoid: Butter (1 tbs) / 7 g  Choose: Extra light margarine (1 tbs) / 1 g  Avoid: Coconut oil (1 tbs) / 11.8 g  Choose: Olive oil (1 tbs) / 1.8 g  Choose: Corn oil (1 tbs) / 1.7 g  Choose: Safflower oil (1 tbs) / 1.2 g  Choose: Sunflower oil (1 tbs) / 1.4 g  Choose: Soybean oil (1 tbs) / 2.4 g  Choose: Canola oil (1 tbs) / 1 g Document Released: 08/04/2005 Document Revised: 11/29/2012 Document Reviewed: 01/23/2011 [employer] Patient Information 2014 [place], [place].   Hypertriglyceridemia  Diet for High  blood levels of Triglycerides Most fats in food are triglycerides. Triglycerides in your blood are stored as fat in your body. High levels of triglycerides in your blood may put you at a greater risk for heart disease and stroke.  Normal triglyceride levels are less than 150 mg/dL. Borderline high levels are 150-199 mg/dl. High levels are 200 - 499 mg/dL, and very high triglyceride levels are greater than 500 mg/dL. The decision to treat high triglycerides is generally based on the level. For people with borderline or high triglyceride levels, treatment includes weight loss and exercise. Drugs are recommended for people with very high triglyceride levels. Many people who need treatment for high triglyceride levels have metabolic syndrome. This syndrome is a collection of disorders that often include: insulin resistance, high blood pressure, blood clotting problems, high cholesterol and triglycerides.  TESTING PROCEDURE FOR TRIGLYCERIDES  You should not eat 4 hours before getting your triglycerides measured. The normal range of triglycerides is between 10 and 250 milligrams per deciliter (mg/dl). Some people may have extreme levels (1000 or above), but your triglyceride level may be too high if it is above 150 mg/dl, depending on what other risk factors you have for heart disease.  People with high blood triglycerides may also have high blood cholesterol levels. If you have high blood cholesterol as well as high blood triglycerides, your risk for heart disease is probably greater than if you only had high triglycerides. High blood cholesterol is one of the main risk factors for heart disease. CHANGING YOUR DIET  Your weight can affect your blood triglyceride level. If you are more than 20% above your ideal body weight, you may be able to lower your blood triglycerides by losing weight. Eating less and exercising regularly is the best way to combat this. Fat provides more calories than any other food. The  best way to lose weight is to eat less fat. Only 30% of your total calories should come from fat. Less than 7% of your diet should come from saturated fat. A diet low in fat and saturated fat is the same as a diet to decrease blood cholesterol. By eating a diet lower in fat, you may lose weight, lower your blood cholesterol, and lower your blood triglyceride level.  Eating a diet low in fat, especially saturated fat, may also help you lower your blood triglyceride level. Ask your dietitian to help you figure how much fat you can eat based on the number of calories your caregiver has prescribed for you.  Exercise, in addition to helping with weight loss may also help lower triglyceride levels.   Alcohol can increase blood triglycerides. You may need to stop drinking alcoholic beverages.  Too much carbohydrate in your diet may also increase your blood triglycerides. Some complex carbohydrates are necessary in your diet. These may include bread, rice, potatoes, other starchy vegetables and cereals.  Reduce "simple" carbohydrates. These may include pure sugars, candy, honey, and jelly without losing other nutrients. If you have the kind of high blood triglycerides that is affected by the amount of carbohydrates in your diet, you will need to eat less sugar and less high-sugar foods. Your caregiver can help you with this.  Adding 2-4 grams of fish oil (EPA+ DHA) may also help lower triglycerides. Speak with your caregiver before adding any supplements to your regimen. Following the Diet  Maintain your ideal weight. Your caregivers can help you with a diet. Generally, eating less food and getting more exercise will help you lose weight. Joining a weight control group may also help. Ask your caregivers for a good weight control group in your area.  Eat low-fat foods instead of high-fat foods. This can help you lose weight too.  These foods are lower in fat. Eat MORE of these:   Dried beans, peas, and  lentils.  Egg whites.  Low-fat cottage cheese.  Fish.  Lean cuts of meat, such as round, sirloin, rump, and flank (cut extra fat off meat you fix).  Whole grain breads, cereals and pasta.  Skim and nonfat dry milk.  Low-fat yogurt.  Poultry without the skin.  Cheese made with skim or part-skim milk, such as mozzarella, parmesan, farmers', ricotta, or pot cheese. These are higher fat foods. Eat LESS of these:   Whole milk and foods made from whole milk, such as  American, blue, cheddar, monterey jack, and swiss cheese  High-fat meats, such as luncheon meats, sausages, knockwurst, bratwurst, hot dogs, ribs, corned beef, ground pork, and regular ground beef.  Fried foods. Limit saturated fats in your diet. Substituting unsaturated fat for saturated fat may decrease your blood triglyceride level. You will need to read package labels to know which products contain saturated fats.  These foods are high in saturated fat. Eat LESS of these:   Fried pork skins.  Whole milk.  Skin and fat from poultry.  Palm oil.  Butter.  Shortening.  Cream cheese.  Berniece Salines.  Margarines and baked goods made from listed oils.  Vegetable shortenings.  Chitterlings.  Fat from meats.  Coconut oil.  Palm kernel oil.  Lard.  Cream.  Sour cream.  Fatback.  Coffee whiteners and non-dairy creamers made with these oils.  Cheese made from whole milk. Use unsaturated fats (both polyunsaturated and monounsaturated) moderately. Remember, even though unsaturated fats are better than saturated fats; you still want a diet low in total fat.  These foods are high in unsaturated fat:   Canola oil.  Sunflower oil.  Mayonnaise.  Almonds.  Peanuts.  Pine nuts.  Margarines made with these oils.  Safflower oil.  Olive oil.  Avocados.  Cashews.  Peanut butter.  Sunflower seeds.  Soybean oil.  Peanut oil.  Olives.  Pecans.  Walnuts.  Pumpkin seeds. Avoid sugar and  other high-sugar foods. This will decrease carbohydrates without decreasing other nutrients. Sugar in your food goes rapidly to your blood. When there is excess sugar in your blood, your liver may use it to make more triglycerides. Sugar also contains calories without other important nutrients.  Eat LESS of these:   Sugar, brown sugar, powdered sugar, jam, jelly, preserves, honey, syrup, molasses, pies, candy, cakes, cookies, frosting, pastries, colas, soft drinks, punches, fruit drinks, and regular gelatin.  Avoid alcohol. Alcohol, even more than sugar, may increase blood triglycerides. In addition, alcohol is high in calories and low in nutrients. Ask for sparkling water, or a diet soft drink instead of an alcoholic beverage. Suggestions for planning and preparing meals   Bake, broil, grill or roast meats instead of frying.  Remove fat from meats and skin from poultry before cooking.  Add spices, herbs, lemon juice or vinegar to vegetables instead of salt, rich sauces or gravies.  Use a non-stick skillet without fat or use no-stick sprays.  Cool and refrigerate stews and broth. Then remove the hardened fat floating on the surface before serving.  Refrigerate meat drippings and skim off fat to make low-fat gravies.  Serve more fish.  Use less butter, margarine and other high-fat spreads on bread or vegetables.  Use skim or reconstituted non-fat dry milk for cooking.  Cook with low-fat cheeses.  Substitute low-fat yogurt or cottage cheese for all or part of the sour cream in recipes for sauces, dips or congealed salads.  Use half yogurt/half mayonnaise in salad recipes.  Substitute evaporated skim milk for cream. Evaporated skim milk or reconstituted non-fat dry milk can be whipped and substituted for whipped cream in certain recipes.  Choose fresh fruits for dessert instead of high-fat foods such as pies or cakes. Fruits are naturally low in fat. When Dining Out   Order low-fat  appetizers such as fruit or vegetable juice, pasta with vegetables or tomato sauce.  Select clear, rather than cream soups.  Ask that dressings and gravies be served on the side. Then use less of them.  Order foods that are baked, broiled, poached, steamed, stir-fried, or roasted.  Ask for margarine instead of butter, and use only a small amount.  Drink sparkling water, unsweetened tea or coffee, or diet soft drinks instead of alcohol or other sweet beverages. QUESTIONS AND ANSWERS ABOUT OTHER FATS IN THE BLOOD: SATURATED FAT, TRANS FAT, AND CHOLESTEROL What is trans fat? Trans fat is a type of fat that is formed when vegetable oil is hardened through a process called hydrogenation. This process helps makes foods more solid, gives them shape, and prolongs their shelf life. Trans fats are also called hydrogenated or partially hydrogenated oils.  What do saturated fat, trans fat, and cholesterol in foods have to do with heart disease? Saturated fat, trans fat, and cholesterol in the diet all raise the level of LDL "bad" cholesterol in the blood. The higher the LDL cholesterol, the greater the risk for coronary heart disease (CHD). Saturated fat and trans fat raise LDL similarly.  What foods contain saturated fat, trans fat, and cholesterol? High amounts of saturated fat are found in animal products, such as fatty cuts of meat, chicken skin, and full-fat dairy products like butter, whole milk, cream, and cheese, and in tropical vegetable oils such as palm, palm kernel, and coconut oil. Trans fat is found in some of the same foods as saturated fat, such as vegetable shortening, some margarines (especially hard or stick margarine), crackers, cookies, baked goods, fried foods, salad dressings, and other processed foods made with partially hydrogenated vegetable oils. Small amounts of trans fat also occur naturally in some animal products, such as milk products, beef, and lamb. Foods high in cholesterol  include liver, other organ meats, egg yolks, shrimp, and full-fat dairy products. How can I use the new food label to make heart-healthy food choices? Check the Nutrition Facts panel of the food label. Choose foods lower in saturated fat, trans fat, and cholesterol. For saturated fat and cholesterol, you can also use the Percent Daily Value (%DV): 5% DV or less is low, and 20% DV or more is high. (There is no %DV for trans fat.) Use the Nutrition Facts panel to choose foods low in saturated fat and cholesterol, and if the trans fat is not listed, read the ingredients and limit products that list shortening or hydrogenated or partially hydrogenated vegetable oil, which tend to be high in trans fat. POINTS TO REMEMBER:   Discuss your risk for heart disease with your caregivers, and take steps to reduce risk factors.  Change your diet. Choose foods that are low in saturated fat, trans fat, and cholesterol.  Add exercise to your daily routine if it is not already being done. Participate in physical activity of moderate intensity, like brisk walking, for at least 30 minutes on most, and preferably all days of the week. No time? Break the 30 minutes into three, 10-minute segments during the day.  Stop smoking. If you do smoke, contact your caregiver to discuss ways in which they can help you quit.  Do not use street drugs.  Maintain a normal weight.  Maintain a healthy blood pressure.  Keep up with your blood work for checking the fats in your blood as directed by your caregiver. Document Released: 05/22/2004 Document Revised: 02/03/2012 Document Reviewed: 12/18/2008 [facility] Patient Information 2014 [place].

## 2014-01-20 NOTE — Progress Notes (Signed)
Subjective:    Patient ID: Carol Tucker, female    DOB: November 10, 1940, 73 y.o.   MRN: [id number] Chief Complaint  Patient presents with  . Follow-up    PREVIOUS OFFICE VISIT AND CHECK CHOLESTEROL    HPI Checking BP once a week and systolic usually running low 341D with diastolic in 62I. Has not scheduled mammogram yet - but is due in several mos - last was 04/13/2013 so she knows to sched her dexa a long with this this year Fasting today. Is on fish oil but no longer taking coenzyme q10.  She is very conflicted about restarting cholesterol medication and has talked with her family and friends about this. Did not have any side effects to the pravastatin previously.  - unsure when priordexa - around 2010 to 2013 at [place] - we do not have records. Has been taking vitamin D and calcium  Had Colonoscopy scheduled with Dr. Henrene Pastor on Nov 10. We increased pt's hctz from 12.5 to 25 at her last OV but then she must have emailed in with high blood pressure so we must have changed her lisionpril-hctz at that point. Now pressures are running 120s/60-70s at home.  Past Medical History  Diagnosis Date  . Hypertension   . Hyperlipidemia   . GERD (gastroesophageal reflux disease)   . Osteopenia 8-04  . Allergy   . Cancer   . Depression   . Anxiety   . Prediabetes    Current Outpatient Prescriptions on File Prior to Visit  Medication Sig Dispense Refill  . aspirin EC 81 MG tablet Take 81 mg by mouth daily.      . calcium carbonate (OS-CAL) 600 MG TABS tablet Take 600 mg by mouth 2 (two) times daily with a meal.      . cholecalciferol (VITAMIN D) 1000 UNITS tablet Take 1,000 Units by mouth 2 (two) times daily.      . famotidine (PEPCID) 20 MG tablet Take 1 tablet (20 mg total) by mouth 2 (two) times daily. PATIENT NEEDS OFFICE VISIT FOR ADDITIONAL REFILLS  60 tablet  0  . fish oil-omega-3 fatty acids 1000 MG capsule Take 1 g by mouth daily.      Marland Kitchen lisinopril-hydrochlorothiazide  (PRINZIDE,ZESTORETIC) 10-12.5 MG per tablet TAKE 1 TABLET BY MOUTH ONCE A DAY  90 tablet  0  . naphazoline (CLEAR EYES) 0.012 % ophthalmic solution Place 1 drop into both eyes 4 (four) times daily as needed. For dry eyes      . Coenzyme Q10 (CO Q 10 PO) Take 300 mg by mouth.      Marland Kitchen HYDROcodone-acetaminophen (NORCO) 5-325 MG per tablet Take 1 tablet by mouth every 6 (six) hours as needed.  20 tablet  0   No current facility-administered medications on file prior to visit.   Allergies  Allergen Reactions  . Metoclopramide Hcl     REACTION: tremors, clinched jaws     REGLAN    Review of Systems  Constitutional: Positive for fatigue. Negative for fever, chills, diaphoresis and appetite change.  Eyes: Negative for visual disturbance.  Respiratory: Negative for cough and shortness of breath.   Cardiovascular: Negative for chest pain, palpitations and leg swelling.  Gastrointestinal: Positive for constipation.  Genitourinary: Negative for decreased urine volume.  Neurological: Negative for syncope and headaches.  Hematological: Does not bruise/bleed easily.      Blood pressure 122/60, pulse 64, temperature 97.9 F (36.6 C), temperature source Oral, resp. rate 16, height 5\' 3"  (1.6 m), weight  143 lb 9.6 oz (65.137 kg), SpO2 97.00%. Objective:   Physical Exam  Constitutional: She is oriented to person, place, and time. She appears well-developed and well-nourished. No distress.  HENT:  Head: Normocephalic and atraumatic.  Right Ear: External ear normal.  Left Ear: External ear normal.  Eyes: Conjunctivae are normal. No scleral icterus.  Neck: Normal range of motion. Neck supple. No thyromegaly present.  Cardiovascular: Normal rate, regular rhythm, normal heart sounds and intact distal pulses.   Pulmonary/Chest: Effort normal and breath sounds normal. No respiratory distress.  Musculoskeletal: She exhibits no edema.  Lymphadenopathy:    She has no cervical adenopathy.  Neurological: She  is alert and oriented to person, place, and time.  Skin: Skin is warm and dry. She is not diaphoretic. No erythema.  Psychiatric: She has a normal mood and affect. Her behavior is normal.          Assessment & Plan:  pts' ascvd risk score is currently 13.8% so rec high dose statin - w/ perfect chol risk could be reduced to 7.7% but pt concerned about risk of high dose and unsure if low dose would provide any sig benefit in risk reduction so she will increase fish oil to bid (poss 2 tabs bid) and will double her calcium intake, increase exercise. Recheck fasting labs at next f/u visit in 6 mos  HTN (hypertension) - Plan: Comprehensive metabolic panel, Lipid panel  Hyperlipidemia - Plan: Comprehensive metabolic panel, Vit D  25 hydroxy (rtn osteoporosis monitoring)  Osteopenia - Plan: Vit D  25 hydroxy (rtn osteoporosis monitoring)  Unspecified vitamin D deficiency - Plan: Vit D  25 hydroxy (rtn osteoporosis monitoring)  Encounter for long-term (current) use of other medications - Plan: Comprehensive metabolic panel, Lipid panel, Vit D  25 hydroxy (rtn osteoporosis monitoring)  Need for pneumococcal vaccination - Plan: CANCELED: Pneumococcal conjugate vaccine 13-valent IM  Meds ordered this encounter  Medications  . lisinopril-hydrochlorothiazide (PRINZIDE,ZESTORETIC) 10-12.5 MG per tablet    Sig: TAKE 1 TABLET BY MOUTH ONCE A DAY    Dispense:  90 tablet    Refill:  3  . famotidine (PEPCID) 20 MG tablet    Sig: Take 1 tablet (20 mg total) by mouth 2 (two) times daily.    Dispense:  180 tablet    Refill:  3     Delman Cheadle, MD MPH

## 2014-01-21 LAB — VITAMIN D 25 HYDROXY (VIT D DEFICIENCY, FRACTURES): VIT D 25 HYDROXY: 45 ng/mL (ref 30–89)

## 2014-01-24 ENCOUNTER — Encounter: Payer: Self-pay | Admitting: Family Medicine

## 2014-02-27 ENCOUNTER — Other Ambulatory Visit: Payer: Self-pay | Admitting: Physician Assistant

## 2014-02-27 ENCOUNTER — Other Ambulatory Visit: Payer: Self-pay | Admitting: Family Medicine

## 2014-04-05 ENCOUNTER — Encounter: Payer: Self-pay | Admitting: Family Medicine

## 2014-04-05 DIAGNOSIS — Z780 Asymptomatic menopausal state: Secondary | ICD-10-CM

## 2014-04-05 DIAGNOSIS — M8580 Other specified disorders of bone density and structure, unspecified site: Secondary | ICD-10-CM

## 2014-04-06 NOTE — Telephone Encounter (Signed)
DEXA scan was ordered on 10/29/12 and never done. Put in a new order but will need to be faxed over to [facility]. Thanks.

## 2014-04-14 ENCOUNTER — Other Ambulatory Visit: Payer: Self-pay | Admitting: Physician Assistant

## 2014-04-14 LAB — HM MAMMOGRAPHY

## 2014-04-14 LAB — HM DEXA SCAN

## 2014-05-01 ENCOUNTER — Encounter: Payer: Self-pay | Admitting: Family Medicine

## 2014-05-31 ENCOUNTER — Encounter: Payer: Self-pay | Admitting: Family Medicine

## 2014-06-19 ENCOUNTER — Encounter: Payer: Self-pay | Admitting: Family Medicine

## 2014-07-28 ENCOUNTER — Ambulatory Visit (INDEPENDENT_AMBULATORY_CARE_PROVIDER_SITE_OTHER): Payer: PRIVATE HEALTH INSURANCE | Admitting: Family Medicine

## 2014-07-28 ENCOUNTER — Encounter: Payer: Self-pay | Admitting: Family Medicine

## 2014-07-28 VITALS — BP 110/60 | HR 68 | Temp 98.4°F | Resp 18 | Ht 63.0 in | Wt 148.0 lb

## 2014-07-28 DIAGNOSIS — M8580 Other specified disorders of bone density and structure, unspecified site: Secondary | ICD-10-CM

## 2014-07-28 DIAGNOSIS — I10 Essential (primary) hypertension: Secondary | ICD-10-CM

## 2014-07-28 DIAGNOSIS — R7303 Prediabetes: Secondary | ICD-10-CM

## 2014-07-28 DIAGNOSIS — E785 Hyperlipidemia, unspecified: Secondary | ICD-10-CM

## 2014-07-28 DIAGNOSIS — Z79899 Other long term (current) drug therapy: Secondary | ICD-10-CM

## 2014-07-28 DIAGNOSIS — R7309 Other abnormal glucose: Secondary | ICD-10-CM

## 2014-07-28 LAB — COMPREHENSIVE METABOLIC PANEL
ALBUMIN: 4.4 g/dL (ref 3.5–5.2)
ALT: 9 U/L (ref 0–35)
AST: 12 U/L (ref 0–37)
Alkaline Phosphatase: 60 U/L (ref 39–117)
BUN: 19 mg/dL (ref 6–23)
CALCIUM: 9.3 mg/dL (ref 8.4–10.5)
CHLORIDE: 101 meq/L (ref 96–112)
CO2: 27 meq/L (ref 19–32)
Creat: 0.98 mg/dL (ref 0.50–1.10)
Glucose, Bld: 119 mg/dL — ABNORMAL HIGH (ref 70–99)
Potassium: 4.9 mEq/L (ref 3.5–5.3)
Sodium: 137 mEq/L (ref 135–145)
TOTAL PROTEIN: 7.1 g/dL (ref 6.0–8.3)
Total Bilirubin: 0.4 mg/dL (ref 0.2–1.2)

## 2014-07-28 LAB — LIPID PANEL
Cholesterol: 241 mg/dL — ABNORMAL HIGH (ref 0–200)
HDL: 43 mg/dL (ref >39)
LDL CALC: 171 mg/dL — AB (ref 0–99)
TRIGLYCERIDES: 136 mg/dL (ref <150)
Total CHOL/HDL Ratio: 5.6 Ratio
VLDL: 27 mg/dL (ref 0–40)

## 2014-07-28 LAB — POCT GLYCOSYLATED HEMOGLOBIN (HGB A1C): Hemoglobin A1C: 5.8

## 2014-07-28 MED ORDER — LISINOPRIL-HYDROCHLOROTHIAZIDE 10-12.5 MG PO TABS
1.0000 | ORAL_TABLET | Freq: Every day | ORAL | Status: DC
Start: 2014-07-28 — End: 2015-08-28

## 2014-07-28 NOTE — Patient Instructions (Signed)
Let me know if you want to switch over to prescription fish oil - Lovaza - this would still be 2 tabs twice a day.  Food Choices to Lower Your Triglycerides  Triglycerides are a type of fat in your blood. High levels of triglycerides can increase the risk of heart disease and stroke. If your triglyceride levels are high, the foods you eat and your eating habits are very important. Choosing the right foods can help lower your triglycerides.  WHAT GENERAL GUIDELINES DO I NEED TO FOLLOW?  Lose weight if you are overweight.   Limit or avoid alcohol.   Fill one half of your plate with vegetables and green salads.   Limit fruit to two servings a day. Choose fruit instead of juice.   Make one fourth of your plate whole grains. Look for the word "whole" as the first word in the ingredient list.  Fill one fourth of your plate with lean protein foods.  Enjoy fatty fish (such as salmon, mackerel, sardines, and tuna) three times a week.   Choose healthy fats.   Limit foods high in starch and sugar.  Eat more home-cooked food and less restaurant, buffet, and fast food.  Limit fried foods.  Cook foods using methods other than frying.  Limit saturated fats.  Check ingredient lists to avoid foods with partially hydrogenated oils (trans fats) in them. WHAT FOODS CAN I EAT?  Grains Whole grains, such as whole wheat or whole grain breads, crackers, cereals, and pasta. Unsweetened oatmeal, bulgur, barley, quinoa, or brown rice. Corn or whole wheat flour tortillas.  Vegetables Fresh or frozen vegetables (raw, steamed, roasted, or grilled). Green salads. Fruits All fresh, canned (in natural juice), or frozen fruits. Meat and Other Protein Products Ground beef (85% or leaner), grass-fed beef, or beef trimmed of fat. Skinless chicken or [place]. Ground chicken or [place]. Pork trimmed of fat. All fish and seafood. Eggs. Dried beans, peas, or lentils. Unsalted nuts or seeds. Unsalted canned or  dry beans. Dairy Low-fat dairy products, such as skim or 1% milk, 2% or reduced-fat cheeses, low-fat ricotta or cottage cheese, or plain low-fat yogurt. Fats and Oils Tub margarines without trans fats. Light or reduced-fat mayonnaise and salad dressings. Avocado. Safflower, olive, or canola oils. Natural peanut or almond butter. The items listed above may not be a complete list of recommended foods or beverages. Contact your dietitian for more options. WHAT FOODS ARE NOT RECOMMENDED?  Grains White bread. White pasta. White rice. Cornbread. Bagels, pastries, and croissants. Crackers that contain trans fat. Vegetables White potatoes. Corn. Creamed or fried vegetables. Vegetables in a cheese sauce. Fruits Dried fruits. Canned fruit in light or heavy syrup. Fruit juice. Meat and Other Protein Products Fatty cuts of meat. Ribs, chicken wings, bacon, sausage, bologna, salami, chitterlings, fatback, hot dogs, bratwurst, and packaged luncheon meats. Dairy Whole or 2% milk, cream, half-and-half, and cream cheese. Whole-fat or sweetened yogurt. Full-fat cheeses. Nondairy creamers and whipped toppings. Processed cheese, cheese spreads, or cheese curds. Sweets and Desserts Corn syrup, sugars, honey, and molasses. Candy. Jam and jelly. Syrup. Sweetened cereals. Cookies, pies, cakes, donuts, muffins, and ice cream. Fats and Oils Butter, stick margarine, lard, shortening, ghee, or bacon fat. Coconut, palm kernel, or palm oils. Beverages Alcohol. Sweetened drinks (such as sodas, lemonade, and fruit drinks or punches). The items listed above may not be a complete list of foods and beverages to avoid. Contact your dietitian for more information. Document Released: 05/22/2004 Document Revised: 08/09/2013 Document Reviewed: 06/08/2013  Exit[employer] Patient Information 2015 [place]. This information is not intended to replace advice given to you by your health care provider. Make sure you discuss any  questions you have with your health care provider. Cholesterol Cholesterol is a white, waxy, fat-like substance needed by your body in small amounts. The liver makes all the cholesterol you need. Cholesterol is carried from the liver by the blood through the blood vessels. Deposits of cholesterol (plaque) may build up on blood vessel walls. These make the arteries narrower and stiffer. Cholesterol plaques increase the risk for heart attack and stroke.  You cannot feel your cholesterol level even if it is very high. The only way to know it is high is with a blood test. Once you know your cholesterol levels, you should keep a record of the test results. Work with your health care provider to keep your levels in the desired range.  WHAT DO THE RESULTS MEAN?  Total cholesterol is a rough measure of all the cholesterol in your blood.   LDL is the so-called bad cholesterol. This is the type that deposits cholesterol in the walls of the arteries. You want this level to be low.   HDL is the good cholesterol because it cleans the arteries and carries the LDL away. You want this level to be high.  Triglycerides are fat that the body can either burn for energy or store. High levels are closely linked to heart disease.  WHAT ARE THE DESIRED LEVELS OF CHOLESTEROL?  Total cholesterol below 200.   LDL below 100 for people at risk, below 70 for those at very high risk.   HDL above 50 is good, above 60 is best.   Triglycerides below 150.  HOW CAN I LOWER MY CHOLESTEROL?  Diet. Follow your diet programs as directed by your health care provider.   Choose fish or white meat chicken and [place], roasted or baked. Limit fatty cuts of red meat, fried foods, and processed meats, such as sausage and lunch meats.   Eat lots of fresh fruits and vegetables.  Choose whole grains, beans, pasta, potatoes, and cereals.   Use only small amounts of olive, corn, or canola oils.   Avoid butter, mayonnaise,  shortening, or palm kernel oils.  Avoid foods with trans fats.   Drink skim or nonfat milk and eat low-fat or nonfat yogurt and cheeses. Avoid whole milk, cream, ice cream, egg yolks, and full-fat cheeses.   Healthy desserts include angel food cake, ginger snaps, animal crackers, hard candy, popsicles, and low-fat or nonfat frozen yogurt. Avoid pastries, cakes, pies, and cookies.   Exercise. Follow your exercise programs as directed by your health care provider.   A regular program helps decrease LDL and raise HDL.   A regular program helps with weight control.   Do things that increase your activity level like gardening, walking, or taking the stairs. Ask your health care provider about how you can be more active in your daily life.   Medicine. Take medicine only as directed by your health care provider.   Medicine may be prescribed by your health care provider to help lower cholesterol and decrease the risk for heart disease.   If you have several risk factors, you may need medicine even if your levels are normal. Document Released: 04/29/2001 Document Revised: 12/19/2013 Document Reviewed: 05/18/2013 [facility] Patient Information 2015 [place], [place]. This information is not intended to replace advice given to you by your health care provider. Make sure you discuss any  questions you have with your health care provider.  

## 2014-07-28 NOTE — Progress Notes (Addendum)
Subjective:    Patient ID: Carol Tucker, female    DOB: 10/13/1940, 73 y.o.   MRN: [id number] This chart was scribed for Delman Cheadle, MD by Zola Button, Medical Scribe. This patient was seen in Room 25 and the patient's care was started at 8:25 AM.  Chief Complaint  Patient presents with  . Hypertension    HPI HPI Comments: THAILYN KHALID is a 73 y.o. female who presents to the [facility] for a follow-up.  Pt has hx of HLD. At last visit, CVD risk score was 13.8% and patient was concerned about statin side effects. She instead opted to increase her fish oil supplements to 2 tabs twice a day. Will recheck cholesterol today. She also has hx of osteopenia. She was advised to double calcium intake and increase exercise. Patient also has mild pre-diabetes; previously, her HGB A1C was 5.7 in Sept 2014. Last dexa scan was August of this year.  Patient takes her BP at home, but she has not been taking it lately. She has been taking her fish oil 2 tabs a day on most days. She is doing well on calcium and vitamin D; she has also been walking about 3 times a week while adding some weights.  Past Medical History  Diagnosis Date  . Hypertension   . Hyperlipidemia   . GERD (gastroesophageal reflux disease)   . Osteopenia 8-04    DEXA at [facility] 04/14/14 with lowest Tscore -1.7 at Left femoral neck. FRAX does not indicate need for bisphosphonate but density has sig worsened since prior dexa on 12/17/2009  . Allergy   . Cancer   . Depression   . Anxiety   . Prediabetes    Current Outpatient Prescriptions on File Prior to Visit  Medication Sig Dispense Refill  . aspirin EC 81 MG tablet Take 81 mg by mouth daily.    . calcium carbonate (OS-CAL) 600 MG TABS tablet Take 600 mg by mouth 2 (two) times daily with a meal.    . cholecalciferol (VITAMIN D) 1000 UNITS tablet Take 1,000 Units by mouth 2 (two) times daily.    . famotidine (PEPCID) 20 MG tablet TAKE 1 TABLET BY MOUTH  TWICE A DAY 180 tablet 2  . fish oil-omega-3 fatty acids 1000 MG capsule Take 1 g by mouth daily.    . naphazoline (CLEAR EYES) 0.012 % ophthalmic solution Place 1 drop into both eyes 4 (four) times daily as needed. For dry eyes     No current facility-administered medications on file prior to visit.   Allergies  Allergen Reactions  . Metoclopramide Hcl     REACTION: tremors, clinched jaws     REGLAN     Review of Systems  Constitutional: Negative for fever, chills, diaphoresis and appetite change.  Eyes: Negative for visual disturbance.  Respiratory: Negative for cough and shortness of breath.   Cardiovascular: Negative for chest pain, palpitations and leg swelling.  Gastrointestinal: Negative for nausea and abdominal pain.  Genitourinary: Negative for decreased urine volume.  Neurological: Negative for syncope and headaches.  Hematological: Does not bruise/bleed easily.  Psychiatric/Behavioral: Negative for confusion.       Objective:   Physical Exam  Constitutional: She is oriented to person, place, and time. She appears well-developed and well-nourished. No distress.  HENT:  Head: Normocephalic and atraumatic.  Mouth/Throat: Oropharynx is clear and moist. No oropharyngeal exudate.  Eyes: Pupils are equal, round, and reactive to light.  Neck: Neck supple. No  thyromegaly present.  Normal thyroid.  Cardiovascular: Normal rate, regular rhythm, S1 normal, S2 normal and normal heart sounds.   No murmur heard. BP recheck: 110/60  Pulmonary/Chest: Effort normal and breath sounds normal. No respiratory distress. She has no wheezes. She has no rales.  Musculoskeletal: She exhibits no edema.  Lymphadenopathy:    She has no cervical adenopathy.  Neurological: She is alert and oriented to person, place, and time. No cranial nerve deficit.  Skin: Skin is warm and dry. No rash noted.  Psychiatric: She has a normal mood and affect. Her behavior is normal.  Nursing note and vitals  reviewed. BP 110/60 mmHg  Pulse 68  Temp(Src) 98.4 F (36.9 C)  Resp 18  Ht 5\' 3"  (1.6 m)  Wt 148 lb (67.132 kg)  BMI 26.22 kg/m2  SpO2 98%     Assessment & Plan:   Essential hypertension - Plan: Comprehensive metabolic panel - well controlled, medication refilled.  Hyperlipidemia - Plan: Lipid panel, Comprehensive metabolic panel - pt has been working very hard on tlc - fish oil 2 tabs bid and exercise - recheck and calculate risk score - pt willing to start on statin medication if not significantly improved. ASCVD risk improved from 13.8% to 11.5% but still above ideal score of 8.8% so will start pravastatin. Recheck flp in 6 mos - pt very concerned about side effects of statin so will keep a close eye on liver and a1c - if pt want to come in for lab only visit in 3 mos for lfts and a1c that is fine.  Osteopenia - doing bid calcium and vit D supp - vit D nml 6 mos prior, cont weight bearing exercise. Due for dexa 03/2016  Prediabetes - Plan: POCT glycosylated hemoglobin (Hb A1C) - prior a1c 5.7  >1 yr ago - is 5.8 today  Medication management - Plan: Comprehensive metabolic panel  Meds ordered this encounter  Medications  . lisinopril-hydrochlorothiazide (PRINZIDE,ZESTORETIC) 10-12.5 MG per tablet    Sig: Take 1 tablet by mouth daily.    Dispense:  90 tablet    Refill:  3    I personally performed the services described in this documentation, which was scribed in my presence. The recorded information has been reviewed and considered, and addended by me as needed.  Delman Cheadle, MD MPH  Results for orders placed or performed in visit on 07/28/14  Lipid panel  Result Value Ref Range   Cholesterol 241 (H) 0 - 200 mg/dL   Triglycerides 136 <150 mg/dL   HDL 43 >39 mg/dL   Total CHOL/HDL Ratio 5.6 Ratio   VLDL 27 0 - 40 mg/dL   LDL Cholesterol 171 (H) 0 - 99 mg/dL  Comprehensive metabolic panel  Result Value Ref Range   Sodium 137 135 - 145 mEq/L   Potassium 4.9 3.5 - 5.3 mEq/L     Chloride 101 96 - 112 mEq/L   CO2 27 19 - 32 mEq/L   Glucose, Bld 119 (H) 70 - 99 mg/dL   BUN 19 6 - 23 mg/dL   Creat 0.98 0.50 - 1.10 mg/dL   Total Bilirubin 0.4 0.2 - 1.2 mg/dL   Alkaline Phosphatase 60 39 - 117 U/L   AST 12 0 - 37 U/L   ALT 9 0 - 35 U/L   Total Protein 7.1 6.0 - 8.3 g/dL   Albumin 4.4 3.5 - 5.2 g/dL   Calcium 9.3 8.4 - 10.5 mg/dL  POCT glycosylated hemoglobin (Hb A1C)  Result  Value Ref Range   Hemoglobin A1C 5.8

## 2014-08-16 MED ORDER — PRAVASTATIN SODIUM 40 MG PO TABS: 40.0000 mg | ORAL_TABLET | Freq: Every day | ORAL | Status: DC

## 2014-08-16 NOTE — Addendum Note (Signed)
Addended by: Delman Cheadle on: 08/16/2014 10:21 AM   Modules accepted: Orders

## 2014-11-14 ENCOUNTER — Ambulatory Visit (INDEPENDENT_AMBULATORY_CARE_PROVIDER_SITE_OTHER): Payer: Medicare Other | Admitting: Family Medicine

## 2014-11-14 VITALS — BP 142/64 | HR 75 | Temp 97.5°F | Resp 16 | Ht 62.75 in | Wt 149.0 lb

## 2014-11-14 DIAGNOSIS — F411 Generalized anxiety disorder: Secondary | ICD-10-CM | POA: Diagnosis not present

## 2014-11-14 DIAGNOSIS — I10 Essential (primary) hypertension: Secondary | ICD-10-CM | POA: Diagnosis not present

## 2014-11-14 DIAGNOSIS — E785 Hyperlipidemia, unspecified: Secondary | ICD-10-CM | POA: Diagnosis not present

## 2014-11-14 DIAGNOSIS — R312 Other microscopic hematuria: Secondary | ICD-10-CM | POA: Diagnosis not present

## 2014-11-14 DIAGNOSIS — R3129 Other microscopic hematuria: Secondary | ICD-10-CM

## 2014-11-14 DIAGNOSIS — K219 Gastro-esophageal reflux disease without esophagitis: Secondary | ICD-10-CM | POA: Diagnosis not present

## 2014-11-14 DIAGNOSIS — R079 Chest pain, unspecified: Secondary | ICD-10-CM | POA: Diagnosis not present

## 2014-11-14 LAB — COMPREHENSIVE METABOLIC PANEL
ALK PHOS: 46 U/L (ref 39–117)
ALT: 12 U/L (ref 0–35)
AST: 13 U/L (ref 0–37)
Albumin: 4.3 g/dL (ref 3.5–5.2)
BUN: 17 mg/dL (ref 6–23)
CHLORIDE: 102 meq/L (ref 96–112)
CO2: 25 mEq/L (ref 19–32)
Calcium: 9.5 mg/dL (ref 8.4–10.5)
Creat: 0.83 mg/dL (ref 0.50–1.10)
Glucose, Bld: 151 mg/dL — ABNORMAL HIGH (ref 70–99)
POTASSIUM: 4.1 meq/L (ref 3.5–5.3)
Sodium: 137 mEq/L (ref 135–145)
TOTAL PROTEIN: 7.4 g/dL (ref 6.0–8.3)
Total Bilirubin: 0.4 mg/dL (ref 0.2–1.2)

## 2014-11-14 LAB — POCT CBC
Granulocyte percent: 67 %G (ref 37–80)
HEMATOCRIT: 39 % (ref 37.7–47.9)
Hemoglobin: 12.5 g/dL (ref 12.2–16.2)
LYMPH, POC: 2 (ref 0.6–3.4)
MCH: 29.7 pg (ref 27–31.2)
MCHC: 31.9 g/dL (ref 31.8–35.4)
MCV: 93.1 fL (ref 80–97)
MID (cbc): 0.3 (ref 0–0.9)
MPV: 7.4 fL (ref 0–99.8)
POC GRANULOCYTE: 4.6 (ref 2–6.9)
POC LYMPH %: 28.9 % (ref 10–50)
POC MID %: 4.1 %M (ref 0–12)
Platelet Count, POC: 265 10*3/uL (ref 142–424)
RBC: 4.19 M/uL (ref 4.04–5.48)
RDW, POC: 13.8 %
WBC: 6.8 10*3/uL (ref 4.6–10.2)

## 2014-11-14 LAB — POCT UA - MICROSCOPIC ONLY
Bacteria, U Microscopic: NEGATIVE
Casts, Ur, LPF, POC: NEGATIVE
Crystals, Ur, HPF, POC: NEGATIVE
Epithelial cells, urine per micros: NEGATIVE
Mucus, UA: NEGATIVE
WBC, Ur, HPF, POC: NEGATIVE
Yeast, UA: NEGATIVE

## 2014-11-14 LAB — POCT URINALYSIS DIPSTICK
Bilirubin, UA: NEGATIVE
GLUCOSE UA: NEGATIVE
Ketones, UA: NEGATIVE
LEUKOCYTES UA: NEGATIVE
Nitrite, UA: NEGATIVE
PH UA: 5.5
PROTEIN UA: NEGATIVE
Spec Grav, UA: 1.01
Urobilinogen, UA: 0.2

## 2014-11-14 LAB — GLUCOSE, POCT (MANUAL RESULT ENTRY): POC Glucose: 133 mg/dl — AB (ref 70–99)

## 2014-11-14 LAB — POCT SEDIMENTATION RATE: POCT SED RATE: 32 mm/hr — AB (ref 0–22)

## 2014-11-14 LAB — TROPONIN I

## 2014-11-14 LAB — POCT GLYCOSYLATED HEMOGLOBIN (HGB A1C): Hemoglobin A1C: 5.7

## 2014-11-14 MED ORDER — ALPRAZOLAM 0.5 MG PO TBDP: 0.5000 mg | ORAL_TABLET | Freq: Three times a day (TID) | ORAL | Status: DC | PRN

## 2014-11-14 MED ORDER — ASPIRIN 81 MG PO CHEW
324.0000 mg | CHEWABLE_TABLET | Freq: Once | ORAL | Status: AC
  Administered 2014-11-14: 324 mg via ORAL

## 2014-11-14 MED ORDER — NITROGLYCERIN 0.3 MG SL SUBL: 0.4000 mg | SUBLINGUAL_TABLET | SUBLINGUAL | Status: DC | PRN

## 2014-11-14 NOTE — Progress Notes (Signed)
Subjective:    Patient ID: Carol Tucker, female    DOB: 27-Mar-1941, 74 y.o.   MRN: [id number]  This chart was scribed for Carol Knapp, MD by Stephania Fragmin, ED Scribe. This patient was seen in room 8 and the patient's care was started at 10:48 AM.   Chief Complaint  Patient presents with  . panic attack    started on last night; short period of Shortness of breath; has hx of panic attack  . burning feeling in chest    has family hx of heart problems  . shaking  . dry mouth    HPI  HPI Comments: Carol Tucker is a 74 y.o. female who presents to the [facility] complaining of a panic attack that occurred this morning. Patient states she woke up this morning at 1 AM due to hand tremors associated with carpal tunnel syndrome. She then had a panic attack that lasted 1 hour, accompanied by dry mouth and shaking. She also reports chest pain/tightness, stating that she felt a "heat" in her chest that felt like pressure and burning. She denies any pain radiation to her back or arms. Patient reports some SOB that she attributes to nervousness, which has since resolved, and adds that the tremors last night have also since resolved. She still feels some mild residual chest burning. Patient is concerned about the possibility of having a MI with atypical presentation, as she was once hospitalized for a week before she was finally diagnosed with appendicitis with exploratory surgery.   Patient has a history of 2 panic attacks, with one that occurred 4 years ago and another that occurred 3 years ago. She reports that this episode is similar to previous panic attacks, except this episode is comparatively more mild. Her first panic attack was accompanied by nausea, and she was taken to the hospital both times with cardiac workups that were normal.   This morning, patient has taken lisinopril-hydrochlorothiazide, aspirin, pravastatin, and Pepcid. She also takes a calcium supplement  regularly. Patient regularly measures her blood pressure at home, which normally hovers around 130/70.   Patient states that her GERD usually only trigger stomachaches, rather than chest pain, and states that this does not feel like GERD. She adds that she did not eat anything this morning.   Patient denies any sick contact. She denies any new or recent stressors. She also denies diaphoresis, nausea, headaches, lightheadedness or dizziness, wheezing, cough, sore throat, rhinorrhea, nasal congestion, palpitations, leg swelling, dyspnea on lying supine, or any GI symptoms, including urinary or fecal symptoms.   She states she has been having trouble with carpal tunnel, which has been affecting her sleep.   Past Medical History  Diagnosis Date  . Hypertension   . Hyperlipidemia   . GERD (gastroesophageal reflux disease)   . Osteopenia 8-04    DEXA at [facility] 04/14/14 with lowest Tscore -1.7 at Left femoral neck. FRAX does not indicate need for bisphosphonate but density has sig worsened since prior dexa on 12/17/2009  . Allergy   . Cancer   . Depression   . Anxiety   . Prediabetes    Current Outpatient Prescriptions on File Prior to Visit  Medication Sig Dispense Refill  . aspirin EC 81 MG tablet Take 81 mg by mouth daily.    . calcium carbonate (OS-CAL) 600 MG TABS tablet Take 600 mg by mouth 2 (two) times daily with a meal.    . cholecalciferol (VITAMIN D) 1000  UNITS tablet Take 1,000 Units by mouth 2 (two) times daily.    . famotidine (PEPCID) 20 MG tablet TAKE 1 TABLET BY MOUTH TWICE A DAY 180 tablet 2  . fish oil-omega-3 fatty acids 1000 MG capsule Take 1 g by mouth daily.    Marland Kitchen lisinopril-hydrochlorothiazide (PRINZIDE,ZESTORETIC) 10-12.5 MG per tablet Take 1 tablet by mouth daily. 90 tablet 3  . naphazoline (CLEAR EYES) 0.012 % ophthalmic solution Place 1 drop into both eyes 4 (four) times daily as needed. For dry eyes    . pravastatin (PRAVACHOL) 40 MG tablet Take 1 tablet (40 mg total)  by mouth daily. 90 tablet 1   No current facility-administered medications on file prior to visit.   Allergies  Allergen Reactions  . Metoclopramide Hcl     REACTION: tremors, clinched jaws     REGLAN    Review of Systems  Constitutional: Negative for fever, diaphoresis, appetite change and unexpected weight change.  HENT: Negative for congestion, rhinorrhea and sore throat.   Respiratory: Positive for shortness of breath (resolved). Negative for cough and wheezing.   Cardiovascular: Positive for chest pain. Negative for palpitations and leg swelling.  Gastrointestinal: Negative for nausea, vomiting, abdominal pain, diarrhea, constipation and blood in stool.  Genitourinary: Negative for dysuria, urgency, frequency, hematuria, decreased urine volume and difficulty urinating.  Musculoskeletal: Positive for myalgias and arthralgias.  Neurological: Positive for weakness and numbness. Negative for dizziness, light-headedness and headaches.  Psychiatric/Behavioral: Positive for sleep disturbance (secondary to carpal tunnel syndrome). The patient is nervous/anxious.        Objective:  BP 142/64 mmHg  Pulse 75  Temp(Src) 97.5 F (36.4 C) (Oral)  Resp 16  Ht 5' 2.75" (1.594 m)  Wt 149 lb (67.586 kg)  BMI 26.60 kg/m2  SpO2 100%   Physical Exam  Constitutional: She is oriented to person, place, and time. She appears well-developed and well-nourished. No distress.  HENT:  Head: Normocephalic and atraumatic.  Eyes: Conjunctivae and EOM are normal.  Neck: Neck supple. No tracheal deviation present.  Tiny right nodule on lateral upper thyroid.  Cardiovascular: Normal rate, regular rhythm, S1 normal, S2 normal and normal heart sounds.   No murmur heard. Pulmonary/Chest: Effort normal. No respiratory distress.  Lungs are clear to auscultation.   Musculoskeletal: Normal range of motion.  Lymphadenopathy:    She has no cervical adenopathy.  Neurological: She is alert and oriented to  person, place, and time.  Skin: Skin is warm and dry.  Psychiatric: She has a normal mood and affect. Her behavior is normal.  Nursing note and vitals reviewed.  Results for orders placed or performed in visit on 11/14/14  Comprehensive metabolic panel  Result Value Ref Range   Sodium 137 135 - 145 mEq/L   Potassium 4.1 3.5 - 5.3 mEq/L   Chloride 102 96 - 112 mEq/L   CO2 25 19 - 32 mEq/L   Glucose, Bld 151 (H) 70 - 99 mg/dL   BUN 17 6 - 23 mg/dL   Creat 0.83 0.50 - 1.10 mg/dL   Total Bilirubin 0.4 0.2 - 1.2 mg/dL   Alkaline Phosphatase 46 39 - 117 U/L   AST 13 0 - 37 U/L   ALT 12 0 - 35 U/L   Total Protein 7.4 6.0 - 8.3 g/dL   Albumin 4.3 3.5 - 5.2 g/dL   Calcium 9.5 8.4 - 10.5 mg/dL  Troponin I  Result Value Ref Range   Troponin I <0.01 <0.06 ng/mL  POCT CBC  Result Value Ref Range   WBC 6.8 4.6 - 10.2 K/uL   Lymph, poc 2.0 0.6 - 3.4   POC LYMPH PERCENT 28.9 10 - 50 %L   MID (cbc) 0.3 0 - 0.9   POC MID % 4.1 0 - 12 %M   POC Granulocyte 4.6 2 - 6.9   Granulocyte percent 67.0 37 - 80 %G   RBC 4.19 4.04 - 5.48 M/uL   Hemoglobin 12.5 12.2 - 16.2 g/dL   HCT, POC 39.0 37.7 - 47.9 %   MCV 93.1 80 - 97 fL   MCH, POC 29.7 27 - 31.2 pg   MCHC 31.9 31.8 - 35.4 g/dL   RDW, POC 13.8 %   Platelet Count, POC 265 142 - 424 K/uL   MPV 7.4 0 - 99.8 fL  POCT UA - Microscopic Only  Result Value Ref Range   WBC, Ur, HPF, POC neg    RBC, urine, microscopic 0-5    Bacteria, U Microscopic neg    Mucus, UA neg    Epithelial cells, urine per micros neg    Crystals, Ur, HPF, POC neg    Casts, Ur, LPF, POC neg    Yeast, UA neg   POCT urinalysis dipstick  Result Value Ref Range   Color, UA yellow    Clarity, UA clear    Glucose, UA neg    Bilirubin, UA neg    Ketones, UA neg    Spec Grav, UA 1.010    Blood, UA moderate    pH, UA 5.5    Protein, UA neg    Urobilinogen, UA 0.2    Nitrite, UA neg    Leukocytes, UA Negative   POCT SEDIMENTATION RATE  Result Value Ref Range   POCT  SED RATE 32 (A) 0 - 22 mm/hr  POCT glucose (manual entry)  Result Value Ref Range   POC Glucose 133 (A) 70 - 99 mg/dl  POCT glycosylated hemoglobin (Hb A1C)  Result Value Ref Range   Hemoglobin A1C 5.7      [facility] reading (PRIMARY) by  Dr. Brigitte Pulse. EKG: NSR, no acute abnml or signs of ischemia     Assessment & Plan:   10:55 AM-Discussed treatment plan which includes EKG, recheck blood pressure, and medication (aspirin and sublingual NTG).  Microscopic hematuria  Chest pain, unspecified chest pain type - Plan: EKG 12-Lead, aspirin chewable tablet 324 mg, POCT CBC, POCT UA - Microscopic Only, POCT urinalysis dipstick, POCT SEDIMENTATION RATE, POCT glucose (manual entry), POCT glycosylated hemoglobin (Hb A1C), Comprehensive metabolic panel, Troponin I, DISCONTINUED: nitroGLYCERIN (NITROSTAT) SL tablet 0.3 mg - sxs atypical for ACS and did not get pain relief w/ SL nitro x 1 which is also reassuring  Gastroesophageal reflux disease, esophagitis presence not specified  Anxiety state  Essential hypertension, benign  Hyperlipidemia  Meds ordered this encounter  Medications  . aspirin chewable tablet 324 mg    Sig:   . DISCONTD: nitroGLYCERIN (NITROSTAT) SL tablet 0.3 mg    Sig:   . ALPRAZolam (NIRAVAM) 0.5 MG dissolvable tablet    Sig: Take 1 tablet (0.5 mg total) by mouth 3 (three) times daily as needed for anxiety.    Dispense:  20 tablet    Refill:  0    I personally performed the services described in this documentation, which was scribed in my presence. The recorded information has been reviewed and considered, and addended by me as needed.  Delman Cheadle, MD MPH

## 2014-11-14 NOTE — Patient Instructions (Signed)
Chest Pain (Nonspecific) It is often hard to give a specific diagnosis for the cause of chest pain. There is always a chance that your pain could be related to something serious, such as a heart attack or a blood clot in the lungs. You need to follow up with your health care provider for further evaluation. CAUSES   Heartburn.  Pneumonia or bronchitis.  Anxiety or stress.  Inflammation around your heart (pericarditis) or lung (pleuritis or pleurisy).  A blood clot in the lung.  A collapsed lung (pneumothorax). It can develop suddenly on its own (spontaneous pneumothorax) or from trauma to the chest.  Shingles infection (herpes zoster virus). The chest wall is composed of bones, muscles, and cartilage. Any of these can be the source of the pain.  The bones can be bruised by injury.  The muscles or cartilage can be strained by coughing or overwork.  The cartilage can be affected by inflammation and become sore (costochondritis). DIAGNOSIS  Lab tests or other studies may be needed to find the cause of your pain. Your health care provider may have you take a test called an ambulatory electrocardiogram (ECG). An ECG records your heartbeat patterns over a 24-hour period. You may also have other tests, such as:  Transthoracic echocardiogram (TTE). During echocardiography, sound waves are used to evaluate how blood flows through your heart.  Transesophageal echocardiogram (TEE).  Cardiac monitoring. This allows your health care provider to monitor your heart rate and rhythm in real time.  Holter monitor. This is a portable device that records your heartbeat and can help diagnose heart arrhythmias. It allows your health care provider to track your heart activity for several days, if needed.  Stress tests by exercise or by giving medicine that makes the heart beat faster. TREATMENT   Treatment depends on what may be causing your chest pain. Treatment may include:  Acid blockers for  heartburn.  Anti-inflammatory medicine.  Pain medicine for inflammatory conditions.  Antibiotics if an infection is present.  You may be advised to change lifestyle habits. This includes stopping smoking and avoiding alcohol, caffeine, and chocolate.  You may be advised to keep your head raised (elevated) when sleeping. This reduces the chance of acid going backward from your stomach into your esophagus. Most of the time, nonspecific chest pain will improve within 2-3 days with rest and mild pain medicine.  HOME CARE INSTRUCTIONS   If antibiotics were prescribed, take them as directed. Finish them even if you start to feel better.  For the next few days, avoid physical activities that bring on chest pain. Continue physical activities as directed.  Do not use any tobacco products, including cigarettes, chewing tobacco, or electronic cigarettes.  Avoid drinking alcohol.  Only take medicine as directed by your health care provider.  Follow your health care provider's suggestions for further testing if your chest pain does not go away.  Keep any follow-up appointments you made. If you do not go to an appointment, you could develop lasting (chronic) problems with pain. If there is any problem keeping an appointment, call to reschedule. SEEK MEDICAL CARE IF:   Your chest pain does not go away, even after treatment.  You have a rash with blisters on your chest.  You have a fever. SEEK IMMEDIATE MEDICAL CARE IF:   You have increased chest pain or pain that spreads to your arm, neck, jaw, back, or abdomen.  You have shortness of breath.  You have an increasing cough, or you cough  up blood.  You have severe back or abdominal pain.  You feel nauseous or vomit.  You have severe weakness.  You faint.  You have chills. This is an emergency. Do not wait to see if the pain will go away. Get medical help at once. Call your local emergency services (911 in [place] Do not drive  yourself to the hospital. MAKE SURE YOU:   Understand these instructions.  Will watch your condition.  Will get help right away if you are not doing well or get worse. Document Released: 05/14/2005 Document Revised: 08/09/2013 Document Reviewed: 03/09/2008 [facility] Patient Information 2015 [place], [place]. This information is not intended to replace advice given to you by your health care provider. Make sure you discuss any questions you have with your health care provider.  Panic Attacks Panic attacks are sudden, short-livedsurges of severe anxiety, fear, or discomfort. They may occur for no reason when you are relaxed, when you are anxious, or when you are sleeping. Panic attacks may occur for a number of reasons:   Healthy people occasionally have panic attacks in extreme, life-threatening situations, such as war or natural disasters. Normal anxiety is a protective mechanism of the body that helps [place] react to danger (fight or flight response).  Panic attacks are often seen with anxiety disorders, such as panic disorder, social anxiety disorder, generalized anxiety disorder, and phobias. Anxiety disorders cause excessive or uncontrollable anxiety. They may interfere with your relationships or other life activities.  Panic attacks are sometimes seen with other mental illnesses, such as depression and posttraumatic stress disorder.  Certain medical conditions, prescription medicines, and drugs of abuse can cause panic attacks. SYMPTOMS  Panic attacks start suddenly, peak within 20 minutes, and are accompanied by four or more of the following symptoms:  Pounding heart or fast heart rate (palpitations).  Sweating.  Trembling or shaking.  Shortness of breath or feeling smothered.  Feeling choked.  Chest pain or discomfort.  Nausea or strange feeling in your stomach.  Dizziness, light-headedness, or feeling like you will faint.  Chills or hot flushes.  Numbness or tingling in  your lips or hands and feet.  Feeling that things are not real or feeling that you are not yourself.  Fear of losing control or going crazy.  Fear of dying. Some of these symptoms can mimic serious medical conditions. For example, you may think you are having a heart attack. Although panic attacks can be very scary, they are not life threatening. DIAGNOSIS  Panic attacks are diagnosed through an assessment by your health care provider. Your health care provider will ask questions about your symptoms, such as where and when they occurred. Your health care provider will also ask about your medical history and use of alcohol and drugs, including prescription medicines. Your health care provider may order blood tests or other studies to rule out a serious medical condition. Your health care provider may refer you to a mental health professional for further evaluation. TREATMENT   Most healthy people who have one or two panic attacks in an extreme, life-threatening situation will not require treatment.  The treatment for panic attacks associated with anxiety disorders or other mental illness typically involves counseling with a mental health professional, medicine, or a combination of both. Your health care provider will help determine what treatment is best for you.  Panic attacks due to physical illness usually go away with treatment of the illness. If prescription medicine is causing panic attacks, talk with your health care  provider about stopping the medicine, decreasing the dose, or substituting another medicine.  Panic attacks due to alcohol or drug abuse go away with abstinence. Some adults need professional help in order to stop drinking or using drugs. HOME CARE INSTRUCTIONS   Take all medicines as directed by your health care provider.   Schedule and attend follow-up visits as directed by your health care provider. It is important to keep all your appointments. SEEK MEDICAL CARE  IF:  You are not able to take your medicines as prescribed.  Your symptoms do not improve or get worse. SEEK IMMEDIATE MEDICAL CARE IF:   You experience panic attack symptoms that are different than your usual symptoms.  You have serious thoughts about hurting yourself or others.  You are taking medicine for panic attacks and have a serious side effect. MAKE SURE YOU:  Understand these instructions.  Will watch your condition.  Will get help right away if you are not doing well or get worse. Document Released: 08/04/2005 Document Revised: 08/09/2013 Document Reviewed: 03/18/2013 [facility] Patient Information 2015 [place], [place]. This information is not intended to replace advice given to you by your health care provider. Make sure you discuss any questions you have with your health care provider.

## 2014-11-20 ENCOUNTER — Encounter: Payer: Self-pay | Admitting: Family Medicine

## 2015-01-10 ENCOUNTER — Ambulatory Visit (INDEPENDENT_AMBULATORY_CARE_PROVIDER_SITE_OTHER): Payer: Medicare Other | Admitting: Family Medicine

## 2015-01-10 ENCOUNTER — Encounter: Payer: Self-pay | Admitting: Family Medicine

## 2015-01-10 VITALS — BP 108/50 | HR 79 | Temp 98.7°F | Resp 16 | Ht 62.75 in | Wt 148.0 lb

## 2015-01-10 DIAGNOSIS — H6502 Acute serous otitis media, left ear: Secondary | ICD-10-CM

## 2015-01-10 DIAGNOSIS — K0889 Other specified disorders of teeth and supporting structures: Secondary | ICD-10-CM

## 2015-01-10 DIAGNOSIS — K088 Other specified disorders of teeth and supporting structures: Secondary | ICD-10-CM | POA: Diagnosis not present

## 2015-01-10 MED ORDER — CEFDINIR 300 MG PO CAPS: 600.0000 mg | ORAL_CAPSULE | Freq: Every day | ORAL | Status: DC

## 2015-01-10 NOTE — Patient Instructions (Signed)
Follow up with your dentist as planned.

## 2015-01-10 NOTE — Progress Notes (Signed)
° °  Subjective:    Patient ID: Carol Tucker, female    DOB: 1941/04/25, 74 y.o.   MRN: [id number]  This chart was scribed for Robyn Haber, MD, by Stephania Fragmin, ED Scribe. This patient was seen in room 4 and the patient's care was started at 12:37 PM.    HPI  HPI Comments: Carol Tucker is a 74 y.o. female who presents to the [facility] complaining of nausea, ear congestion, and a cough. She also complains of associated loss of appetite. Patient was at the [facility] when a dental crown came loose - she was seen by a dentist, who removed it, and prescribed amoxicillin. She then gradually developed nausea over the next several days. She adds that she has also has been having a URI and complains of ear congestion in her left ear. Patient is scheduled to have a root canal done in 7 days. Patient has known allergies to Reglan.  Patient is a retired Pharmacist, hospital.  Review of Systems  Constitutional: Positive for appetite change.  HENT: Positive for dental problem and ear pain (feels like a pressure).   Gastrointestinal: Positive for nausea.       Objective:   Physical Exam  Constitutional: She is oriented to person, place, and time. She appears well-developed and well-nourished. No distress.  HENT:  Head: Normocephalic and atraumatic.  Left Ear: Tympanic membrane is bulging.  Tooth #5 is fractured. Mild bulging of left TM.   Eyes: Conjunctivae and EOM are normal.  Neck: Neck supple. No tracheal deviation present.  Cardiovascular: Normal rate.   Pulmonary/Chest: Effort normal. No respiratory distress.  Musculoskeletal: Normal range of motion.  Neurological: She is alert and oriented to person, place, and time.  Skin: Skin is warm and dry.  Psychiatric: She has a normal mood and affect. Her behavior is normal.  Nursing note and vitals reviewed.     Assessment & Plan:   This chart was scribed in my presence and reviewed by me personally.    ICD-9-CM  ICD-10-CM   1. Acute serous otitis media of left ear, recurrence not specified 381.01 H65.02 cefdinir (OMNICEF) 300 MG capsule  2. Pain, dental 525.9 K08.8 cefdinir (OMNICEF) 300 MG capsule     Signed, Robyn Haber, MD

## 2015-01-26 ENCOUNTER — Encounter: Payer: Self-pay | Admitting: Family Medicine

## 2015-01-26 ENCOUNTER — Ambulatory Visit (INDEPENDENT_AMBULATORY_CARE_PROVIDER_SITE_OTHER): Payer: Medicare Other | Admitting: Family Medicine

## 2015-01-26 VITALS — BP 132/63 | HR 65 | Temp 98.2°F | Resp 16 | Ht 63.25 in | Wt 146.4 lb

## 2015-01-26 DIAGNOSIS — M8580 Other specified disorders of bone density and structure, unspecified site: Secondary | ICD-10-CM

## 2015-01-26 DIAGNOSIS — F410 Panic disorder [episodic paroxysmal anxiety] without agoraphobia: Secondary | ICD-10-CM | POA: Insufficient documentation

## 2015-01-26 DIAGNOSIS — E785 Hyperlipidemia, unspecified: Secondary | ICD-10-CM | POA: Diagnosis not present

## 2015-01-26 DIAGNOSIS — G5601 Carpal tunnel syndrome, right upper limb: Secondary | ICD-10-CM | POA: Insufficient documentation

## 2015-01-26 DIAGNOSIS — K219 Gastro-esophageal reflux disease without esophagitis: Secondary | ICD-10-CM | POA: Diagnosis not present

## 2015-01-26 DIAGNOSIS — R3129 Other microscopic hematuria: Secondary | ICD-10-CM | POA: Insufficient documentation

## 2015-01-26 DIAGNOSIS — Z79899 Other long term (current) drug therapy: Secondary | ICD-10-CM | POA: Diagnosis not present

## 2015-01-26 DIAGNOSIS — I10 Essential (primary) hypertension: Secondary | ICD-10-CM

## 2015-01-26 DIAGNOSIS — R312 Other microscopic hematuria: Secondary | ICD-10-CM

## 2015-01-26 LAB — COMPREHENSIVE METABOLIC PANEL
ALBUMIN: 4.5 g/dL (ref 3.5–5.2)
ALT: 11 U/L (ref 0–35)
AST: 14 U/L (ref 0–37)
Alkaline Phosphatase: 46 U/L (ref 39–117)
BUN: 15 mg/dL (ref 6–23)
CO2: 24 mEq/L (ref 19–32)
Calcium: 9.1 mg/dL (ref 8.4–10.5)
Chloride: 105 mEq/L (ref 96–112)
Creat: 0.92 mg/dL (ref 0.50–1.10)
GLUCOSE: 89 mg/dL (ref 70–99)
POTASSIUM: 4.1 meq/L (ref 3.5–5.3)
Sodium: 139 mEq/L (ref 135–145)
Total Bilirubin: 0.5 mg/dL (ref 0.2–1.2)
Total Protein: 7.2 g/dL (ref 6.0–8.3)

## 2015-01-26 LAB — LIPID PANEL
Cholesterol: 173 mg/dL (ref 0–200)
HDL: 42 mg/dL — ABNORMAL LOW (ref >=46)
LDL CALC: 106 mg/dL — AB (ref 0–99)
Total CHOL/HDL Ratio: 4.1 Ratio
Triglycerides: 127 mg/dL (ref <150)
VLDL: 25 mg/dL (ref 0–40)

## 2015-01-26 MED ORDER — FAMOTIDINE 20 MG PO TABS: 20.0000 mg | ORAL_TABLET | Freq: Two times a day (BID) | ORAL | Status: DC

## 2015-01-26 NOTE — Progress Notes (Deleted)
Subjective:   This chart was scribed for Carol Cheadle, MD by Moises Blood, Medical Scribe. This patient was seen in Room 21 and the patient's care was started 8:19 AM.    Patient ID: Carol Tucker, female    DOB: 12/08/1940, 74 y.o.   MRN: [id number]  HPI Chief Complaint  Patient presents with   Follow-up    cholesterol and blood pressure   Carol Tucker is a 74 y.o. female who presents to [facility] for follow up. Pt notes no changes in urine. She went to see gynocologist and had micro blood in urine. She denies having a urologist. She denies chest pains. She also notes only having 4 panic attacks in 3 years. She denies problems with medication. She talked about her carpal tunnel being not really bad, only sometimes when she sleeps on it at night. She takes vitamin D, fish oil and calcium BID.      Past Medical History  Diagnosis Date   Hypertension    Hyperlipidemia    GERD (gastroesophageal reflux disease)    Osteopenia 8-04    DEXA at [facility] 04/14/14 with lowest Tscore -1.7 at Left femoral neck. FRAX does not indicate need for bisphosphonate but density has sig worsened since prior dexa on 12/17/2009   Allergy    Cancer    Depression    Anxiety    Prediabetes     Current Outpatient Prescriptions on File Prior to Visit  Medication Sig Dispense Refill   ALPRAZolam (NIRAVAM) 0.5 MG dissolvable tablet Take 1 tablet (0.5 mg total) by mouth 3 (three) times daily as needed for anxiety. 20 tablet 0   aspirin EC 81 MG tablet Take 81 mg by mouth daily.     calcium carbonate (OS-CAL) 600 MG TABS tablet Take 600 mg by mouth 2 (two) times daily with a meal.     cholecalciferol (VITAMIN D) 1000 UNITS tablet Take 1,000 Units by mouth 2 (two) times daily.     famotidine (PEPCID) 20 MG tablet TAKE 1 TABLET BY MOUTH TWICE A DAY 180 tablet 2   lisinopril-hydrochlorothiazide (PRINZIDE,ZESTORETIC) 10-12.5 MG per tablet Take 1 tablet by mouth daily. 90 tablet 3   pravastatin  (PRAVACHOL) 40 MG tablet Take 1 tablet (40 mg total) by mouth daily. 90 tablet 1   No current facility-administered medications on file prior to visit.   Allergies  Allergen Reactions   Metoclopramide Hcl     REACTION: tremors, clinched jaws     REGLAN       Review of Systems  Genitourinary: Negative for dysuria and frequency.       Objective:   Physical Exam  Constitutional: She is oriented to person, place, and time. She appears well-developed and well-nourished. No distress.  HENT:  Head: Normocephalic and atraumatic.  Eyes: EOM are normal. Pupils are equal, round, and reactive to light.  Neck: Neck supple. No thyromegaly present.  Cardiovascular: Normal rate and normal heart sounds.   Pulmonary/Chest: Effort normal and breath sounds normal. No respiratory distress.  Musculoskeletal: Normal range of motion.  Neurological: She is alert and oriented to person, place, and time.  Skin: Skin is warm and dry.  Psychiatric: She has a normal mood and affect. Her behavior is normal.  Nursing note and vitals reviewed.  BP 132/63 mmHg   Pulse 65   Temp(Src) 98.2 F (36.8 C) (Oral)   Resp 16   Ht 5' 3.25" (1.607 m)   Wt 146 lb 6.4 oz (66.407 kg)  BMI 25.71 kg/m2   SpO2 96%        Assessment & Plan:  Refill pravastatin after labs return.  1. Essential hypertension   2. Gastroesophageal reflux disease, esophagitis presence not specified   3. Osteopenia   4. Hyperlipidemia   5. Polypharmacy   6. Carpal tunnel syndrome of right wrist   7. Panic attack   8. Microscopic hematuria     Orders Placed This Encounter  Procedures   Lipid panel    Order Specific Question:  Has the patient fasted?    Answer:  Yes   Comprehensive metabolic panel    Order Specific Question:  Has the patient fasted?    Answer:  Yes    Meds ordered this encounter  Medications   DISCONTD: famotidine (PEPCID) 20 MG tablet    Sig: Take 1 tablet (20 mg total) by mouth 2 (two) times daily.     Dispense:  180 tablet    Refill:  3   famotidine (PEPCID) 20 MG tablet    Sig: Take 1 tablet (20 mg total) by mouth 2 (two) times daily.    Dispense:  180 tablet    Refill:  3    I personally performed the services described in this documentation, which was scribed in my presence. The recorded information has been reviewed and considered, and addended by me as needed.  Carol Cheadle, MD MPH

## 2015-01-29 MED ORDER — FAMOTIDINE 20 MG PO TABS: 20.0000 mg | ORAL_TABLET | Freq: Two times a day (BID) | ORAL | Status: DC

## 2015-01-31 MED ORDER — PRAVASTATIN SODIUM 40 MG PO TABS: 40.0000 mg | ORAL_TABLET | Freq: Every day | ORAL | Status: DC

## 2015-01-31 NOTE — Progress Notes (Signed)
Subjective:   This chart was scribed for Delman Cheadle, MD by Moises Blood, Medical Scribe. This patient was seen in Room 21 and the patient's care was started 8:19 AM.    Patient ID: Carol Tucker, female    DOB: Jun 13, 1941, 74 y.o.   MRN: [id number]  HPI Chief Complaint  Patient presents with  . Follow-up    cholesterol and blood pressure   Carol Tucker is a 74 y.o. female who presents to [facility] for follow up. Pt notes no changes in urine. She went to see gynocologist and had micro blood in urine. She denies having a urologist. She denies chest pains. She also notes only having 4 panic attacks in 3 years. She denies problems with medication. She talked about her carpal tunnel being not really bad, only sometimes when she sleeps on it at night. She takes vitamin D, fish oil and calcium BID.      Past Medical History  Diagnosis Date  . Hypertension   . Hyperlipidemia   . GERD (gastroesophageal reflux disease)   . Osteopenia 8-04    DEXA at [facility] 04/14/14 with lowest Tscore -1.7 at Left femoral neck. FRAX does not indicate need for bisphosphonate but density has sig worsened since prior dexa on 12/17/2009  . Allergy   . Cancer   . Depression   . Anxiety   . Prediabetes     Current Outpatient Prescriptions on File Prior to Visit  Medication Sig Dispense Refill  . ALPRAZolam (NIRAVAM) 0.5 MG dissolvable tablet Take 1 tablet (0.5 mg total) by mouth 3 (three) times daily as needed for anxiety. 20 tablet 0  . aspirin EC 81 MG tablet Take 81 mg by mouth daily.    . calcium carbonate (OS-CAL) 600 MG TABS tablet Take 600 mg by mouth 2 (two) times daily with a meal.    . cholecalciferol (VITAMIN D) 1000 UNITS tablet Take 1,000 Units by mouth 2 (two) times daily.    Marland Kitchen lisinopril-hydrochlorothiazide (PRINZIDE,ZESTORETIC) 10-12.5 MG per tablet Take 1 tablet by mouth daily. 90 tablet 3   No current facility-administered medications on file prior to visit.   Allergies  Allergen  Reactions  . Metoclopramide Hcl     REACTION: tremors, clinched jaws     REGLAN       Review of Systems  Constitutional: Negative for fever, chills, diaphoresis, activity change, appetite change and unexpected weight change.  Eyes: Negative for visual disturbance.  Respiratory: Negative for cough and shortness of breath.   Cardiovascular: Negative for chest pain, palpitations and leg swelling.  Genitourinary: Negative for dysuria, frequency and decreased urine volume.  Neurological: Negative for syncope and headaches.  Hematological: Does not bruise/bleed easily.       Objective:   Physical Exam  Constitutional: She is oriented to person, place, and time. She appears well-developed and well-nourished. No distress.  HENT:  Head: Normocephalic and atraumatic.  Eyes: EOM are normal. Pupils are equal, round, and reactive to light.  Neck: Neck supple. No thyromegaly present.  Cardiovascular: Normal rate and normal heart sounds.   Pulmonary/Chest: Effort normal and breath sounds normal. No respiratory distress.  Musculoskeletal: Normal range of motion.  Neurological: She is alert and oriented to person, place, and time.  Skin: Skin is warm and dry.  Psychiatric: She has a normal mood and affect. Her behavior is normal.  Nursing note and vitals reviewed.  BP 132/63 mmHg  Pulse 65  Temp(Src) 98.2 F (36.8 C) (Oral)  Resp 16  Ht 5' 3.25" (1.607 m)  Wt 146 lb 6.4 oz (66.407 kg)  BMI 25.71 kg/m2  SpO2 96%        Assessment & Plan:   1. Essential hypertension - very well controlled on current lisinopril-hctz  2. Gastroesophageal reflux disease, esophagitis presence not specified   3. Osteopenia   4. Hyperlipidemia - started pravastatin 6 mos ago - no side effects. LDL improved by 65 mg/dl but only improved ASCVD risk from 17.5% -> 16.5% - will defer to pt whether she wants to cont - luckily is tolerating well - fasting glucose improved, lfts nml  5. Polypharmacy   6. Carpal  tunnel syndrome of right wrist - has brace to wear prn  7. Panic attack - only 1 every few yrs - no further problems  8. Microscopic hematuria - pt reports extensive h/o intermittent microscopic hematuria throughout life followed by gyn - has never needed urology eval - declines recheck today but we will cont to monitor clinically and refer to urology if worsens or sxs.    Orders Placed This Encounter  Procedures  . Lipid panel    Order Specific Question:  Has the patient fasted?    Answer:  Yes  . Comprehensive metabolic panel    Order Specific Question:  Has the patient fasted?    Answer:  Yes    Meds ordered this encounter  Medications  . DISCONTD: famotidine (PEPCID) 20 MG tablet    Sig: Take 1 tablet (20 mg total) by mouth 2 (two) times daily.    Dispense:  180 tablet    Refill:  3  . famotidine (PEPCID) 20 MG tablet    Sig: Take 1 tablet (20 mg total) by mouth 2 (two) times daily.    Dispense:  180 tablet    Refill:  3  . pravastatin (PRAVACHOL) 40 MG tablet    Sig: Take 1 tablet (40 mg total) by mouth daily.    Dispense:  90 tablet    Refill:  3    I personally performed the services described in this documentation, which was scribed in my presence. The recorded information has been reviewed and considered, and addended by me as needed.  Delman Cheadle, MD MPH

## 2015-02-07 ENCOUNTER — Encounter: Payer: Self-pay | Admitting: Family Medicine

## 2015-02-12 ENCOUNTER — Other Ambulatory Visit: Payer: Self-pay

## 2015-04-27 ENCOUNTER — Encounter: Payer: Self-pay | Admitting: Internal Medicine

## 2015-05-11 ENCOUNTER — Encounter: Payer: Self-pay | Admitting: Family Medicine

## 2015-06-20 NOTE — Progress Notes (Signed)
Patient ID: Carol Tucker, female   DOB: [birth year], 74 y.o.   MRN: [id number] Subjective:    Carol Tucker is a 74 y.o. female who presents for Medicare Annual/Subsequent preventive examination.  Last CPE 3 yrs prior.  Preventive Screening-Counseling & Management  Tobacco History  Smoking status  . Former Smoker  Smokeless tobacco  . Never Used     Problems Prior to Visit 1. HTN: Controlled on lisinopril-hctz 10-12.5 - checks BP at home -  2.  Osteopenia: On Ca/Vit D supp, weight bearing exercise?  3. HPL: on pravastatin 40 started 1 yr prior with good response but due to other risk factors her 10 yr ASCVD risk only decreased from 17.5 -> 16.4% despite decrease in LDL by 65 on pravastatin so may not have much clinical benefit for pt so ok to d/c statin which she did but Dr. 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 Pt tol pravastatin well so is going to restart. 4.  Pre-DM borderline - a1c 5.7 7 mos prior 5.  Microscopic hematuria- was intermittent for years, never saw urology, no change in urine. nml uric acid and tsh 3 yrs prior 6.  Cataracts - followed by Dr. Katy Fitch - needs surg so giong to do next year 7.  Left lower varicose veins - going to have surg [facility] 8.   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 Microscopic hematuria 01/26/2015  . Polypharmacy 01/26/2015  . Carpal tunnel syndrome of right wrist 01/26/2015  . Panic attack 01/26/2015  . Migraine 04/05/2012  . Hyperlipidemia   . GERD (gastroesophageal reflux disease)   . Osteopenia   . HTN (hypertension) 11/04/2011    Medications Prior to Visit Current Outpatient Prescriptions on File Prior to Visit  Medication Sig Dispense Refill  . ALPRAZolam (NIRAVAM) 0.5 MG dissolvable tablet Take 1 tablet  (0.5 mg total) by mouth 3 (three) times daily as needed for anxiety. 20 tablet 0  . aspirin EC 81 MG tablet Take 81 mg by mouth daily.    . calcium carbonate (OS-CAL) 600 MG TABS tablet Take 600 mg by mouth 2 (two) times daily with a meal.    . cholecalciferol (VITAMIN D) 1000 UNITS tablet Take 1,000 Units by mouth 2 (two) times daily.    . famotidine (PEPCID) 20 MG tablet Take 1 tablet (20 mg total) by mouth 2 (two) times daily. 180 tablet 3  . lisinopril-hydrochlorothiazide (PRINZIDE,ZESTORETIC) 10-12.5 MG per tablet Take 1 tablet by mouth daily. 90 tablet 3  . pravastatin (PRAVACHOL) 40 MG tablet Take 1 tablet (40 mg total) by mouth daily. 90 tablet 3   No current facility-administered medications on file prior to visit.    Current Medications (verified) Current Outpatient Prescriptions  Medication Sig Dispense Refill  . ALPRAZolam (NIRAVAM) 0.5 MG dissolvable tablet Take 1 tablet (0.5 mg total) by mouth 3 (three) times daily as needed for anxiety. 20 tablet 0  . aspirin EC 81 MG tablet Take 81 mg by mouth daily.    . calcium carbonate (OS-CAL) 600 MG TABS tablet Take 600 mg by mouth 2 (two) times daily with a meal.    . cholecalciferol (VITAMIN D) 1000 UNITS tablet Take 1,000 Units by mouth 2 (two) times daily.    . famotidine (PEPCID) 20 MG  tablet Take 1 tablet (20 mg total) by mouth 2 (two) times daily. 180 tablet 3  . lisinopril-hydrochlorothiazide (PRINZIDE,ZESTORETIC) 10-12.5 MG per tablet Take 1 tablet by mouth daily. 90 tablet 3  . pravastatin (PRAVACHOL) 40 MG tablet Take 1 tablet (40 mg total) by mouth daily. 90 tablet 3   No current facility-administered medications for this visit.     Allergies (verified) Metoclopramide hcl   PAST HISTORY  Family History Family History  Problem Relation Age of Onset  . Hypertension Father   . Heart disease Father   . Cancer Father     BLADDER & LUNG  . Heart disease Paternal Grandmother   . Alzheimer's disease Mother   . Heart  disease Brother   . Heart disease Paternal Grandfather   . Diabetes Paternal Grandfather   . Colon cancer Neg Hx   . Pancreatic cancer Neg Hx   . Stomach cancer Neg Hx     Social History Social History  Substance Use Topics  . Smoking status: Former Research scientist (life sciences)  . Smokeless tobacco: Never Used  . Alcohol Use: Yes     Comment: Rare     Are there smokers in your home (other than you)? No  Risk Factors Current exercise habits: Home exercise routine includes walking 1 1/2 hrs per week. She does some wall push-ups and leg strengthening exercises Dietary issues discussed: trying to eat healthy - would like to lose 5 lbs  Cardiac risk factors: advanced age (older than 81 for men, 75 for women), dyslipidemia and hypertension.  Depression Screen Depression screen [facility] 2/9 06/21/2015 01/26/2015 01/10/2015 07/28/2014 01/20/2014  Decreased Interest 0 0 0 0 0  Down, Depressed, Hopeless 0 0 0 0 0  PHQ - 2 Score 0 0 0 0 0     (Note: if answer to either of the following is "Yes", a more complete depression screening is indicated)   Over the past two weeks, have you felt down, depressed or hopeless? No  Over the past two weeks, have you felt little interest or pleasure in doing things? No  Have you lost interest or pleasure in daily life? No  Do you often feel hopeless? No  Do you cry easily over simple problems? No  Activities of Daily Living In your present state of health, do you have any difficulty performing the following activities?:  Driving? No Managing money?  No Feeding yourself? No Getting from bed to chair? No Climbing a flight of stairs? No  - careful going down stairs Preparing food and eating?: No Bathing or showering? No - Has a walk-in shower with a bar to hold onto Getting dressed: No Getting to the toilet? No Using the toilet:No Moving around from place to place: No In the past year have you fallen or had a near fall?:No   Are you sexually active?  No  Do you have more  than one partner?  No  Hearing Difficulties: No Do you often ask people to speak up or repeat themselves? No Do you experience ringing or noises in your ears? Yes - has had for several years and is not changing, no usually bothersome to her Do you have difficulty understanding soft or whispered voices? No   Do you feel that you have a problem with memory? No  Do you often misplace items? No  Do you feel safe at home?  Yes  Cognitive Testing  Alert? Yes  Normal Appearance?Yes  Oriented to person? Yes  Place? Yes   Time?  Yes  Recall of three objects?  Yes  Can perform simple calculations? Yes  Displays appropriate judgment?Yes  Can read the correct time from a watch face?Yes   Advanced Directives have been discussed with the patient? Yes -   List the Names of Other Physician/Practitioners you currently use: 1.  GI - Dr. Joanell Rising  2.  Dentist - Dr. Trey Paula 3.  Dermatologist - Dr. Fontaine No 4. Prior gynecologist retired 60.  Neurologist - Dr. Erling Cruz (retired) 97..  Cardiologist - Dr. Acie Fredrickson 7.  Surgery - Dr. Ronnald Collum any recent Medical Services you may have received from other than [facility] providers in the past year (date may be approximate).  Immunization History  Administered Date(s) Administered  . Influenza,inj,Quad PF,36+ Mos 05/06/2013  . Influenza-Unspecified 05/18/2014  . Pneumococcal Polysaccharide-23 04/05/2012    Screening Tests Health Maintenance  Topic Date Due  . ZOSTAVAX  07/15/2002  . PNA vac Low Risk Adult (2 of 2 - PCV13) 04/05/2013  . INFLUENZA VACCINE  03/19/2015  . MAMMOGRAM  04/15/2017  . TETANUS/TDAP  06/01/2022  . COLONOSCOPY  06/28/2023  . DEXA SCAN  Completed    All answers were reviewed with the patient and necessary referrals were made:  Jocabed Cheese, MD   06/20/2015   History reviewed: allergies, current medications, past family history, past medical history, past social history, past surgical history and problem  list  Review of Systems A comprehensive review of systems was negative except for: Eyes: positive for visual disturbance, eye itching Ears, nose, mouth, throat, and face: positive for tinnitus Allergic/Immunologic: positive for hay fever    Objective:    Visual Acuity Screening   Right eye Left eye Both eyes  Without correction:     With correction: 20/50 20/50 20/50    BP 126/60 mmHg  Pulse 76  Temp(Src) 98.1 F (36.7 C) (Oral)  Resp 16  Ht 5\' 3"  (1.6 m)  Wt 148 lb 3.2 oz (67.223 kg)  BMI 26.26 kg/m2  SpO2 96%  General Appearance:    Alert, cooperative, no distress, appears stated age  Head:    Normocephalic, without obvious abnormality, atraumatic  Eyes:    PERRL, conjunctiva/corneas clear, EOM's intact, fundi    benign, both eyes  Ears:    Normal TM's and external ear canals, both ears  Nose:   Nares normal, septum midline, mucosa normal, no drainage    or sinus tenderness  Throat:   Lips, mucosa, and tongue normal; teeth and gums normal  Neck:   Supple, symmetrical, trachea midline, no adenopathy;    thyroid:  no enlargement/tenderness/nodules; no carotid   bruit or JVD  Back:     Symmetric, no curvature, ROM normal, no CVA tenderness  Lungs:     Clear to auscultation bilaterally, respirations unlabored  Chest Wall:    No tenderness or deformity   Heart:    Regular rate and rhythm, S1 and S2 normal, no murmur, rub   or gallop  Breast Exam:    No tenderness, masses, or nipple abnormality  Abdomen:     Soft, non-tender, bowel sounds active all four quadrants,    no masses, no organomegaly  Genitalia:    Normal female without lesion, discharge or tenderness  Rectal:    Normal tone, normal prostate, no masses or tenderness;   guaiac negative stool  Extremities:   Extremities normal, atraumatic, no cyanosis or edema  Pulses:   2+ and symmetric all extremities  Skin:   Skin color, texture, turgor  normal, no rashes or lesions  Lymph nodes:   Cervical, supraclavicular, and  axillary nodes normal  Neurologic:   CNII-XII intact, normal strength, sensation and reflexes    throughout       Assessment:     1. Medicare annual wellness visit, subsequent   2. Essential hypertension   3. Osteopenia   4. Microscopic hematuria   5. Hyperlipidemia   6. Polypharmacy   7. Need for zoster vaccination   8. Need for prophylactic vaccination against Streptococcus pneumoniae (pneumococcus)          Plan:  Ok to refill BP meds and pepcid x 1 yr whenever pharmacy request   During the course of the visit the patient was educated and counseled about appropriate screening and preventive services including:    Pneumococcal vaccine:  pneumovax-23 done 03/2012,  prevnar done today.  Influenza vaccine: done this year  Td vaccine: TDaP done 2013  Screening electrocardiogram  Diabetes screening  Pap smear: No h/o abnml, none further needed due to age  Advanced directives: has an advanced directive - a copy HAS NOT been provided.   Colorectal cancer screening:  Colonoscopy 06/2013 by Dr. Henrene Pastor - has h/o adenomatous colon polyps but most recent in 2014 was neg/normal so ok to repeat in 10 yrs if indicated at that time though will likely age out.  Zostavax done around 2014,  Bone densitometry screening: DEXA scan 2011 and 03/2014 at [facility] shows osteopenia with worst T-score at femur with -1.7.  Pt w/o sig risk factors but FRAX does give 11% any fracture and 2.1% hip fracture likelihood. Nml vit D level last yr at 80  Screening mammography: Mammogram UTD as done on 03/2015 and [facility] at [facility]  Does NOT need Hep C screen since born prior to 1945, no need for HIV for lifelong monogamy  Diet review for nutrition referral? Yes ____  Not Indicated _X___   Patient Instructions (the written p lan) was given to the patient.  Medicare Attestation I have personally reviewed: The patient's medical and social history Their use of alcohol, tobacco or illicit drugs Their  current medications and supplements The patient's functional ability including ADLs,fall risks, home safety risks, cognitive, and hearing and visual impairment Diet and physical activities Evidence for depression or mood disorders  The patient's weight, height, BMI, and visual acuity have been recorded in the chart.  I have made referrals, counseling, and provided education to the patient based on review of the above and I have provided the patient with a written personalized care plan for preventive services.     Delman Cheadle, MD   06/20/2015

## 2015-06-21 ENCOUNTER — Encounter: Payer: Self-pay | Admitting: Family Medicine

## 2015-06-21 ENCOUNTER — Ambulatory Visit (INDEPENDENT_AMBULATORY_CARE_PROVIDER_SITE_OTHER): Payer: Medicare Other | Admitting: Family Medicine

## 2015-06-21 VITALS — BP 126/60 | HR 76 | Temp 98.1°F | Resp 16 | Ht 63.0 in | Wt 148.2 lb

## 2015-06-21 DIAGNOSIS — E785 Hyperlipidemia, unspecified: Secondary | ICD-10-CM | POA: Diagnosis not present

## 2015-06-21 DIAGNOSIS — M8580 Other specified disorders of bone density and structure, unspecified site: Secondary | ICD-10-CM

## 2015-06-21 DIAGNOSIS — Z0000 Encounter for general adult medical examination without abnormal findings: Secondary | ICD-10-CM | POA: Diagnosis not present

## 2015-06-21 DIAGNOSIS — Z23 Encounter for immunization: Secondary | ICD-10-CM

## 2015-06-21 DIAGNOSIS — I10 Essential (primary) hypertension: Secondary | ICD-10-CM | POA: Diagnosis not present

## 2015-06-21 DIAGNOSIS — Z79899 Other long term (current) drug therapy: Secondary | ICD-10-CM

## 2015-06-21 DIAGNOSIS — R3129 Other microscopic hematuria: Secondary | ICD-10-CM

## 2015-06-21 LAB — LIPID PANEL
Cholesterol: 242 mg/dL — ABNORMAL HIGH (ref 125–200)
HDL: 42 mg/dL — ABNORMAL LOW (ref >=46)
LDL CALC: 157 mg/dL — AB (ref <130)
Total CHOL/HDL Ratio: 5.8 Ratio — ABNORMAL HIGH (ref <=5.0)
Triglycerides: 217 mg/dL — ABNORMAL HIGH (ref <150)
VLDL: 43 mg/dL — ABNORMAL HIGH (ref <30)

## 2015-06-21 LAB — COMPREHENSIVE METABOLIC PANEL
ALK PHOS: 56 U/L (ref 33–130)
ALT: 11 U/L (ref 6–29)
AST: 15 U/L (ref 10–35)
Albumin: 4.3 g/dL (ref 3.6–5.1)
BILIRUBIN TOTAL: 0.5 mg/dL (ref 0.2–1.2)
BUN: 15 mg/dL (ref 7–25)
CALCIUM: 9.2 mg/dL (ref 8.6–10.4)
CO2: 28 mmol/L (ref 20–31)
Chloride: 101 mmol/L (ref 98–110)
Creat: 0.89 mg/dL (ref 0.60–0.93)
Glucose, Bld: 108 mg/dL — ABNORMAL HIGH (ref 65–99)
POTASSIUM: 4.3 mmol/L (ref 3.5–5.3)
Sodium: 135 mmol/L (ref 135–146)
TOTAL PROTEIN: 7.4 g/dL (ref 6.1–8.1)

## 2015-06-21 LAB — POCT URINALYSIS DIP (MANUAL ENTRY)
BILIRUBIN UA: NEGATIVE
BILIRUBIN UA: NEGATIVE
GLUCOSE UA: NEGATIVE
Leukocytes, UA: NEGATIVE
Nitrite, UA: NEGATIVE
PH UA: 6.5
Protein Ur, POC: NEGATIVE
Spec Grav, UA: 1.01
Urobilinogen, UA: 0.2

## 2015-06-21 LAB — POC MICROSCOPIC URINALYSIS (UMFC): MUCUS RE: ABSENT

## 2015-06-21 MED ORDER — PRAVASTATIN SODIUM 40 MG PO TABS: 40.0000 mg | ORAL_TABLET | Freq: Every day | ORAL | Status: DC

## 2015-06-22 ENCOUNTER — Encounter: Payer: Medicare Other | Admitting: Family Medicine

## 2015-08-28 ENCOUNTER — Other Ambulatory Visit: Payer: Self-pay | Admitting: Family Medicine

## 2015-08-28 NOTE — Telephone Encounter (Signed)
Dr. Brigitte Pulse, [facility] to fill lisinopril until may?  He has an appointment last seen here 12/15

## 2015-10-27 ENCOUNTER — Ambulatory Visit (INDEPENDENT_AMBULATORY_CARE_PROVIDER_SITE_OTHER): Payer: Medicare Other | Admitting: Family Medicine

## 2015-10-27 VITALS — BP 120/66 | HR 69 | Temp 97.7°F | Resp 13 | Ht 63.0 in | Wt 151.0 lb

## 2015-10-27 DIAGNOSIS — B029 Zoster without complications: Secondary | ICD-10-CM

## 2015-10-27 MED ORDER — VALACYCLOVIR HCL 1 G PO TABS: 1000.0000 mg | ORAL_TABLET | Freq: Two times a day (BID) | ORAL | Status: DC

## 2015-10-27 MED ORDER — ACETAMINOPHEN-CODEINE #3 300-30 MG PO TABS: 1.0000 | ORAL_TABLET | ORAL | Status: DC | PRN

## 2015-10-27 MED ORDER — VALACYCLOVIR HCL 1 G PO TABS: 1000.0000 mg | ORAL_TABLET | Freq: Three times a day (TID) | ORAL | Status: DC

## 2015-10-27 NOTE — Progress Notes (Signed)
Subjective:  By signing my name below, I, Carol Tucker, attest that this documentation has been prepared under the direction and in the presence of Delman Cheadle, MD.  Electronically Signed: Thea Alken, ED Scribe. 10/27/2015. 12:40 PM.   Patient ID: Carol Tucker, female    DOB: [birth year], 75 y.o.   MRN: [id number]  HPI Chief Complaint  Patient presents with  . OTHER    shingles on upper back 2 days    HPI Comments: Carol Tucker is a 75 y.o. female who presents to the [facility] complaining of a rash along upper back noticed 2 days ago. Pt initially noticed sensitivity neck and upper back which has become tender and somewhat painful. She denies irritation when sleeping at night but does have irritation first thing in the morning She received shingles vaccines 3 years ago.  Patient Active Problem List   Diagnosis Date Noted  . Microscopic hematuria 01/26/2015  . Polypharmacy 01/26/2015  . Carpal tunnel syndrome of right wrist 01/26/2015  . Panic attack 01/26/2015  . Migraine 04/05/2012  . Hyperlipidemia   . GERD (gastroesophageal reflux disease)   . Osteopenia   . HTN (hypertension) 11/04/2011   Past Medical History  Diagnosis Date  . Hypertension   . Hyperlipidemia   . GERD (gastroesophageal reflux disease)   . Osteopenia 8-04    DEXA at [facility] 04/14/14 with lowest Tscore -1.7 at Left femoral neck. FRAX does not indicate need for bisphosphonate but density has sig worsened since prior dexa on 12/17/2009  . Allergy   . Cancer ([place])   . Depression   . Anxiety   . Prediabetes    Past Surgical History  Procedure Laterality Date  . Appendectomy    . Tubal ligation  1980  . Knee arthroscopy  2007  . 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 Dilation and curettage of uterus  1969    MISSED AB  . Tumor removal salivary gland     Allergies  Allergen Reactions  . Metoclopramide Hcl     REACTION: tremors, clinched jaws     REGLAN   Prior to Admission  medications   Medication Sig Start Date End Date Taking? Authorizing Provider  aspirin EC 81 MG tablet Take 81 mg by mouth daily.   Yes Historical Provider, MD  calcium carbonate (OS-CAL) 600 MG TABS tablet Take 600 mg by mouth 2 (two) times daily with a meal.   Yes Historical Provider, MD  cholecalciferol (VITAMIN D) 1000 UNITS tablet Take 1,000 Units by mouth 2 (two) times daily.   Yes Historical Provider, MD  famotidine (PEPCID) 20 MG tablet Take 1 tablet (20 mg total) by mouth 2 (two) times daily. 01/29/15  Yes Shawnee Knapp, MD  lisinopril-hydrochlorothiazide (PRINZIDE,ZESTORETIC) 10-12.5 MG tablet TAKE 1 TABLET BY MOUTH DAILY. 08/28/15  Yes Shawnee Knapp, MD  Multiple Vitamins-Minerals (VISION-VITE PRESERVE PO) Take by mouth.   Yes Historical Provider, MD  pravastatin (PRAVACHOL) 40 MG tablet Take 1 tablet (40 mg total) by mouth daily. 06/21/15  Yes Shawnee Knapp, MD   Social History   Social History  . Marital Status: Divorced    Spouse Name: N/A  . Number of Children: N/A  . Years of Education: N/A   Occupational History  . Not on file.   Social History Main Topics  . Smoking status: Former Research scientist (life sciences)  . Smokeless tobacco: Never Used  . Alcohol Use: Yes  Comment: Rare  . Drug Use: No  . Sexual Activity: No   Other Topics Concern  . Not on file   Social History Narrative   Review of Systems  Constitutional: Negative for fever and chills.  HENT: Negative for ear discharge, ear pain, facial swelling and hearing loss.   Eyes: Negative for pain, itching and visual disturbance.  Musculoskeletal: Positive for myalgias, back pain and neck pain. Negative for joint swelling, gait problem and neck stiffness.  Skin: Positive for color change and rash.  Allergic/Immunologic: Negative for immunocompromised state.  Neurological: Negative for dizziness, weakness, light-headedness, numbness and headaches.  Hematological: Negative for adenopathy.  Psychiatric/Behavioral: Negative for sleep  disturbance.   Objective:   Physical Exam  Constitutional: She is oriented to person, place, and time. She appears well-developed and well-nourished. No distress.  HENT:  Head: Normocephalic and atraumatic.  Eyes: Conjunctivae and EOM are normal.  Neck: Neck supple.  Cardiovascular: Normal rate.   Pulmonary/Chest: Effort normal.  Musculoskeletal: Normal range of motion.  Lymphadenopathy:    She has no cervical adenopathy.    She has no axillary adenopathy.       Right: No supraclavicular adenopathy present.  Neurological: She is alert and oriented to person, place, and time.  Skin: Skin is warm and dry.  T1-T2 a blanching macular areas of roundish erythema serpiginous of nature extending 1x3 inches with similar lesion at base of scalp in center extending over right posterior shoulder but does not cross mid clavicular line . Nothing along anterior chest.   Psychiatric: She has a normal mood and affect. Her behavior is normal.  Nursing note and vitals reviewed.     Filed Vitals:   10/27/15 1212  BP: 120/66  Pulse: 69  Temp: 97.7 F (36.5 C)  TempSrc: Oral  Resp: 13  Height: 5\' 3"  (1.6 m)  Weight: 151 lb (68.493 kg)  SpO2: 98%   Assessment & Plan:   1. Herpes zoster   very early stages, if does not progress as expected, rtc for further eval  Meds ordered this encounter  Medications  . DISCONTD: valACYclovir (VALTREX) 1000 MG tablet    Sig: Take 1 tablet (1,000 mg total) by mouth 2 (two) times daily.    Dispense:  21 tablet    Refill:  0  . acetaminophen-codeine (TYLENOL #3) 300-30 MG tablet    Sig: Take 1 tablet by mouth every 4 (four) hours as needed for moderate pain.    Dispense:  30 tablet    Refill:  0  . valACYclovir (VALTREX) 1000 MG tablet    Sig: Take 1 tablet (1,000 mg total) by mouth 3 (three) times daily.    Dispense:  21 tablet    Refill:  0    I personally performed the services described in this documentation, which was scribed in my presence. The  recorded information has been reviewed and considered, and addended by me as needed.  Delman Cheadle, MD MPH

## 2015-10-27 NOTE — Patient Instructions (Addendum)
IF you received an x-ray today, you will receive an invoice from [facility]. Please contact [facility] at [phone] with questions or concerns regarding your invoice.   IF you received labwork today, you will receive an invoice from [employer]. Please contact [facility] at [phone] with questions or concerns regarding your invoice.   Our billing staff will not be able to assist you with questions regarding bills from these companies.  You will be contacted with the lab results as soon as they are available. The fastest way to get your results is to activate your My Chart account. Instructions are located on the last page of this paperwork. If you have not heard from [place] regarding the results in 2 weeks, please contact this office.  Shingles Shingles, which is also known as herpes zoster, is an infection that causes a painful skin rash and fluid-filled blisters. Shingles is not related to genital herpes, which is a sexually transmitted infection.   Shingles only develops in people who:  Have had chickenpox.  Have received the chickenpox vaccine. (This is rare.) CAUSES Shingles is caused by varicella-zoster virus (VZV). This is the same virus that causes chickenpox. After exposure to VZV, the virus stays in the body in an inactive (dormant) state. Shingles develops if the virus reactivates. This can happen many years after the initial exposure to VZV. It is not known what causes this virus to reactivate. RISK FACTORS People who have had chickenpox or received the chickenpox vaccine are at risk for shingles. Infection is more common in people who:  Are older than age 59.  Have a weakened defense (immune) system, such as those with HIV, AIDS, or cancer.  Are taking medicines that weaken the immune system, such as transplant medicines.  Are under great stress. SYMPTOMS Early symptoms of this condition include itching, tingling, and pain in an  area on your skin. Pain may be described as burning, stabbing, or throbbing. A few days or weeks after symptoms start, a painful red rash appears, usually on one side of the body in a bandlike or beltlike pattern. The rash eventually turns into fluid-filled blisters that break open, scab over, and dry up in about 2-3 weeks. At any time during the infection, you may also develop:  A fever.  Chills.  A headache.  An upset stomach. DIAGNOSIS This condition is diagnosed with a skin exam. Sometimes, skin or fluid samples are taken from the blisters before a diagnosis is made. These samples are examined under a microscope or sent to a lab for testing. TREATMENT There is no specific cure for this condition. Your health care provider will probably prescribe medicines to help you manage pain, recover more quickly, and avoid long-term problems. Medicines may include:  Antiviral drugs.  Anti-inflammatory drugs.  Pain medicines. If the area involved is on your face, you may be referred to a specialist, such as an eye doctor (ophthalmologist) or an ear, nose, and throat (ENT) doctor to help you avoid eye problems, chronic pain, or disability. HOME CARE INSTRUCTIONS Medicines  Take medicines only as directed by your health care provider.  Apply an anti-itch or numbing cream to the affected area as directed by your health care provider. Blister and Rash Care  Take a cool bath or apply cool compresses to the area of the rash or blisters as directed by your health care provider. This may help with pain and itching.  Keep your rash covered with a loose bandage (dressing). Wear loose-fitting  clothing to help ease the pain of material rubbing against the rash.  Keep your rash and blisters clean with mild soap and cool water or as directed by your health care provider.  Check your rash every day for signs of infection. These include redness, swelling, and pain that lasts or increases.  Do not pick  your blisters.  Do not scratch your rash. General Instructions  Rest as directed by your health care provider.  Keep all follow-up visits as directed by your health care provider. This is important.  Until your blisters scab over, your infection can cause chickenpox in people who have never had it or been vaccinated against it. To prevent this from happening, avoid contact with other people, especially:  Babies.  Pregnant women.  Children who have eczema.  Elderly people who have transplants.  People who have chronic illnesses, such as leukemia or AIDS. SEEK MEDICAL CARE IF:  Your pain is not relieved with prescribed medicines.  Your pain does not get better after the rash heals.  Your rash looks infected. Signs of infection include redness, swelling, and pain that lasts or increases. SEEK IMMEDIATE MEDICAL CARE IF:  The rash is on your face or nose.  You have facial pain, pain around your eye area, or loss of feeling on one side of your face.  You have ear pain or you have ringing in your ear.  You have loss of taste.  Your condition gets worse.   This information is not intended to replace advice given to you by your health care provider. Make sure you discuss any questions you have with your health care provider.   Document Released: 08/04/2005 Document Revised: 08/25/2014 Document Reviewed: 06/15/2014 Elsevier Interactive Patient Education [employer].

## 2015-12-20 ENCOUNTER — Ambulatory Visit (INDEPENDENT_AMBULATORY_CARE_PROVIDER_SITE_OTHER): Payer: Medicare Other | Admitting: Family Medicine

## 2015-12-20 ENCOUNTER — Encounter: Payer: Self-pay | Admitting: Family Medicine

## 2015-12-20 VITALS — BP 110/54 | HR 64 | Temp 98.1°F | Resp 16 | Ht 63.0 in | Wt 148.4 lb

## 2015-12-20 DIAGNOSIS — Z5181 Encounter for therapeutic drug level monitoring: Secondary | ICD-10-CM

## 2015-12-20 DIAGNOSIS — R7303 Prediabetes: Secondary | ICD-10-CM | POA: Insufficient documentation

## 2015-12-20 DIAGNOSIS — I10 Essential (primary) hypertension: Secondary | ICD-10-CM | POA: Diagnosis not present

## 2015-12-20 DIAGNOSIS — M8580 Other specified disorders of bone density and structure, unspecified site: Secondary | ICD-10-CM

## 2015-12-20 DIAGNOSIS — E785 Hyperlipidemia, unspecified: Secondary | ICD-10-CM | POA: Diagnosis not present

## 2015-12-20 DIAGNOSIS — R21 Rash and other nonspecific skin eruption: Secondary | ICD-10-CM

## 2015-12-20 LAB — LIPID PANEL
CHOLESTEROL: 167 mg/dL (ref 125–200)
HDL: 41 mg/dL — AB (ref >=46)
LDL CALC: 90 mg/dL (ref <130)
TRIGLYCERIDES: 179 mg/dL — AB (ref <150)
Total CHOL/HDL Ratio: 4.1 Ratio (ref <=5.0)
VLDL: 36 mg/dL — ABNORMAL HIGH (ref <30)

## 2015-12-20 LAB — HEMOGLOBIN A1C
Hgb A1c MFr Bld: 6.1 % — ABNORMAL HIGH (ref <5.7)
Mean Plasma Glucose: 128 mg/dL

## 2015-12-20 LAB — COMPREHENSIVE METABOLIC PANEL
ALBUMIN: 4 g/dL (ref 3.6–5.1)
ALK PHOS: 46 U/L (ref 33–130)
ALT: 8 U/L (ref 6–29)
AST: 12 U/L (ref 10–35)
BILIRUBIN TOTAL: 0.5 mg/dL (ref 0.2–1.2)
BUN: 17 mg/dL (ref 7–25)
CALCIUM: 9.1 mg/dL (ref 8.6–10.4)
CO2: 24 mmol/L (ref 20–31)
Chloride: 103 mmol/L (ref 98–110)
Creat: 0.85 mg/dL (ref 0.60–0.93)
Glucose, Bld: 97 mg/dL (ref 65–99)
Potassium: 4.3 mmol/L (ref 3.5–5.3)
Sodium: 137 mmol/L (ref 135–146)
TOTAL PROTEIN: 6.9 g/dL (ref 6.1–8.1)

## 2015-12-20 LAB — POCT SKIN KOH: Skin KOH, POC: NEGATIVE

## 2015-12-20 MED ORDER — TRIAMCINOLONE ACETONIDE 0.025 % EX OINT: 1.0000 "application " | TOPICAL_OINTMENT | Freq: Two times a day (BID) | CUTANEOUS | Status: DC

## 2015-12-20 MED ORDER — FAMOTIDINE 20 MG PO TABS: 20.0000 mg | ORAL_TABLET | Freq: Two times a day (BID) | ORAL | Status: DC

## 2015-12-20 NOTE — Patient Instructions (Addendum)
   IF you received an x-ray today, you will receive an invoice from [facility]. Please contact [facility] at [phone] with questions or concerns regarding your invoice.   IF you received labwork today, you will receive an invoice from [facility]. Please contact [facility] at [phone] with questions or concerns regarding your invoice.   Our billing staff will not be able to assist you with questions regarding bills from these companies.  You will be contacted with the lab results as soon as they are available. The fastest way to get your results is to activate your My Chart account. Instructions are located on the last page of this paperwork. If you have not heard from us regarding the results in 2 weeks, please contact this office.     Contact Dermatitis Dermatitis is redness, soreness, and swelling (inflammation) of the skin. Contact dermatitis is a reaction to certain substances that touch the skin. There are two types of contact dermatitis:   Irritant contact dermatitis. This type is caused by something that irritates your skin, such as dry hands from washing them too much. This type does not require previous exposure to the substance for a reaction to occur. This type is more common.  Allergic contact dermatitis. This type is caused by a substance that you are allergic to, such as a nickel allergy or poison ivy. This type only occurs if you have been exposed to the substance (allergen) before. Upon a repeat exposure, your body reacts to the substance. This type is less common. CAUSES  Many different substances can cause contact dermatitis. Irritant contact dermatitis is most commonly caused by exposure to:   Makeup.   Soaps.   Detergents.   Bleaches.   Acids.   Metal salts, such as nickel.  Allergic contact dermatitis is most commonly caused by exposure to:   Poisonous plants.   Chemicals.   Jewelry.   Latex.    Medicines.   Preservatives in products, such as clothing.  RISK FACTORS This condition is more likely to develop in:   People who have jobs that expose them to irritants or allergens.  People who have certain medical conditions, such as asthma or eczema.  SYMPTOMS  Symptoms of this condition may occur anywhere on your body where the irritant has touched you or is touched by you. Symptoms include:  Dryness or flaking.   Redness.   Cracks.   Itching.   Pain or a burning feeling.   Blisters.  Drainage of small amounts of blood or clear fluid from skin cracks. With allergic contact dermatitis, there may also be swelling in areas such as the eyelids, mouth, or genitals.  DIAGNOSIS  This condition is diagnosed with a medical history and physical exam. A patch skin test may be performed to help determine the cause. If the condition is related to your job, you may need to see an occupational medicine specialist. TREATMENT Treatment for this condition includes figuring out what caused the reaction and protecting your skin from further contact. Treatment may also include:   Steroid creams or ointments. Oral steroid medicines may be needed in more severe cases.  Antibiotics or antibacterial ointments, if a skin infection is present.  Antihistamine lotion or an antihistamine taken by mouth to ease itching.  A bandage (dressing). HOME CARE INSTRUCTIONS Skin Care  Moisturize your skin as needed.   Apply cool compresses to the affected areas.  Try taking a bath with:  Epsom salts. Follow the instructions   on the packaging. You can get these at your local pharmacy or grocery store.  Baking soda. Pour a small amount into the bath as directed by your health care provider.  Colloidal oatmeal. Follow the instructions on the packaging. You can get this at your local pharmacy or grocery store.  Try applying baking soda paste to your skin. Stir water into baking soda until  it reaches a paste-like consistency.  Do not scratch your skin.  Bathe less frequently, such as every other day.  Bathe in lukewarm water. Avoid using hot water. Medicines  Take or apply over-the-counter and prescription medicines only as told by your health care provider.   If you were prescribed an antibiotic medicine, take or apply your antibiotic as told by your health care provider. Do not stop using the antibiotic even if your condition starts to improve. General Instructions  Keep all follow-up visits as told by your health care provider. This is important.  Avoid the substance that caused your reaction. If you do not know what caused it, keep a journal to try to track what caused it. Write down:  What you eat.  What cosmetic products you use.  What you drink.  What you wear in the affected area. This includes jewelry.  If you were given a dressing, take care of it as told by your health care provider. This includes when to change and remove it. SEEK MEDICAL CARE IF:   Your condition does not improve with treatment.  Your condition gets worse.  You have signs of infection such as swelling, tenderness, redness, soreness, or warmth in the affected area.  You have a fever.  You have new symptoms. SEEK IMMEDIATE MEDICAL CARE IF:   You have a severe headache, neck pain, or neck stiffness.  You vomit.  You feel very sleepy.  You notice red streaks coming from the affected area.  Your bone or joint underneath the affected area becomes painful after the skin has healed.  The affected area turns darker.  You have difficulty breathing.   This information is not intended to replace advice given to you by your health care provider. Make sure you discuss any questions you have with your health care provider.   Document Released: 08/01/2000 Document Revised: 04/25/2015 Document Reviewed: 12/20/2014 Elsevier Interactive Patient Education 2016 Elsevier Inc.  

## 2015-12-20 NOTE — Progress Notes (Signed)
Subjective:    Patient ID: Carol Tucker, female    DOB: 1941/02/07, 75 y.o.   MRN: [id number]  HPI 1. HTN: Controlled on lisinopril-hctz 10-12.5 - checks BP at home and has been running about the same. 2. Osteopenia: DEXA 03/2014 shows T score femur -1.7 at [facility]. Nml vit D 2015 45, On Ca/Vit D supp gummy, does get some weight bearing exercise - she is walking and home weight exercises, 3. HPL: on pravastatin 40 started 2015 yr prior with good response but due to other risk factors her 10 yr ASCVD risk only decreased from 17.5 -> 16.4% despite decrease in LDL by 65 on pravastatin so may not have much clinical benefit for pt so ok to d/c statin which she did but Dr. 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 Pt tol pravastatin well so restarted 6 mos prior. 4. Pre-DM borderline - a1c 5.7 7 mos prior 5. Microscopic hematuria- was intermittent for years, never saw urology, no change in urine. Last was nml 6. Cataracts - followed by Dr. Katy Fitch - needs surg so giong to do next year - sees annually around Oct 7. Left lower varicose veins - going to have surg at [facility] 8. Carpal Tunnel Syndrome - Rt>Lt, wearing night braces with partial relief - unchanged - Rt hand wakes her from sleep completely numb during night  Rash on back of neck for 1 wk- is in the same location of the shingles but is just itchy, no pain.  Has not seem to sig worsen - is relatively unchanged. Absolutely no new products - dies and cuts her own hair at home. Improves after shower with hot water and purell free shampoo.  Has used some top hydrocortisone and top acyclovir with temporarily relief of pruritis but no overall improvement.  Past Medical History  Diagnosis Date  . Hypertension   . Hyperlipidemia   . GERD (gastroesophageal reflux disease)   . Osteopenia 8-04    DEXA at [facility] 04/14/14 with lowest Tscore -1.7 at  Left femoral neck. FRAX does not indicate need for bisphosphonate but density has sig worsened since prior dexa on 12/17/2009  . Allergy   . Cancer ([place])   . Depression   . Anxiety   . Prediabetes    Past Surgical History  Procedure Laterality Date  . Appendectomy    . Tubal ligation  1980  . Knee arthroscopy  2007  . 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 Dilation and curettage of uterus  1969    MISSED AB  . Tumor removal salivary gland     Current Outpatient Prescriptions on File Prior to Visit  Medication Sig Dispense Refill  . aspirin EC 81 MG tablet Take 81 mg by mouth daily.    . cholecalciferol (VITAMIN D) 1000 UNITS tablet Take 1,000 Units by mouth 2 (two) times daily.    Marland Kitchen lisinopril-hydrochlorothiazide (PRINZIDE,ZESTORETIC) 10-12.5 MG tablet TAKE 1 TABLET BY MOUTH DAILY. 90 tablet 3  . Multiple Vitamins-Minerals (VISION-VITE PRESERVE PO) Take by mouth.    . pravastatin (PRAVACHOL) 40 MG tablet Take 1 tablet (40 mg total) by mouth daily. 90 tablet 3  . calcium carbonate (OS-CAL) 600 MG TABS tablet Take 600 mg by mouth 2 (two) times daily with a meal.     No current facility-administered medications on file prior to visit.   Allergies  Allergen  Reactions  . Metoclopramide Hcl     REACTION: tremors, clinched jaws     REGLAN   Family History  Problem Relation Age of Onset  . Hypertension Father   . Heart disease Father   . Cancer Father     BLADDER & LUNG  . Heart disease Paternal Grandmother   . Alzheimer's disease Mother   . Heart disease Brother   . Heart disease Paternal Grandfather   . Diabetes Paternal Grandfather   . Colon cancer Neg Hx   . Pancreatic cancer Neg Hx   . Stomach cancer Neg Hx    Social History   Social History  . Marital Status: Divorced    Spouse Name: N/A  . Number of Children: N/A  . Years of Education: N/A   Social History Main Topics  . Smoking status: Former Research scientist (life sciences)  . Smokeless tobacco: Never Used  . Alcohol Use: Yes      Comment: Rare  . Drug Use: No  . Sexual Activity: No   Other Topics Concern  . None   Social History Narrative    Review of Systems  Constitutional: Negative for fever, chills, diaphoresis, activity change and appetite change.  Eyes: Negative for visual disturbance.  Respiratory: Negative for cough and shortness of breath.   Cardiovascular: Negative for chest pain, palpitations and leg swelling.  Genitourinary: Negative for decreased urine volume.  Skin: Positive for rash.  Neurological: Positive for numbness. Negative for syncope and headaches.  Psychiatric/Behavioral: Positive for sleep disturbance.       Objective:  BP 110/54 mmHg  Pulse 64  Temp(Src) 98.1 F (36.7 C) (Oral)  Resp 16  Ht 5\' 3"  (1.6 m)  Wt 148 lb 6.4 oz (67.314 kg)  BMI 26.29 kg/m2  SpO2 98%  Physical Exam  Constitutional: She is oriented to person, place, and time. She appears well-developed and well-nourished. No distress.  HENT:  Head: Normocephalic and atraumatic.  Right Ear: External ear normal.  Left Ear: External ear normal.  Eyes: Conjunctivae are normal. No scleral icterus.  Neck: Normal range of motion. Neck supple. No thyromegaly present.  Cardiovascular: Normal rate, regular rhythm, normal heart sounds and intact distal pulses.   Pulmonary/Chest: Effort normal and breath sounds normal. No respiratory distress.  Musculoskeletal: She exhibits no edema.  Lymphadenopathy:    She has no cervical adenopathy.  Neurological: She is alert and oriented to person, place, and time.  Skin: Skin is warm and dry. Rash noted. Rash is maculopapular. She is not diaphoretic. No erythema.     At top of c-spine, base of scalp, midline extending approx 2 cm to each side of midline is serpigineous well defined blanchable macular erythematous rash with severe papules surrounding and 1 pinpoint pustule. No flake or scale, did not fluoresce under woods' lamp  Psychiatric: She has a normal mood and affect. Her  behavior is normal.          Assessment & Plan:  Sched AWV in 6 mos - get cbc (not done ins 1 yr), ua, tsh (not done in almost 4 yrs), vit D (done 2 yrs prior 01/2014) Fasting today.  1. Essential hypertension - well controlled  2. Osteopenia - on ca/vit D with weightbearing exercise.  3. Hyperlipidemia - restarted pravastatin x 6 mos on rec of optho due to chol deposits seen on retinal exam, tolerating well.  4. Prediabetes - 5.7 6 mos prior - recheck  5. Medication monitoring encounter   6. Rash and nonspecific skin eruption -  suspect contact dermatitis of unknown etiology.  Attempted KOH scraping but really unable to obtain any visible epithelial debris.  try top TAC x 2 wks, if worsening at all, call and could try adding in antifungal and/or referring to derm.  Do not think it is shingles as extending equally onto both sides and not progressing further along dermatome.    Orders Placed This Encounter  Procedures  . Hemoglobin A1c  . Comprehensive metabolic panel    Order Specific Question:  Has the patient fasted?    Answer:  Yes  . Lipid panel    Order Specific Question:  Has the patient fasted?    Answer:  Yes  . VITAMIN D 25 Hydroxy (Vit-D Deficiency, Fractures)  . POCT Skin KOH    Meds ordered this encounter  Medications  . triamcinolone (KENALOG) 0.025 % ointment    Sig: Apply 1 application topically 2 (two) times daily. For 2 weeks    Dispense:  80 g    Refill:  0  . famotidine (PEPCID) 20 MG tablet    Sig: Take 1 tablet (20 mg total) by mouth 2 (two) times daily.    Dispense:  180 tablet    Refill:  3    Delman Cheadle, MD MPH

## 2015-12-21 LAB — VITAMIN D 25 HYDROXY (VIT D DEFICIENCY, FRACTURES): Vit D, 25-Hydroxy: 25 ng/mL — ABNORMAL LOW (ref 30–100)

## 2015-12-25 ENCOUNTER — Encounter: Payer: Self-pay | Admitting: Family Medicine

## 2016-04-16 LAB — HM MAMMOGRAPHY: HM Mammogram: NORMAL (ref 0–4)

## 2016-06-04 ENCOUNTER — Ambulatory Visit (INDEPENDENT_AMBULATORY_CARE_PROVIDER_SITE_OTHER): Payer: Medicare Other | Admitting: Family Medicine

## 2016-06-04 VITALS — BP 150/60 | HR 69 | Temp 97.9°F | Resp 18 | Ht 63.5 in | Wt 148.8 lb

## 2016-06-04 DIAGNOSIS — M62838 Other muscle spasm: Secondary | ICD-10-CM | POA: Diagnosis not present

## 2016-06-04 MED ORDER — BACLOFEN 10 MG PO TABS: 10.0000 mg | ORAL_TABLET | Freq: Three times a day (TID) | ORAL | 0 refills | Status: DC | PRN

## 2016-06-04 MED ORDER — NAPROXEN 500 MG PO TABS: 500.0000 mg | ORAL_TABLET | Freq: Two times a day (BID) | ORAL | 0 refills | Status: DC

## 2016-06-04 NOTE — Patient Instructions (Addendum)
Taking the naproxen twice daily as needed for pain relief. This is also an anti-inflammatory.  Take the baclofen up to 3 times a day as needed as a muscle relaxer. Start off using this at night. Do not drive with this.  Keep using heat and ice as we discussed.  Also work on massage and the arm/upper back exercises as we discussed.  It was good to meet you today!   Back Pain, Adult Back pain is very common in adults.The cause of back pain is rarely dangerous and the pain often gets better over time.The cause of your back pain may not be known. Some common causes of back pain include:  Strain of the muscles or ligaments supporting the spine.  Wear and tear (degeneration) of the spinal disks.  Arthritis.  Direct injury to the back. For many people, back pain may return. Since back pain is rarely dangerous, most people can learn to manage this condition on their own. HOME CARE INSTRUCTIONS Watch your back pain for any changes. The following actions may help to lessen any discomfort you are feeling:  Remain active. It is stressful on your back to sit or stand in one place for long periods of time. Do not sit, drive, or stand in one place for more than 30 minutes at a time. Take short walks on even surfaces as soon as you are able.Try to increase the length of time you walk each day.  Exercise regularly as directed by your health care provider. Exercise helps your back heal faster. It also helps avoid future injury by keeping your muscles strong and flexible.  Do not stay in bed.Resting more than 1-2 days can delay your recovery.  Pay attention to your body when you bend and lift. The most comfortable positions are those that put less stress on your recovering back. Always use proper lifting techniques, including:  Bending your knees.  Keeping the load close to your body.  Avoiding twisting.  Find a comfortable position to sleep. Use a firm mattress and lie on your side with  your knees slightly bent. If you lie on your back, put a pillow under your knees.  Avoid feeling anxious or stressed.Stress increases muscle tension and can worsen back pain.It is important to recognize when you are anxious or stressed and learn ways to manage it, such as with exercise.  Take medicines only as directed by your health care provider. Over-the-counter medicines to reduce pain and inflammation are often the most helpful.Your health care provider may prescribe muscle relaxant drugs.These medicines help dull your pain so you can more quickly return to your normal activities and healthy exercise.  Apply ice to the injured area:  Put ice in a plastic bag.  Place a towel between your skin and the bag.  Leave the ice on for 20 minutes, 2-3 times a day for the first 2-3 days. After that, ice and heat may be alternated to reduce pain and spasms.  Maintain a healthy weight. Excess weight puts extra stress on your back and makes it difficult to maintain good posture. SEEK MEDICAL CARE IF:  You have pain that is not relieved with rest or medicine.  You have increasing pain going down into the legs or buttocks.  You have pain that does not improve in one week.  You have night pain.  You lose weight.  You have a fever or chills. SEEK IMMEDIATE MEDICAL CARE IF:   You develop new bowel or bladder control problems.  You have unusual weakness or numbness in your arms or legs.  You develop nausea or vomiting.  You develop abdominal pain.  You feel faint.   This information is not intended to replace advice given to you by your health care provider. Make sure you discuss any questions you have with your health care provider.   Document Released: 08/04/2005 Document Revised: 08/25/2014 Document Reviewed: 12/06/2013 Elsevier Interactive Patient Education 2016 [employer].    IF you received an x-ray today, you will receive an invoice from [facility]. Please  contact [facility] at [phone] with questions or concerns regarding your invoice.   IF you received labwork today, you will receive an invoice from [employer]. Please contact [facility] at [phone] with questions or concerns regarding your invoice.   Our billing staff will not be able to assist you with questions regarding bills from these companies.  You will be contacted with the lab results as soon as they are available. The fastest way to get your results is to activate your My Chart account. Instructions are located on the last page of this paperwork. If you have not heard from [place] regarding the results in 2 weeks, please contact this office.

## 2016-06-04 NOTE — Progress Notes (Signed)
Carol Tucker is a 75 y.o. female who presents to [facility] today for back pain:  1.  Back pain:  Describes aching pain in upper thoracic region of back.  States she drove to [place] this weekend. This was on Friday. While she was driving she was fairly tense because traffic was bad. Started noticing pain right side of her shoulder blade that radiated up to her neck. No headaches. Describes is 4 out of 10. Does not radiate anywhere else. No numbness or tingling or weakness Bilateral upper extremities.Marland Kitchen Has been taking over-the-counter Motrin with some relief. Also using heat at night. This is better at night.  No LE weakness or changes in gait.  No motor weakness/decreased sensation BL LE's.  No fevers or chills.  No dysuria, hematuria, urinary frequency, incontinence of bladder or bowel.      ROS as above.  Pertinently, no chest pain, palpitations, SOB, Fever, Chills, Abd pain, N/V/D.   PMH reviewed. Patient is a nonsmoker.   Past Medical History:  Diagnosis Date  . Allergy   . Anxiety   . Cancer ([place])   . Depression   . GERD (gastroesophageal reflux disease)   . Hyperlipidemia   . Hypertension   . Osteopenia 8-04   DEXA at [facility] 04/14/14 with lowest Tscore -1.7 at Left femoral neck. FRAX does not indicate need for bisphosphonate but density has sig worsened since prior dexa on 12/17/2009  . Prediabetes    Past Surgical History:  Procedure Laterality Date  . APPENDECTOMY    . DILATION AND CURETTAGE OF UTERUS  1969   MISSED AB  . KNEE ARTHROSCOPY  2007  . 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 TUBAL LIGATION  1980  . tumor removal salivary gland      Medications reviewed. Current Outpatient Prescriptions  Medication Sig Dispense Refill  . aspirin EC 81 MG tablet Take 81 mg by mouth daily.    . calcium carbonate (OS-CAL) 600 MG TABS tablet Take 600 mg by mouth 2 (two) times daily with a meal.    . cholecalciferol (VITAMIN D) 1000 UNITS tablet Take 1,000  Units by mouth 2 (two) times daily.    . famotidine (PEPCID) 20 MG tablet Take 1 tablet (20 mg total) by mouth 2 (two) times daily. 180 tablet 3  . lisinopril-hydrochlorothiazide (PRINZIDE,ZESTORETIC) 10-12.5 MG tablet TAKE 1 TABLET BY MOUTH DAILY. 90 tablet 3  . Multiple Vitamins-Minerals (VISION-VITE PRESERVE PO) Take by mouth.    . pravastatin (PRAVACHOL) 40 MG tablet Take 1 tablet (40 mg total) by mouth daily. 90 tablet 3  . triamcinolone (KENALOG) 0.025 % ointment Apply 1 application topically 2 (two) times daily. For 2 weeks 80 g 0   No current facility-administered medications for this visit.      Physical Exam:  BP (!) 150/60   Pulse 69   Temp 97.9 F (36.6 C) (Oral)   Resp 18   Ht 5' 3.5" (1.613 m)   Wt 148 lb 12.8 oz (67.5 kg)   SpO2 99%   BMI 25.95 kg/m  Gen:  Alert, cooperative patient who appears stated age in no acute distress.  Vital signs reviewed. Back:  Normal skin, Spine with normal alignment and no deformity.  No tenderness to vertebral process palpation.  Paraspinous muscles are tender with some mild muscle spasm noted right upper paraSpinal region.   Range of motion is full at neck and lumbar sacral regions.   Neuro:  Sensation and motor function 5/5 bilateral lower extremities.  Patellar and Achilles  DTR's +2 patellar BL.  He is able to walk on his heels and toes without difficulty.      Assessment and Plan:  1.  Upper thoracic muscle spasm: -Naproxen for relief. -Upper body/arm and neck exercises. Discussed this with patient. -Baclofen for relief. -Follow-up in the next 2 weeks if no improvement. Follow up sooner if worsening. No red flags.

## 2016-06-11 ENCOUNTER — Ambulatory Visit (INDEPENDENT_AMBULATORY_CARE_PROVIDER_SITE_OTHER): Payer: Medicare Other | Admitting: Family Medicine

## 2016-06-11 VITALS — BP 130/64 | HR 71 | Temp 97.8°F | Resp 17 | Ht 63.5 in | Wt 150.0 lb

## 2016-06-11 DIAGNOSIS — R0789 Other chest pain: Secondary | ICD-10-CM

## 2016-06-11 DIAGNOSIS — F410 Panic disorder [episodic paroxysmal anxiety] without agoraphobia: Secondary | ICD-10-CM | POA: Diagnosis not present

## 2016-06-11 LAB — TROPONIN I

## 2016-06-11 MED ORDER — ALPRAZOLAM 0.5 MG PO TABS: 0.5000 mg | ORAL_TABLET | Freq: Three times a day (TID) | ORAL | 0 refills | Status: DC | PRN

## 2016-06-11 NOTE — Patient Instructions (Addendum)
Stop the naproxyn.  Start the nexium twice a day. Do not use any other otc pain medication other than tylenol/acetaminophen - so no aleve, ibuprofen, motrin, advil, etc.  Increase your aspirin from the baby 81mg  daily to the full dose 325mg  daily (4 of the baby aspirin or 1 regular) daily. If you have the chest pain, try a xanax.  If it does not take it away within 5 minutes or if you develop any accompanying shortness of breath, sweatiness, it is shooting more, nauseated or worried about your heart, call 911.  Take it easy for the next several days.  If you are getting chest pain during exertion, stop immediately and go to the ER.     IF you received an x-ray today, you will receive an invoice from [facility]. Please contact [facility] at [phone] with questions or concerns regarding your invoice.   IF you received labwork today, you will receive an invoice from [employer]. Please contact [facility] at [phone] with questions or concerns regarding your invoice.   Our billing staff will not be able to assist you with questions regarding bills from these companies.  You will be contacted with the lab results as soon as they are available. The fastest way to get your results is to activate your My Chart account. Instructions are located on the last page of this paperwork. If you have not heard from [place] regarding the results in 2 weeks, please contact this office.    Chest Pain Observation It is often hard to give a specific diagnosis for the cause of chest pain. Among other possibilities your symptoms might be caused by inadequate oxygen delivery to your heart (angina). Angina that is not treated or evaluated can lead to a heart attack (myocardial infarction) or death. Blood tests, electrocardiograms, and X-rays may have been done to help determine a possible cause of your chest pain. After evaluation and observation, your health care provider  has determined that it is unlikely your pain was caused by an unstable condition that requires hospitalization. However, a full evaluation of your pain may need to be completed, with additional diagnostic testing as directed. It is very important to keep your follow-up appointments. Not keeping your follow-up appointments could result in permanent heart damage, disability, or death. If there is any problem keeping your follow-up appointments, you must call your health care provider. HOME CARE INSTRUCTIONS  Due to the slight chance that your pain could be angina, it is important to follow your health care provider's treatment plan and also maintain a healthy lifestyle:  Maintain or work toward achieving a healthy weight.  Stay physically active and exercise regularly.  Decrease your salt intake.  Eat a balanced, healthy diet. Talk to a dietitian to learn about heart-healthy foods.  Increase your fiber intake by including whole grains, vegetables, fruits, and nuts in your diet.  Avoid situations that cause stress, anger, or depression.  Take medicines as advised by your health care provider. Report any side effects to your health care provider. Do not stop medicines or adjust the dosages on your own.  Quit smoking. Do not use nicotine patches or gum until you check with your health care provider.  Keep your blood pressure, blood sugar, and cholesterol levels within normal limits.  Limit alcohol intake to no more than 1 drink per day for women who are not pregnant and 2 drinks per day for men.  Do not abuse drugs. SEEK IMMEDIATE MEDICAL CARE IF:  You have severe chest pain or pressure which may include symptoms such as:  You feel pain or pressure in your arms, neck, jaw, or back.  You have severe back or abdominal pain, feel sick to your stomach (nauseous), or throw up (vomit).  You are sweating profusely.  You are having a fast or irregular heartbeat.  You feel short of breath while  at rest.  You notice increasing shortness of breath during rest, sleep, or with activity.  You have chest pain that does not get better after rest or after taking your usual medicine.  You wake from sleep with chest pain.  You are unable to sleep because you cannot breathe.  You develop a frequent cough or you are coughing up blood.  You feel dizzy, faint, or experience extreme fatigue.  You develop severe weakness, dizziness, fainting, or chills. Any of these symptoms may represent a serious problem that is an emergency. Do not wait to see if the symptoms will go away. Call your local emergency services (911 in the [place] Do not drive yourself to the hospital. MAKE SURE YOU:  Understand these instructions.  Will watch your condition.  Will get help right away if you are not doing well or get worse.   This information is not intended to replace advice given to you by your health care provider. Make sure you discuss any questions you have with your health care provider.   Document Released: 09/06/2010 Document Revised: 08/09/2013 Document Reviewed: 02/03/2013 Elsevier Interactive Patient Education 2016 [place].   Angina Pectoris Angina pectoris, often called angina, is extreme discomfort in the chest, neck, or arm. This is caused by a lack of blood in the middle and thickest layer of the heart wall (myocardium). There are four types of angina:  Stable angina. Stable angina usually occurs in episodes of predictable frequency and duration. It is usually brought on by physical activity, stress, or excitement. Stable angina usually lasts a few minutes and can often be relieved by a medicine that you place under your tongue. This medicine is called sublingual nitroglycerin.  Unstable angina. Unstable angina can occur even when you are doing little or no physical activity. It can even occur while you are sleeping or when you are at rest. It can suddenly increase in severity or  frequency. It may not be relieved by sublingual nitroglycerin, and it can last up to 30 minutes.  Microvascular angina. This type of angina is caused by a disorder of tiny blood vessels called arterioles. Microvascular angina is more common in women. The pain may be more severe and last longer than other types of angina pectoris.  Prinzmetal or variant angina. This type of angina pectoris is rare and usually occurs when you are doing little or no physical activity. It especially occurs in the early morning hours. CAUSES Atherosclerosis is the cause of angina. This is the buildup of fat and cholesterol (plaque) on the inside of the arteries. Over time, the plaque may narrow or block the artery, and this will lessen blood flow to the heart. Plaque can also become weak and break off within a coronary artery to form a clot and cause a sudden blockage. RISK FACTORS Risk factors common to both men and women include:  High cholesterol levels.  High blood pressure (hypertension).  Tobacco use.  Diabetes.  Family history of angina.  Obesity.  Lack of exercise.  A diet high in saturated fats. Women are at greater risk for angina if they  are:  Over age 103.  Postmenopausal. SYMPTOMS Many people do not experience any symptoms during the early stages of angina. As the condition progresses, symptoms common to both men and women may include:  Chest pain.  The pain can be described as a crushing or squeezing in the chest, or a tightness, pressure, fullness, or heaviness in the chest.  The pain can last more than a few minutes, or it can stop and recur.  Pain in the arms, neck, jaw, or back.  Unexplained heartburn or indigestion.  Shortness of breath.  Nausea.  Sudden cold sweats.  Sudden light-headedness. Many women have chest discomfort and some of the other symptoms. However, women often have different (atypical) symptoms, such as:   Fatigue.  Unexplained feelings of  nervousness or anxiety.  Unexplained weakness.  Dizziness or fainting. Sometimes, women may have angina without any symptoms. DIAGNOSIS  Tests to diagnose angina may include:  ECG (electrocardiogram).  Exercise stress test. This looks for signs of blockage when the heart is being exercised.  Pharmacologic stress test. This test looks for signs of blockage when the heart is being stressed with a medicine.  Blood tests.  Coronary angiogram. This is a procedure to look at the coronary arteries to see if there is any blockage. TREATMENT  The treatment of angina may include the following:  Healthy behavioral changes to reduce or control risk factors.  Medicine.  Coronary stenting.A stent helps to keep an artery open.  Coronary angioplasty. This procedure widens a narrowed or blocked artery.  Coronary arterybypass surgery. This will allow your blood to pass the blockage (bypass) to reach your heart. HOME CARE INSTRUCTIONS   Take medicines only as directed by your health care provider.  Do not take the following medicines unless your health care provider approves:  Nonsteroidal anti-inflammatory drugs (NSAIDs), such as ibuprofen, naproxen, or celecoxib.  Vitamin supplements that contain vitamin A, vitamin E, or both.  Hormone replacement therapy that contains estrogen with or without progestin.  Manage other health conditions such as hypertension and diabetes as directed by your health care provider.  Follow a heart-healthy diet. A dietitian can help to educate you about healthy food options and changes.  Use healthy cooking methods such as roasting, grilling, broiling, baking, poaching, steaming, or stir-frying. Talk to a dietitian to learn more about healthy cooking methods.  Follow an exercise program approved by your health care provider.  Maintain a healthy weight. Lose weight as approved by your health care provider.  Plan rest periods when fatigued.  Learn to  manage stress.  Do not use any tobacco products, including cigarettes, chewing tobacco, or electronic cigarettes. If you need help quitting, ask your health care provider.  If you drink alcohol, and your health care provider approves, limit your alcohol intake to no more than 1 drink per day. One drink equals 12 ounces of beer, 5 ounces of wine, or 1 ounces of hard liquor.  Stop illegal drug use.  Keep all follow-up visits as directed by your health care provider. This is important. SEEK IMMEDIATE MEDICAL CARE IF:   You have pain in your chest, neck, arm, jaw, stomach, or back that lasts more than a few minutes, is recurring, or is unrelieved by taking sublingualnitroglycerin.  You have profuse sweating without cause.  You have unexplained:  Heartburn or indigestion.  Shortness of breath or difficulty breathing.  Nausea or vomiting.  Fatigue.  Feelings of nervousness or anxiety.  Weakness.  Diarrhea.  You have sudden  light-headedness or dizziness.  You faint. These symptoms may represent a serious problem that is an emergency. Do not wait to see if the symptoms will go away. Get medical help right away. Call your local emergency services (911 in the [place] Do not drive yourself to the hospital.   This information is not intended to replace advice given to you by your health care provider. Make sure you discuss any questions you have with your health care provider.   Document Released: 08/04/2005 Document Revised: 08/25/2014 Document Reviewed: 12/06/2013 Elsevier Interactive Patient Education [employer].
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
## 2016-06-13 ENCOUNTER — Ambulatory Visit: Payer: Medicare Other

## 2016-06-16 ENCOUNTER — Telehealth: Payer: Self-pay | Admitting: *Deleted

## 2016-06-16 NOTE — Telephone Encounter (Signed)
Patient notified of bone density results.  Osteopenia has worsened a little since 2015.  Max out Ca/D supp with weight bearing exercise.  Recheck in 2 years.    Patient asked [place] to call in rx for alprazolam on 06/11/16 and we did not.  Rx called in for her today.

## 2016-06-19 ENCOUNTER — Encounter: Payer: Self-pay | Admitting: Family Medicine

## 2016-06-30 ENCOUNTER — Encounter: Payer: Self-pay | Admitting: Family Medicine

## 2016-07-07 ENCOUNTER — Encounter: Payer: Self-pay | Admitting: Cardiovascular Disease

## 2016-07-07 ENCOUNTER — Ambulatory Visit (INDEPENDENT_AMBULATORY_CARE_PROVIDER_SITE_OTHER): Payer: Medicare Other | Admitting: Cardiovascular Disease

## 2016-07-07 VITALS — BP 104/58 | HR 71 | Ht 63.5 in | Wt 148.4 lb

## 2016-07-07 DIAGNOSIS — R0789 Other chest pain: Secondary | ICD-10-CM | POA: Diagnosis not present

## 2016-07-07 DIAGNOSIS — R079 Chest pain, unspecified: Secondary | ICD-10-CM | POA: Diagnosis not present

## 2016-07-07 NOTE — Progress Notes (Signed)
Cardiology Office Note   Date:  07/07/2016   ID:  Carol Tucker, DOB 1940-09-17, MRN [id number]  PCP:  Delman Cheadle, MD  Cardiologist:   Mertie Moores, MD   Chief Complaint  Patient presents with  . Chest Pain      Nov. 20, 2017:   Carol Tucker is a 75 y.o. female who presents for Further evaluation of an episode of chest pain.  She has a history of hyperlipidemia, hypertension, glucose intolerance, gastroesophageal reflux disease, panic attacks   She has seen me in the past.     She has had some recent episodes of chest tightness.   She was on some steroid eyedrops for cataract surgery. She thinks that these might have contributed to some of the chest pain.  She tries exercise some at home. [place]  so she does not get out and walk.  Exercise and working out does not seem to bring on any episodes of chest pain. She denies any sick. Presyncope or exertional shortness of breath.  Avoids salty foods. She overall follows a very good diet.  Past Medical History:  Diagnosis Date  . Allergy   . Anxiety   . Cancer ([place])   . Depression   . GERD (gastroesophageal reflux disease)   . Hyperlipidemia   . Hypertension   . Osteopenia 8-04   DEXA at [facility] 04/14/14 with lowest Tscore -1.7 at Left femoral neck. FRAX does not indicate need for bisphosphonate but density has sig worsened since prior dexa on 12/17/2009  . Prediabetes     Past Surgical History:  Procedure Laterality Date  . APPENDECTOMY    . DILATION AND CURETTAGE OF UTERUS  1969   MISSED AB  . KNEE ARTHROSCOPY  2007  . 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 TUBAL LIGATION  1980  . tumor removal salivary gland       Current Outpatient Prescriptions  Medication Sig Dispense Refill  . ALPRAZolam (XANAX) 0.5 MG tablet Take 1 tablet (0.5 mg total) by mouth 3 (three) times daily as needed for anxiety (chest pain). 30 tablet 0  . aspirin EC 81 MG tablet Take 81 mg by mouth daily.    .  baclofen (LIORESAL) 10 MG tablet Take 1 tablet (10 mg total) by mouth 3 (three) times daily as needed for muscle spasms. 25 each 0  . calcium carbonate (OS-CAL) 600 MG TABS tablet Take 600 mg by mouth 2 (two) times daily with a meal.    . cholecalciferol (VITAMIN D) 1000 UNITS tablet Take 1,000 Units by mouth 2 (two) times daily.    . famotidine (PEPCID) 20 MG tablet Take 1 tablet (20 mg total) by mouth 2 (two) times daily. 180 tablet 3  . ketorolac (ACULAR) 0.4 % SOLN Place 1 drop into the left eye 4 (four) times daily.     Marland Kitchen lisinopril-hydrochlorothiazide (PRINZIDE,ZESTORETIC) 10-12.5 MG tablet TAKE 1 TABLET BY MOUTH DAILY. 90 tablet 3  . Multiple Vitamins-Minerals (VISION-VITE PRESERVE PO) Take 1 tablet by mouth daily.     Marland Kitchen ofloxacin (OCUFLOX) 0.3 % ophthalmic solution Place 1 drop into the left eye 4 (four) times daily.     . pravastatin (PRAVACHOL) 40 MG tablet Take 1 tablet (40 mg total) by mouth daily. 90 tablet 3  . prednisoLONE acetate (PRED FORTE) 1 % ophthalmic suspension Place 1 drop into the left eye 4 (four) times daily.     Marland Kitchen triamcinolone (KENALOG) 0.025 % ointment  Apply 1 application topically 2 (two) times daily. For 2 weeks 80 g 0   No current facility-administered medications for this visit.     Allergies:   Metoclopramide hcl    Social History:  The patient  reports that she has quit smoking. She has never used smokeless tobacco. She reports that she drinks alcohol. She reports that she does not use drugs.   Family History:  The patient's family history includes Alzheimer's disease in her mother; Cancer in her father; Diabetes in her paternal grandfather; Heart disease in her brother, father, paternal grandfather, and paternal grandmother; Hypertension in her father.    ROS:  Please see the history of present illness.    Review of Systems: Constitutional:  denies fever, chills, diaphoresis, appetite change and fatigue.  HEENT: denies photophobia, eye pain, redness,  hearing loss, ear pain, congestion, sore throat, rhinorrhea, sneezing, neck pain, neck stiffness and tinnitus.  Respiratory: denies SOB, DOE, cough, chest tightness, and wheezing.  Cardiovascular: denies chest pain, palpitations and leg swelling.  Gastrointestinal: denies nausea, vomiting, abdominal pain, diarrhea, constipation, blood in stool.  Genitourinary: denies dysuria, urgency, frequency, hematuria, flank pain and difficulty urinating.  Musculoskeletal: denies  myalgias, back pain, joint swelling, arthralgias and gait problem.   Skin: denies pallor, rash and wound.  Neurological: denies dizziness, seizures, syncope, weakness, light-headedness, numbness and headaches.   Hematological: denies adenopathy, easy bruising, personal or family bleeding history.  Psychiatric/ Behavioral: denies suicidal ideation, mood changes, confusion, nervousness, sleep disturbance and agitation.       All other systems are reviewed and negative.    PHYSICAL EXAM: VS:  BP (!) 104/58 (BP Location: Right Arm, Cuff Size: Normal)   Pulse 71   Ht 5' 3.5" (1.613 m)   Wt 148 lb 6.4 oz (67.3 kg)   BMI 25.88 kg/m  , BMI Body mass index is 25.88 kg/m. GEN: Well nourished, well developed, in no acute distress  HEENT: normal  Neck: no JVD, carotid bruits, or masses Cardiac: RRR; no murmurs, rubs, or gallops,no edema  Respiratory:  clear to auscultation bilaterally, normal work of breathing GI: soft, nontender, nondistended, + BS MS: no deformity or atrophy  Skin: warm and dry, no rash Neuro:  Strength and sensation are intact Psych: normal   EKG:  EKG is ordered today. The ekg ordered today demonstrates normal sinus rhythm at 71. EKG is normal.   Recent Labs: 12/20/2015: ALT 8; BUN 17; Creat 0.85; Potassium 4.3; Sodium 137    Lipid Panel    Component Value Date/Time   CHOL 167 12/20/2015 0826   TRIG 179 (H) 12/20/2015 0826   HDL 41 (L) 12/20/2015 0826   CHOLHDL 4.1 12/20/2015 0826   VLDL 36 (H)  12/20/2015 0826   LDLCALC 90 12/20/2015 0826      Wt Readings from Last 3 Encounters:  07/07/16 148 lb 6.4 oz (67.3 kg)  06/11/16 150 lb (68 kg)  06/04/16 148 lb 12.8 oz (67.5 kg)      Other studies Reviewed: Additional studies/ records that were reviewed today include: . Review of the above records demonstrates:    ASSESSMENT AND PLAN:  1.  Atypical chest pain: Carol Tucker presents to see me today for atypical chest pain. These episodes occur at there is times do not sound like angina. They're not related to exertion. She has stopped some of her steroid eyedrop medications and her symptoms seem to be much better.  This point I've reassured her that these are fairly benign. I have  encouraged her to exercise on a regular basis. She's able to exercise without having any episodes of chest discomfort then I think that we can be fairly sure that these are noncardiac.   I will see her back as needed.   Current medicines are reviewed at length with the patient today.  The patient does not have concerns regarding medicines.  Labs/ tests ordered today include:  No orders of the defined types were placed in this encounter.  Disposition:   FU with me  As needed    Mertie Moores, MD  07/07/2016 2:56 PM    [address], [place], [place]  [address] Phone: [phone]; Fax: [phone]

## 2016-07-07 NOTE — Patient Instructions (Signed)
Medication Instructions:  Your physician recommends that you continue on your current medications as directed. Please refer to the Current Medication list given to you today.   Labwork: None Ordered   Testing/Procedures: None Ordered   Follow-Up: Your physician recommends that you schedule a follow-up appointment in: as needed with Dr. Nahser   If you need a refill on your cardiac medications before your next appointment, please call your pharmacy.   Thank you for choosing [facility]! [place] Oborn, RN [phone]    

## 2016-07-24 ENCOUNTER — Other Ambulatory Visit: Payer: Self-pay | Admitting: Family Medicine

## 2016-08-23 ENCOUNTER — Other Ambulatory Visit: Payer: Self-pay | Admitting: Family Medicine

## 2016-09-09 NOTE — Progress Notes (Signed)
Patient ID: Carol Tucker, female   DOB: [birth year], 76 y.o.   MRN: [id number] Subjective:   Chief Complaint  Patient presents with  . Annual Exam     Carol Tucker is a 76 y.o. female who presents for Medicare Annual/Subsequent preventive examination.  Last AWV 1 yr prior.  Preventive Screening-Counseling & Management  Tobacco History  Smoking Status  . Former Smoker  Smokeless Tobacco  . Never Used     Problems Prior to Visit 1. HTN: Controlled on lisinopril-hctz 10-12.5 - checks BP at home. Was seen 2 mos prior by Dr. Acie Fredrickson, cardiology, as she had been happening episodes of nonexertional atypical chest pain. Fortunately cardiology was able to reassure her that these are noncardiac and she has no restrictions to exercise. She did stop her steroid eyedrops for cataracts and felt that her symptoms were improving. Had a nml chemical stress test in 2010. 2.  Osteopenia: On Ca/Vit D supp gummy. DEXA bone scan 03/2016 had worsened a little since 2015 so she was advised to increase her weight bearing exercise and we will recheck in 2 years 3. HPL: on pravastatin 40, tolerating well. 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 Last lipid panel 8 mos prior LDL 90 and non-hdl 126 4.  Pre-DM borderline - a1c 5.7 -> 6.1 8 mos prior 5.  Microscopic hematuria- was intermittent for years, never saw urology, no change in urine.  6.  Cataracts s/p surgery - followed by Dr. Katy Fitch, on steroid eye drops. Seen annually around Oct 7.  Left lower varicose veins - going [facility] have surg [facility]? 8.   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  GERD - per pepcid vs otc omeprazole? 10.  Anxiety with rare panic attacks- prn alprazolam 0.5 mg tid,  uses very rarely.  Current Problems (verified) Patient Active Problem List   Diagnosis Date Noted  . Atypical chest pain 07/07/2016  . Prediabetes 12/20/2015  . Microscopic hematuria 01/26/2015  . Polypharmacy 01/26/2015  . Carpal tunnel syndrome of right wrist 01/26/2015  . Panic attack 01/26/2015  . Migraine 04/05/2012  . Hyperlipidemia   . GERD (gastroesophageal reflux disease)   . Osteopenia   . HTN (hypertension) 11/04/2011    Medications Prior to Visit Current Outpatient Prescriptions on File Prior to Visit  Medication Sig Dispense Refill  . ALPRAZolam (XANAX) 0.5 MG tablet Take 1 tablet (0.5 mg total) by mouth 3 (three) times daily as needed for anxiety (chest pain). 30 tablet 0  . aspirin EC 81 MG tablet Take 81 mg by mouth daily.    . baclofen (LIORESAL) 10 MG tablet Take 1 tablet (10 mg total) by mouth 3 (three) times daily as needed for muscle spasms. 25 each 0  . calcium carbonate (OS-CAL) 600 MG TABS tablet Take 600 mg by mouth 2 (two) times daily with a meal.    . cholecalciferol (VITAMIN D) 1000 UNITS tablet Take 1,000 Units by mouth 2 (two) times daily.    . famotidine (PEPCID) 20 MG tablet TAKE 1 TABLET BY MOUTH TWICE DAILY 30 tablet 0  . ketorolac (ACULAR) 0.4 % SOLN Place 1 drop into the left eye 4 (four) times daily.     Marland Kitchen lisinopril-hydrochlorothiazide (  PRINZIDE,ZESTORETIC) 10-12.5 MG tablet TAKE 1 TABLET BY MOUTH DAILY. 30 tablet 0  . Multiple Vitamins-Minerals (VISION-VITE PRESERVE PO) Take 1 tablet by mouth daily.     Marland Kitchen ofloxacin (OCUFLOX) 0.3 % ophthalmic solution Place 1 drop into the left eye 4 (four) times daily.     . pravastatin (PRAVACHOL) 40 MG tablet TAKE 1 TABLET BY MOUTH DAILY 90 tablet 0  . prednisoLONE acetate (PRED FORTE) 1 % ophthalmic suspension Place 1 drop into the left eye 4 (four) times daily.     Marland Kitchen triamcinolone (KENALOG) 0.025 % ointment Apply 1 application topically 2 (two) times daily. For 2 weeks 80 g 0   No current  facility-administered medications on file prior to visit.     Current Medications (verified) Current Outpatient Prescriptions  Medication Sig Dispense Refill  . ALPRAZolam (XANAX) 0.5 MG tablet Take 1 tablet (0.5 mg total) by mouth 3 (three) times daily as needed for anxiety (chest pain). 30 tablet 0  . aspirin EC 81 MG tablet Take 81 mg by mouth daily.    . baclofen (LIORESAL) 10 MG tablet Take 1 tablet (10 mg total) by mouth 3 (three) times daily as needed for muscle spasms. 25 each 0  . calcium carbonate (OS-CAL) 600 MG TABS tablet Take 600 mg by mouth 2 (two) times daily with a meal.    . cholecalciferol (VITAMIN D) 1000 UNITS tablet Take 1,000 Units by mouth 2 (two) times daily.    . famotidine (PEPCID) 20 MG tablet TAKE 1 TABLET BY MOUTH TWICE DAILY 30 tablet 0  . ketorolac (ACULAR) 0.4 % SOLN Place 1 drop into the left eye 4 (four) times daily.     Marland Kitchen lisinopril-hydrochlorothiazide (PRINZIDE,ZESTORETIC) 10-12.5 MG tablet TAKE 1 TABLET BY MOUTH DAILY. 30 tablet 0  . Multiple Vitamins-Minerals (VISION-VITE PRESERVE PO) Take 1 tablet by mouth daily.     Marland Kitchen ofloxacin (OCUFLOX) 0.3 % ophthalmic solution Place 1 drop into the left eye 4 (four) times daily.     . pravastatin (PRAVACHOL) 40 MG tablet TAKE 1 TABLET BY MOUTH DAILY 90 tablet 0  . prednisoLONE acetate (PRED FORTE) 1 % ophthalmic suspension Place 1 drop into the left eye 4 (four) times daily.     Marland Kitchen triamcinolone (KENALOG) 0.025 % ointment Apply 1 application topically 2 (two) times daily. For 2 weeks 80 g 0   No current facility-administered medications for this visit.      Allergies (verified) Metoclopramide hcl   PAST HISTORY  Family History Family History  Problem Relation Age of Onset  . Hypertension Father   . Heart disease Father   . Cancer Father     BLADDER & LUNG  . Heart disease Paternal Grandmother   . Alzheimer's disease Mother   . Heart disease Brother   . Heart disease Paternal Grandfather   . Diabetes  Paternal Grandfather   . Colon cancer Neg Hx   . Pancreatic cancer Neg Hx   . Stomach cancer Neg Hx     Social History Social History  Substance Use Topics  . Smoking status: Former Research scientist (life sciences)  . Smokeless tobacco: Never Used  . Alcohol use Yes     Comment: Rare     Are there smokers in your home (other than you)? No  Risk Factors Current exercise habits: Home exercise routine includes walking 1 1/2 hrs per week. She does some wall push-ups and leg strengthening exercises at home as well. Dietary issues discussed: tries to eat healthy, avoids salty foods -  would like to lose 5 lbs  Cardiac risk factors: advanced age (older than 42 for men, 64 for women), dyslipidemia and hypertension.  Depression Screen Depression screen [facility] 2/9 06/11/2016 06/04/2016 12/20/2015 10/27/2015 06/21/2015  Decreased Interest 0 0 0 0 0  Down, Depressed, Hopeless 0 0 0 0 0  PHQ - 2 Score 0 0 0 0 0    Activities of Daily Living In your present state of health, do you have any difficulty performing the following activities?:  Driving? No Managing money?  No Feeding yourself? No Getting from bed to chair? No Climbing a flight of stairs? No  - careful going down stairs Preparing food and eating?: No Bathing or showering? No - Has a walk-in shower with a bar to hold onto Getting dressed: No Getting to the toilet? No Using the toilet:No Moving around from place to place: No In the past year have you fallen or had a near fall?:No   Are you sexually active?  No  Do you have more than one partner?  No  Hearing Difficulties: No Do you often ask people to speak up or repeat themselves? No Do you experience ringing or noises in your ears? Yes - has had for several years and is not changing, no usually bothersome to her Do you have difficulty understanding soft or whispered voices? No   Do you feel that you have a problem with memory? No  Do you often misplace items? No  Do you feel safe at home?   Yes  Cognitive Testing  Alert? Yes  Normal Appearance?Yes  Oriented to person? Yes  Place? Yes   Time? Yes  Recall of three objects?  Yes  Can perform simple calculations? Yes  Displays appropriate judgment?Yes  Can read the correct time from a watch face?Yes   Advanced Directives have been discussed with the patient? Yes -   List the Names of Other Physician/Practitioners you currently use: 1.  GI - Dr. Joanell Rising  2.  Dentist - Dr. Trey Paula 3.  Dermatologist - Dr. Fontaine No 4. Prior gynecologist retired 24.  Neurologist - Dr. Erling Cruz (retired) 3..  Cardiologist - Dr. Acie Fredrickson 7.  Surgery - Dr. Hassell Done 8.  Optho - Dr. Katy Fitch   Indicate any recent Medical Services you may have received from other than [facility] providers in the past year (date may be approximate).  Immunization History  Administered Date(s) Administered  . Influenza,inj,Quad PF,36+ Mos 05/06/2013  . Influenza-Unspecified 05/18/2014, 05/09/2015, 05/16/2016  . Pneumococcal Conjugate-13 06/21/2015  . Pneumococcal Polysaccharide-23 04/05/2012    Screening Tests Health Maintenance  Topic Date Due  . ZOSTAVAX  07/15/2002  . MAMMOGRAM  04/16/2018  . TETANUS/TDAP  06/01/2022  . COLONOSCOPY  06/28/2023  . INFLUENZA VACCINE  Completed  . DEXA SCAN  Completed  . PNA vac Low Risk Adult  Completed    All answers were reviewed with the patient and necessary referrals were made:  SHAW,EVA, MD   09/09/2016   History reviewed: allergies, current medications, past family history, past medical history, past social history, past surgical history and problem list  Review of Systems A comprehensive review of systems was negative except for: Eyes: positive for visual disturbance, eye itching Ears, nose, mouth, throat, and face: positive for tinnitus Allergic/Immunologic: positive for hay fever    Objective:   No exam data present There were no vitals taken for this visit.  General Appearance:    Alert,  cooperative, no distress, appears stated age  Head:  Normocephalic, without obvious abnormality, atraumatic  Eyes:    PERRL, conjunctiva/corneas clear, EOM's intact, fundi    benign, both eyes  Ears:    Normal TM's and external ear canals, both ears  Nose:   Nares normal, septum midline, mucosa normal, no drainage    or sinus tenderness  Throat:   Lips, mucosa, and tongue normal; teeth and gums normal  Neck:   Supple, symmetrical, trachea midline, no adenopathy;    thyroid:  no enlargement/tenderness/nodules; no carotid   bruit or JVD  Back:     Symmetric, no curvature, ROM normal, no CVA tenderness  Lungs:     Clear to auscultation bilaterally, respirations unlabored  Chest Wall:    No tenderness or deformity   Heart:    Regular rate and rhythm, S1 and S2 normal, no murmur, rub   or gallop  Breast Exam:    No tenderness, masses, or nipple abnormality  Abdomen:     Soft, non-tender, bowel sounds active all four quadrants,    no masses, no organomegaly  Genitalia:    Normal female without lesion, discharge or tenderness  Rectal:    Normal tone, normal prostate, no masses or tenderness;   guaiac negative stool  Extremities:   Extremities normal, atraumatic, no cyanosis or edema  Pulses:   2+ and symmetric all extremities  Skin:   Skin color, texture, turgor normal, no rashes or lesions  Lymph nodes:   Cervical, supraclavicular, and axillary nodes normal  Neurologic:   CNII-XII intact, normal strength, sensation and reflexes    throughout       Assessment:     1. Medicare annual wellness visit, subsequent   2. Essential hypertension   3. Osteopenia   4. Microscopic hematuria   5. Hyperlipidemia   6. Polypharmacy   7. Need for zoster vaccination   8. Need for prophylactic vaccination against Streptococcus pneumoniae (pneumococcus)          Plan:  Ok to refill BP meds and pepcid x 1 yr whenever pharmacy request   During the course of the visit the patient was educated and  counseled about appropriate screening and preventive services including:    Pneumococcal vaccine:  pneumovax-23 done 03/2012,  prevnar done today.  Influenza vaccine: done this year  Td vaccine: TDaP done 2013  Screening electrocardiogram  Diabetes screening  Pap smear: No h/o abnml, none further needed due to age  Advanced directives: has an advanced directive - a copy HAS NOT been provided.   Colorectal cancer screening:  Colonoscopy 06/2013 by Dr. Henrene Pastor - has h/o adenomatous colon polyps but most recent in 2014 was neg/normal so ok to repeat in 10 yrs if indicated at that time though will likely age out.  Zostavax done around 2014  Bone densitometry screening: D[facility]A scan 03/2016 [facility] shows worsening osteopenia that may have mildly worsened since 2015 with total Lt hip T-score of -2.1.  Pt w/o sig risk factors but FRAX does give 11% any fracture and 1.9% hip fracture likelihood. vit D level had decreased to 25 8 mos prior from 45 the year before so pt was instructed to increase her otc Vit D supp dose.  Screening mammography: Mammogram UTD as done on 03/2016 and [facility] at [facility]  Does NOT need Hep C screen since born prior to 1944, no need for HIV for lifelong monogamy  Diet review for nutrition referral? Yes ____  Not Indicated _X___   Patient Instructions (the written p lan) was given  to the patient.  Medicare Attestation I have personally reviewed: The patient's medical and social history Their use of alcohol, tobacco or illicit drugs Their current medications and supplements The patient's functional ability including ADLs,fall risks, home safety risks, cognitive, and hearing and visual impairment Diet and physical activities Evidence for depression or mood disorders  The patient's weight, height, BMI, and visual acuity have been recorded in the chart.  I have made referrals, counseling, and provided education to the patient based on review of the above and I have  provided the patient with a written personalized care plan for preventive services.     SHAW,EVA, MD   09/09/2016     Vit D, tsh, ua, cbc, cmp, lipid, a1c Has a h/o arthroscopic surgery on her right knee by Dr. Veverly Fells, req eval by Dr. Marlou Sa wanting to avoid surg of course L>R knee, does not want to take nsiads.  Right swollen.  No locking, popping, giving out. Does have a neoprene brace she was using to help support.     grandson captain of tug boat - in school for Send all chronic meds into [facility]\ Cutting back on carbs and sugars.  1. Medicare annual wellness visit, subsequent   2. Screening for cardiovascular, respiratory, and genitourinary diseases   3. Screening for deficiency anemia   4. Screening for thyroid disorder   5. Essential hypertension   6. Osteopenia, unspecified location   7. Microscopic hematuria   8. Prediabetes   9. Polypharmacy   10. Panic attack   11. Hyperlipidemia, unspecified hyperlipidemia type   12. Vitamin D deficiency     Orders Placed This Encounter  Procedures  . VITAMIN D 25 Hydroxy (Vit-D Deficiency, Fractures)  . Lipid panel    Order Specific Question:   Has the patient fasted?    Answer:   Yes  . TSH  . Comprehensive metabolic panel    Order Specific Question:   Has the patient fasted?    Answer:   Yes  . CBC  . Hemoglobin A1c  . Care order/instruction:    Scheduling Instructions:     Complete orders, AVS and go.  Marland Kitchen POCT urinalysis dipstick     Delman Cheadle, M.D.  Primary Care at [facility] [address] [place], [place] [address] [phone] phone [phone] fax  10/04/16 12:03 AM

## 2016-09-11 ENCOUNTER — Ambulatory Visit (INDEPENDENT_AMBULATORY_CARE_PROVIDER_SITE_OTHER): Payer: Medicare Other | Admitting: Family Medicine

## 2016-09-11 ENCOUNTER — Encounter: Payer: Self-pay | Admitting: Family Medicine

## 2016-09-11 VITALS — BP 130/60 | HR 69 | Temp 97.8°F | Resp 16 | Ht 63.0 in | Wt 148.6 lb

## 2016-09-11 DIAGNOSIS — I10 Essential (primary) hypertension: Secondary | ICD-10-CM | POA: Diagnosis not present

## 2016-09-11 DIAGNOSIS — R7303 Prediabetes: Secondary | ICD-10-CM

## 2016-09-11 DIAGNOSIS — E785 Hyperlipidemia, unspecified: Secondary | ICD-10-CM

## 2016-09-11 DIAGNOSIS — Z0000 Encounter for general adult medical examination without abnormal findings: Secondary | ICD-10-CM

## 2016-09-11 DIAGNOSIS — Z136 Encounter for screening for cardiovascular disorders: Secondary | ICD-10-CM | POA: Diagnosis not present

## 2016-09-11 DIAGNOSIS — Z79899 Other long term (current) drug therapy: Secondary | ICD-10-CM | POA: Diagnosis not present

## 2016-09-11 DIAGNOSIS — M8580 Other specified disorders of bone density and structure, unspecified site: Secondary | ICD-10-CM

## 2016-09-11 DIAGNOSIS — E559 Vitamin D deficiency, unspecified: Secondary | ICD-10-CM | POA: Diagnosis not present

## 2016-09-11 DIAGNOSIS — Z1329 Encounter for screening for other suspected endocrine disorder: Secondary | ICD-10-CM | POA: Diagnosis not present

## 2016-09-11 DIAGNOSIS — Z1389 Encounter for screening for other disorder: Secondary | ICD-10-CM

## 2016-09-11 DIAGNOSIS — F410 Panic disorder [episodic paroxysmal anxiety] without agoraphobia: Secondary | ICD-10-CM

## 2016-09-11 DIAGNOSIS — Z130 Encounter for screening for diseases of the blood and blood-forming organs and certain disorders involving the immune mechanism: Secondary | ICD-10-CM | POA: Diagnosis not present

## 2016-09-11 DIAGNOSIS — R3129 Other microscopic hematuria: Secondary | ICD-10-CM

## 2016-09-11 DIAGNOSIS — Z1383 Encounter for screening for respiratory disorder NEC: Secondary | ICD-10-CM

## 2016-09-11 LAB — POCT URINALYSIS DIP (MANUAL ENTRY)
BILIRUBIN UA: NEGATIVE
Bilirubin, UA: NEGATIVE
Glucose, UA: NEGATIVE
Leukocytes, UA: NEGATIVE
Nitrite, UA: NEGATIVE
PH UA: 6
PROTEIN UA: NEGATIVE
UROBILINOGEN UA: 0.2

## 2016-09-11 NOTE — Patient Instructions (Addendum)
   IF you received an x-ray today, you will receive an invoice from [facility]. Please contact [facility] at [phone] with questions or concerns regarding your invoice.   IF you received labwork today, you will receive an invoice from [facility]. Please contact [facility] at [phone] with questions or concerns regarding your invoice.   Our billing staff will not be able to assist you with questions regarding bills from these companies.  You will be contacted with the lab results as soon as they are available. The fastest way to get your results is to activate your My Chart account. Instructions are located on the last page of this paperwork. If you have not heard from us regarding the results in 2 weeks, please contact this office.     Health Maintenance, Female Introduction Adopting a healthy lifestyle and getting preventive care can go a long way to promote health and wellness. Talk with your health care provider about what schedule of regular examinations is right for you. This is a good chance for you to check in with your provider about disease prevention and staying healthy. In between checkups, there are plenty of things you can do on your own. Experts have done a lot of research about which lifestyle changes and preventive measures are most likely to keep you healthy. Ask your health care provider for more information. Weight and diet Eat a healthy diet  Be sure to include plenty of vegetables, fruits, low-fat dairy products, and lean protein.  Do not eat a lot of foods high in solid fats, added sugars, or salt.  Get regular exercise. This is one of the most important things you can do for your health.  Most adults should exercise for at least 150 minutes each week. The exercise should increase your heart rate and make you sweat (moderate-intensity exercise).  Most adults should also do strengthening exercises at least twice a week. This is in addition to  the moderate-intensity exercise. Maintain a healthy weight  Body mass index (BMI) is a measurement that can be used to identify possible weight problems. It estimates body fat based on height and weight. Your health care provider can help determine your BMI and help you achieve or maintain a healthy weight.  For females 20 years of age and older:  A BMI below 18.5 is considered underweight.  A BMI of 18.5 to 24.9 is normal.  A BMI of 25 to 29.9 is considered overweight.  A BMI of 30 and above is considered obese. Watch levels of cholesterol and blood lipids  You should start having your blood tested for lipids and cholesterol at 75 years of age, then have this test every 5 years.  You may need to have your cholesterol levels checked more often if:  Your lipid or cholesterol levels are high.  You are older than 75 years of age.  You are at high risk for heart disease. Cancer screening Lung Cancer  Lung cancer screening is recommended for adults 55-80 years old who are at high risk for lung cancer because of a history of smoking.  A yearly low-dose CT scan of the lungs is recommended for people who:  Currently smoke.  Have quit within the past 15 years.  Have at least a 30-pack-year history of smoking. A pack year is smoking an average of one pack of cigarettes a day for 1 year.  Yearly screening should continue until it has been 15 years since you quit.  Yearly screening should   stop if you develop a health problem that would prevent you from having lung cancer treatment. Breast Cancer  Practice breast self-awareness. This means understanding how your breasts normally appear and feel.  It also means doing regular breast self-exams. Let your health care provider know about any changes, no matter how small.  If you are in your 20s or 30s, you should have a clinical breast exam (CBE) by a health care provider every 1-3 years as part of a regular health exam.  If you are 40  or older, have a CBE every year. Also consider having a breast X-ray (mammogram) every year.  If you have a family history of breast cancer, talk to your health care provider about genetic screening.  If you are at high risk for breast cancer, talk to your health care provider about having an MRI and a mammogram every year.  Breast cancer gene (BRCA) assessment is recommended for women who have family members with BRCA-related cancers. BRCA-related cancers include:  Breast.  Ovarian.  Tubal.  Peritoneal cancers.  Results of the assessment will determine the need for genetic counseling and BRCA1 and BRCA2 testing. Cervical Cancer  Your health care provider may recommend that you be screened regularly for cancer of the pelvic organs (ovaries, uterus, and vagina). This screening involves a pelvic examination, including checking for microscopic changes to the surface of your cervix (Pap test). You may be encouraged to have this screening done every 3 years, beginning at age 21.  For women ages 30-65, health care providers may recommend pelvic exams and Pap testing every 3 years, or they may recommend the Pap and pelvic exam, combined with testing for human papilloma virus (HPV), every 5 years. Some types of HPV increase your risk of cervical cancer. Testing for HPV may also be done on women of any age with unclear Pap test results.  Other health care providers may not recommend any screening for nonpregnant women who are considered low risk for pelvic cancer and who do not have symptoms. Ask your health care provider if a screening pelvic exam is right for you.  If you have had past treatment for cervical cancer or a condition that could lead to cancer, you need Pap tests and screening for cancer for at least 20 years after your treatment. If Pap tests have been discontinued, your risk factors (such as having a new sexual partner) need to be reassessed to determine if screening should resume.  Some women have medical problems that increase the chance of getting cervical cancer. In these cases, your health care provider may recommend more frequent screening and Pap tests. Colorectal Cancer  This type of cancer can be detected and often prevented.  Routine colorectal cancer screening usually begins at 75 years of age and continues through 75 years of age.  Your health care provider may recommend screening at an earlier age if you have risk factors for colon cancer.  Your health care provider may also recommend using home test kits to check for hidden blood in the stool.  A small camera at the end of a tube can be used to examine your colon directly (sigmoidoscopy or colonoscopy). This is done to check for the earliest forms of colorectal cancer.  Routine screening usually begins at age 50.  Direct examination of the colon should be repeated every 5-10 years through 75 years of age. However, you may need to be screened more often if early forms of precancerous polyps or small growths are   found. Skin Cancer  Check your skin from head to toe regularly.  Tell your health care provider about any new moles or changes in moles, especially if there is a change in a mole's shape or color.  Also tell your health care provider if you have a mole that is larger than the size of a pencil eraser.  Always use sunscreen. Apply sunscreen liberally and repeatedly throughout the day.  Protect yourself by wearing long sleeves, pants, a wide-brimmed hat, and sunglasses whenever you are outside. Heart disease, diabetes, and high blood pressure  High blood pressure causes heart disease and increases the risk of stroke. High blood pressure is more likely to develop in:  People who have blood pressure in the high end of the normal range (130-139/85-89 mm Hg).  People who are overweight or obese.  People who are African American.  If you are 18-39 years of age, have your blood pressure checked  every 3-5 years. If you are 40 years of age or older, have your blood pressure checked every year. You should have your blood pressure measured twice-once when you are at a hospital or clinic, and once when you are not at a hospital or clinic. Record the average of the two measurements. To check your blood pressure when you are not at a hospital or clinic, you can use:  An automated blood pressure machine at a pharmacy.  A home blood pressure monitor.  If you are between 55 years and 79 years old, ask your health care provider if you should take aspirin to prevent strokes.  Have regular diabetes screenings. This involves taking a blood sample to check your fasting blood sugar level.  If you are at a normal weight and have a low risk for diabetes, have this test once every three years after 75 years of age.  If you are overweight and have a high risk for diabetes, consider being tested at a younger age or more often. Preventing infection Hepatitis B  If you have a higher risk for hepatitis B, you should be screened for this virus. You are considered at high risk for hepatitis B if:  You were born in a country where hepatitis B is common. Ask your health care provider which countries are considered high risk.  Your parents were born in a high-risk country, and you have not been immunized against hepatitis B (hepatitis B vaccine).  You have HIV or AIDS.  You use needles to inject street drugs.  You live with someone who has hepatitis B.  You have had sex with someone who has hepatitis B.  You get hemodialysis treatment.  You take certain medicines for conditions, including cancer, organ transplantation, and autoimmune conditions. Hepatitis C  Blood testing is recommended for:  Everyone born from 1945 through 1965.  Anyone with known risk factors for hepatitis C. Sexually transmitted infections (STIs)  You should be screened for sexually transmitted infections (STIs) including  gonorrhea and chlamydia if:  You are sexually active and are younger than 75 years of age.  You are older than 75 years of age and your health care provider tells you that you are at risk for this type of infection.  Your sexual activity has changed since you were last screened and you are at an increased risk for chlamydia or gonorrhea. Ask your health care provider if you are at risk.  If you do not have HIV, but are at risk, it may be recommended that you take a   prescription medicine daily to prevent HIV infection. This is called pre-exposure prophylaxis (PrEP). You are considered at risk if:  You are sexually active and do not regularly use condoms or know the HIV status of your partner(s).  You take drugs by injection.  You are sexually active with a partner who has HIV. Talk with your health care provider about whether you are at high risk of being infected with HIV. If you choose to begin PrEP, you should first be tested for HIV. You should then be tested every 3 months for as long as you are taking PrEP. Pregnancy  If you are premenopausal and you may become pregnant, ask your health care provider about preconception counseling.  If you may become pregnant, take 400 to 800 micrograms (mcg) of folic acid every day.  If you want to prevent pregnancy, talk to your health care provider about birth control (contraception). Osteoporosis and menopause  Osteoporosis is a disease in which the bones lose minerals and strength with aging. This can result in serious bone fractures. Your risk for osteoporosis can be identified using a bone density scan.  If you are 65 years of age or older, or if you are at risk for osteoporosis and fractures, ask your health care provider if you should be screened.  Ask your health care provider whether you should take a calcium or vitamin D supplement to lower your risk for osteoporosis.  Menopause may have certain physical symptoms and risks.  Hormone  replacement therapy may reduce some of these symptoms and risks. Talk to your health care provider about whether hormone replacement therapy is right for you. Follow these instructions at home:  Schedule regular health, dental, and eye exams.  Stay current with your immunizations.  Do not use any tobacco products including cigarettes, chewing tobacco, or electronic cigarettes.  If you are pregnant, do not drink alcohol.  If you are breastfeeding, limit how much and how often you drink alcohol.  Limit alcohol intake to no more than 1 drink per day for nonpregnant women. One drink equals 12 ounces of beer, 5 ounces of wine, or 1 ounces of hard liquor.  Do not use street drugs.  Do not share needles.  Ask your health care provider for help if you need support or information about quitting drugs.  Tell your health care provider if you often feel depressed.  Tell your health care provider if you have ever been abused or do not feel safe at home. This information is not intended to replace advice given to you by your health care provider. Make sure you discuss any questions you have with your health care provider. Document Released: 02/17/2011 Document Revised: 01/10/2016 Document Reviewed: 05/08/2015  2017 Elsevier  

## 2016-09-12 LAB — CBC
HEMATOCRIT: 34.9 % (ref 34.0–46.6)
Hemoglobin: 12.4 g/dL (ref 11.1–15.9)
MCH: 32.5 pg (ref 26.6–33.0)
MCHC: 35.5 g/dL (ref 31.5–35.7)
MCV: 91 fL (ref 79–97)
Platelets: 288 10*3/uL (ref 150–379)
RBC: 3.82 x10E6/uL (ref 3.77–5.28)
RDW: 14.3 % (ref 12.3–15.4)
WBC: 6.6 10*3/uL (ref 3.4–10.8)

## 2016-09-12 LAB — TSH: TSH: 1.32 u[IU]/mL (ref 0.450–4.500)

## 2016-09-12 LAB — LIPID PANEL
CHOLESTEROL TOTAL: 185 mg/dL (ref 100–199)
Chol/HDL Ratio: 3.7 ratio units (ref 0.0–4.4)
HDL: 50 mg/dL (ref >39)
LDL CALC: 103 mg/dL — AB (ref 0–99)
Triglycerides: 158 mg/dL — ABNORMAL HIGH (ref 0–149)
VLDL Cholesterol Cal: 32 mg/dL (ref 5–40)

## 2016-09-12 LAB — COMPREHENSIVE METABOLIC PANEL
ALBUMIN: 4.6 g/dL (ref 3.5–4.8)
ALK PHOS: 52 IU/L (ref 39–117)
ALT: 12 IU/L (ref 0–32)
AST: 13 IU/L (ref 0–40)
Albumin/Globulin Ratio: 1.8 (ref 1.2–2.2)
BUN / CREAT RATIO: 22 (ref 12–28)
BUN: 19 mg/dL (ref 8–27)
Bilirubin Total: 0.3 mg/dL (ref 0.0–1.2)
CALCIUM: 9.8 mg/dL (ref 8.7–10.3)
CO2: 24 mmol/L (ref 18–29)
CREATININE: 0.87 mg/dL (ref 0.57–1.00)
Chloride: 98 mmol/L (ref 96–106)
GFR calc Af Amer: 76 mL/min/{1.73_m2} (ref >59)
GFR, EST NON AFRICAN AMERICAN: 66 mL/min/{1.73_m2} (ref >59)
GLUCOSE: 100 mg/dL — AB (ref 65–99)
Globulin, Total: 2.6 g/dL (ref 1.5–4.5)
Potassium: 4.8 mmol/L (ref 3.5–5.2)
Sodium: 140 mmol/L (ref 134–144)
TOTAL PROTEIN: 7.2 g/dL (ref 6.0–8.5)

## 2016-09-12 LAB — HEMOGLOBIN A1C
Est. average glucose Bld gHb Est-mCnc: 123 mg/dL
Hgb A1c MFr Bld: 5.9 % — ABNORMAL HIGH (ref 4.8–5.6)

## 2016-09-12 LAB — VITAMIN D 25 HYDROXY (VIT D DEFICIENCY, FRACTURES): VIT D 25 HYDROXY: 21.6 ng/mL — AB (ref 30.0–100.0)

## 2016-09-24 ENCOUNTER — Other Ambulatory Visit: Payer: Self-pay | Admitting: Family Medicine

## 2016-10-04 MED ORDER — PRAVASTATIN SODIUM 40 MG PO TABS: 40.0000 mg | ORAL_TABLET | Freq: Every day | ORAL | 3 refills | Status: DC

## 2016-10-08 ENCOUNTER — Ambulatory Visit (INDEPENDENT_AMBULATORY_CARE_PROVIDER_SITE_OTHER): Payer: Medicare Other

## 2016-10-08 ENCOUNTER — Ambulatory Visit (INDEPENDENT_AMBULATORY_CARE_PROVIDER_SITE_OTHER): Payer: Medicare Other | Admitting: Orthopedic Surgery

## 2016-10-08 ENCOUNTER — Encounter (INDEPENDENT_AMBULATORY_CARE_PROVIDER_SITE_OTHER): Payer: Self-pay | Admitting: Orthopedic Surgery

## 2016-10-08 DIAGNOSIS — G8929 Other chronic pain: Secondary | ICD-10-CM

## 2016-10-08 DIAGNOSIS — M25561 Pain in right knee: Secondary | ICD-10-CM

## 2016-10-08 DIAGNOSIS — M25562 Pain in left knee: Secondary | ICD-10-CM

## 2016-10-08 MED ORDER — LIDOCAINE HCL 1 % IJ SOLN
5.0000 mL | INTRAMUSCULAR | Status: AC | PRN
  Administered 2016-10-08: 5 mL

## 2016-10-08 MED ORDER — METHYLPREDNISOLONE ACETATE 40 MG/ML IJ SUSP
40.0000 mg | INTRAMUSCULAR | Status: AC | PRN
  Administered 2016-10-08: 40 mg via INTRA_ARTICULAR

## 2016-10-08 MED ORDER — BUPIVACAINE HCL 0.25 % IJ SOLN
4.0000 mL | INTRAMUSCULAR | Status: AC | PRN
  Administered 2016-10-08: 4 mL via INTRA_ARTICULAR
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
## 2016-11-05 ENCOUNTER — Ambulatory Visit (INDEPENDENT_AMBULATORY_CARE_PROVIDER_SITE_OTHER): Payer: Medicare Other | Admitting: Orthopedic Surgery

## 2016-11-10 ENCOUNTER — Telehealth (INDEPENDENT_AMBULATORY_CARE_PROVIDER_SITE_OTHER): Payer: Self-pay | Admitting: Orthopaedic Surgery

## 2016-11-10 ENCOUNTER — Encounter (INDEPENDENT_AMBULATORY_CARE_PROVIDER_SITE_OTHER): Payer: Self-pay | Admitting: Orthopedic Surgery

## 2016-11-10 ENCOUNTER — Ambulatory Visit (INDEPENDENT_AMBULATORY_CARE_PROVIDER_SITE_OTHER): Payer: Medicare Other | Admitting: Orthopedic Surgery

## 2016-11-10 DIAGNOSIS — M1711 Unilateral primary osteoarthritis, right knee: Secondary | ICD-10-CM

## 2016-11-10 DIAGNOSIS — M1712 Unilateral primary osteoarthritis, left knee: Secondary | ICD-10-CM

## 2016-11-10 DIAGNOSIS — M170 Bilateral primary osteoarthritis of knee: Secondary | ICD-10-CM

## 2016-11-10 MED ORDER — LIDOCAINE HCL 1 % IJ SOLN
5.0000 mL | INTRAMUSCULAR | Status: AC | PRN
Start: 2016-11-10 — End: 2016-11-10
  Administered 2016-11-10: 5 mL

## 2016-11-10 MED ORDER — HYALURONAN 88 MG/4ML IX SOSY
88.0000 mg | PREFILLED_SYRINGE | INTRA_ARTICULAR | Status: AC | PRN
  Administered 2016-11-10: 88 mg via INTRA_ARTICULAR

## 2016-11-10 MED ORDER — LIDOCAINE HCL 1 % IJ SOLN
5.0000 mL | INTRAMUSCULAR | Status: AC | PRN
  Administered 2016-11-10: 5 mL

## 2016-11-10 NOTE — Telephone Encounter (Signed)
Verbal left on VM 

## 2016-11-10 NOTE — Telephone Encounter (Signed)
Carol Tucker called needing verbal approval for PT 3 times a week for 2 weeks. CB # [id number]

## 2016-11-10 NOTE — Progress Notes (Signed)
   Procedure Note  Patient: Carol Tucker             Date of Birth: 11-27-1940           MRN: [id number]             Visit Date: 11/10/2016  Procedures: Visit Diagnoses: No diagnosis found.  Large Joint Inj Date/Time: 11/10/2016 9:27 PM Performed by: Meredith Pel Authorized by: Meredith Pel   Consent Given by:  Patient Site marked: the procedure site was marked   Timeout: prior to procedure the correct patient, procedure, and site was verified   Indications:  Pain, joint swelling and diagnostic evaluation Location:  Knee Site:  R knee Prep: patient was prepped and draped in usual sterile fashion   Needle Size:  18 G Needle Length:  1.5 inches Approach:  Superolateral Ultrasound Guidance: No   Fluoroscopic Guidance: No   Arthrogram: No   Medications:  5 mL lidocaine 1 %; 88 mg Hyaluronan 88 MG/4ML Patient tolerance:  Patient tolerated the procedure well with no immediate complications Large Joint Inj Date/Time: 11/10/2016 9:28 PM Performed by: Meredith Pel Authorized by: Meredith Pel   Consent Given by:  Patient Site marked: the procedure site was marked   Timeout: prior to procedure the correct patient, procedure, and site was verified   Indications:  Pain, joint swelling and diagnostic evaluation Location:  Knee Site:  L knee Prep: patient was prepped and draped in usual sterile fashion   Needle Size:  18 G Needle Length:  1.5 inches Approach:  Superolateral Ultrasound Guidance: No   Fluoroscopic Guidance: No   Arthrogram: No   Medications:  5 mL lidocaine 1 %; 88 mg Hyaluronan 88 MG/4ML Patient tolerance:  Patient tolerated the procedure well with no immediate complications

## 2017-02-05 ENCOUNTER — Encounter: Payer: Self-pay | Admitting: Family Medicine

## 2017-02-10 ENCOUNTER — Ambulatory Visit: Payer: Medicare Other | Admitting: Family Medicine

## 2017-03-10 DIAGNOSIS — E559 Vitamin D deficiency, unspecified: Secondary | ICD-10-CM | POA: Insufficient documentation

## 2017-03-10 NOTE — Progress Notes (Signed)
Subjective:    Patient ID: Carol Tucker, female    DOB: Mar 31, 1941, 76 y.o.   MRN: [id number]   Chief Complaint  Patient presents with  . Follow-up    6 month f/u from AWV     HPI  Carol Tucker is a delightful 76 yo woman who is here today for a 6 mo follow-up on her below chronic medical conditions. Currently she is preparing to move to [place] where grandson lives and daughter and son-in-law think they husband will move towards after they retire in several years.  Her house in closing in several d and moving this weekend. Closing on her new Person the next d.  Guesses she'll have to transfer her care there at some point so likely our last visit.   1. HTN: Controlled on lisinopril-hctz 10-12.5 - checks BP at home. Was seen 06/2016 by Dr. Acie Fredrickson, cardiology, as she had been happening episodes of nonexertional atypical chest pain. Fortunately cardiology was able to reassure her that these are noncardiac and she has no restrictions to exercise. She did stop her steroid eyedrops for cataracts and felt that her symptoms were improving. Had a nml chemical stress test in 2010. 2.  Osteopenia: On Ca/Vit D supp gummy. DEXA bone scan 03/2016 had worsened a little since 2015 so she was advised to increase her weight bearing exercise and we will recheck in 2 years.  2.5  Vit D deficiency: Take D3 supp from [employer] but unknown milligreams. Lab Results  Component Value Date   VD25OH 21.6 (L) 09/11/2016   VD25OH 25 (L) 12/20/2015   VD25OH 45 01/20/2014    3. HPL: on pravastatin 40, tolerating well. 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 Lab Results  Component Value Date   LDLCALC 103 (H) 09/11/2016   LDLCALC 90 12/20/2015   LDLCALC 157 (H) 06/21/2015    4.  Pre-DM borderline: really limited white carbs  Lab Results  Component Value  Date   HGBA1C 5.9 (H) 09/11/2016   HGBA1C 6.1 (H) 12/20/2015   HGBA1C 5.7 11/14/2014    5.  Microscopic hematuria- was intermittent for years, never saw urology, no change in urine.  6.  Cataracts s/p surgery - followed by Dr. Katy Fitch, on steroid eye drops. Seen annually around Oct 7.  Left lower varicose veins - going to have surg at [facility]? 8.   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 9.  GERD - per pepcid vs otc omeprazole? 10.  Anxiety with rare panic attacks- prn alprazolam 0.5 mg tid, uses very rarely. 11.  Right knee OA and pain - saw Dr. Marlou Sa with ortho, completed PT, completed viscous gel injections and are better  Past Medical History:  Diagnosis Date  . Allergy   . Anxiety   . Cancer ([place])   . Depression   . GERD (gastroesophageal reflux disease)   . Hyperlipidemia   . Hypertension   . Osteopenia 8-04   DEXA at [facility] 04/14/14 with lowest Tscore -1.7 at Left femoral neck. FRAX does not indicate need for bisphosphonate but density has sig worsened since prior dexa on 12/17/2009  . Prediabetes    Past Surgical History:  Procedure Laterality Date  . APPENDECTOMY    .  DILATION AND CURETTAGE OF UTERUS  1969   MISSED AB  . KNEE ARTHROSCOPY  2007  . 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 TUBAL LIGATION  1980  . tumor removal salivary gland     Current Outpatient Prescriptions on File Prior to Visit  Medication Sig Dispense Refill  . ALPRAZolam (XANAX) 0.5 MG tablet Take 1 tablet (0.5 mg total) by mouth 3 (three) times daily as needed for anxiety (chest pain). 30 tablet 0  . aspirin EC 81 MG tablet Take 81 mg by mouth daily.    . calcium carbonate (OS-CAL) 600 MG TABS tablet Take 600 mg by mouth 2 (two) times daily with a meal.    . cholecalciferol (VITAMIN D) 1000 UNITS tablet Take 1,000 Units by mouth 2 (two) times daily.    Marland Kitchen lisinopril-hydrochlorothiazide  (PRINZIDE,ZESTORETIC) 10-12.5 MG tablet TAKE 1 TABLET BY MOUTH DAILY. 30 tablet 5  . Multiple Vitamins-Minerals (VISION-VITE PRESERVE PO) Take 1 tablet by mouth daily.     . pravastatin (PRAVACHOL) 40 MG tablet Take 1 tablet (40 mg total) by mouth daily. 90 tablet 3   No current facility-administered medications on file prior to visit.    Allergies  Allergen Reactions  . Metoclopramide Hcl     REACTION: tremors, clinched jaws     REGLAN   Family History  Problem Relation Age of Onset  . Hypertension Father   . Heart disease Father   . Cancer Father        BLADDER & LUNG  . Heart disease Paternal Grandmother   . Alzheimer's disease Mother   . Heart disease Brother   . Heart disease Paternal Grandfather   . Diabetes Paternal Grandfather   . Colon cancer Neg Hx   . Pancreatic cancer Neg Hx   . Stomach cancer Neg Hx    Social History   Social History  . Marital status: Divorced    Spouse name: N/A  . Number of children: N/A  . Years of education: N/A   Social History Main Topics  . Smoking status: Former Research scientist (life sciences)  . Smokeless tobacco: Never Used  . Alcohol use Yes     Comment: Rare  . Drug use: No  . Sexual activity: No   Other Topics Concern  . None   Social History Narrative  . None   Depression screen [facility] 2/9 03/12/2017 09/11/2016 06/11/2016 06/04/2016 12/20/2015  Decreased Interest 0 0 0 0 0  Down, Depressed, Hopeless 0 0 0 0 0  PHQ - 2 Score 0 0 0 0 0    Review of Systems  Constitutional: Positive for fatigue. Negative for appetite change, chills, diaphoresis and fever.  Eyes: Negative for visual disturbance.  Respiratory: Negative for cough and shortness of breath.   Cardiovascular: Negative for chest pain, palpitations and leg swelling.  Genitourinary: Negative for decreased urine volume.  Musculoskeletal: Positive for arthralgias.  Neurological: Negative for syncope and headaches.  Hematological: Does not bruise/bleed easily.       Objective:    Physical Exam  Constitutional: She is oriented to person, place, and time. She appears well-developed and well-nourished. No distress.  HENT:  Head: Normocephalic and atraumatic.  Right Ear: External ear normal.  Left Ear: External ear normal.  Eyes: Conjunctivae are normal. No scleral icterus.  Neck: Normal range of motion. Neck supple. No thyromegaly present.  Cardiovascular: Normal rate, regular rhythm, normal heart sounds and intact distal pulses.   Pulmonary/Chest: Effort normal and breath sounds normal. No  respiratory distress.  Musculoskeletal: She exhibits no edema.  Lymphadenopathy:    She has no cervical adenopathy.  Neurological: She is alert and oriented to person, place, and time.  Skin: Skin is warm and dry. She is not diaphoretic. No erythema.  Psychiatric: She has a normal mood and affect. Her behavior is normal.      BP (!) 110/58   Pulse 62   Temp (!) 97.5 F (36.4 C) (Oral)   Resp 18   Ht 5\' 3"  (1.6 m)   Wt 145 lb 12.8 oz (66.1 kg)   SpO2 99%   BMI 25.83 kg/m      Assessment & Plan:  Will be due for AWV in 6 mos - establish with new provider in [place] for this.  Had Zostavax around 2014 but continues to experience what she thinks are small outbreaks of shingles. Just had another one last month and 1 another year prior. Recommend Shingrex vaccine - discuss at next OV.  Receives all chronic meds from [facility] - Provide one year of refills whenever requested. Patient will call us with name of her insurance preferred mail order pharmacy or new pharmacy in [place] so I know where to send refills.   1. Essential hypertension - stable, cont curreng regimen.  2. Hyperlipidemia, unspecified hyperlipidemia type - wants to stay on statin due to optho findings  3. Prediabetes - improving  4. Microscopic hematuria   5. Osteopenia, unspecified location   6. Vitamin D deficiency - level nml, cont current otc supplement  7. Right ear impacted cerumen - failed  curette, partially removed by lavage by CMA    Orders Placed This Encounter  Procedures  . Comprehensive metabolic panel    Order Specific Question:   Has the patient fasted?    Answer:   Yes  . Lipid panel    Order Specific Question:   Has the patient fasted?    Answer:   Yes  . VITAMIN D 25 Hydroxy (Vit-D Deficiency, Fractures)  . Hemoglobin A1c  . Ear wax removal  . Care order/instruction:    Scheduling Instructions:     Complete orders, AVS and go.  Marland Kitchen POCT urinalysis dipstick    Meds ordered this encounter  Medications  . famotidine (PEPCID) 20 MG tablet    Sig: Take 1 tablet (20 mg total) by mouth 2 (two) times daily.    Dispense:  180 tablet    Refill:  3      Delman Cheadle, M.D.  Primary Care at [facility] [address] [place], [place] [address] [phone] phone [phone] fax  03/14/17 7:49 PM   Results for orders placed or performed in visit on 03/12/17  Comprehensive metabolic panel  Result Value Ref Range   Glucose 102 (H) 65 - 99 mg/dL   BUN 20 8 - 27 mg/dL   Creatinine, Ser 0.85 0.57 - 1.00 mg/dL   GFR calc non Af Amer 68 >59 mL/min/1.73   GFR calc Af Amer 78 >59 mL/min/1.73   BUN/Creatinine Ratio 24 12 - 28   Sodium 140 134 - 144 mmol/L   Potassium 4.4 3.5 - 5.2 mmol/L   Chloride 102 96 - 106 mmol/L   CO2 21 20 - 29 mmol/L   Calcium 9.5 8.7 - 10.3 mg/dL   Total Protein 7.5 6.0 - 8.5 g/dL   Albumin 4.7 3.5 - 4.8 g/dL   Globulin, Total 2.8 1.5 - 4.5 g/dL   Albumin/Globulin Ratio 1.7 1.2 - 2.2  Bilirubin Total 0.4 0.0 - 1.2 mg/dL   Alkaline Phosphatase 55 39 - 117 IU/L   AST 19 0 - 40 IU/L   ALT 13 0 - 32 IU/L  Lipid panel  Result Value Ref Range   Cholesterol, Total 179 100 - 199 mg/dL   Triglycerides 119 0 - 149 mg/dL   HDL 46 >39 mg/dL   VLDL Cholesterol Cal 24 5 - 40 mg/dL   LDL Calculated 109 (H) 0 - 99 mg/dL   Chol/HDL Ratio 3.9 0.0 - 4.4 ratio  VITAMIN D 25 Hydroxy (Vit-D Deficiency, Fractures)  Result Value Ref  Range   Vit D, 25-Hydroxy 35.5 30.0 - 100.0 ng/mL  Hemoglobin A1c  Result Value Ref Range   Hgb A1c MFr Bld 5.8 (H) 4.8 - 5.6 %   Est. average glucose Bld gHb Est-mCnc 120 mg/dL  POCT urinalysis dipstick  Result Value Ref Range   Color, UA yellow yellow   Clarity, UA clear clear   Glucose, UA negative negative mg/dL   Bilirubin, UA negative negative   Ketones, POC UA negative negative mg/dL   Spec Grav, UA 1.010 1.010 - 1.025   Blood, UA trace-intact (A) negative   pH, UA 5.5 5.0 - 8.0   Protein Ur, POC negative negative mg/dL   Urobilinogen, UA 0.2 0.2 or 1.0 E.U./dL   Nitrite, UA Negative Negative   Leukocytes, UA Negative Negative

## 2017-03-12 ENCOUNTER — Ambulatory Visit (INDEPENDENT_AMBULATORY_CARE_PROVIDER_SITE_OTHER): Payer: Medicare Other | Admitting: Family Medicine

## 2017-03-12 ENCOUNTER — Encounter: Payer: Self-pay | Admitting: Family Medicine

## 2017-03-12 VITALS — BP 110/58 | HR 62 | Temp 97.5°F | Resp 18 | Ht 63.0 in | Wt 145.8 lb

## 2017-03-12 DIAGNOSIS — E785 Hyperlipidemia, unspecified: Secondary | ICD-10-CM

## 2017-03-12 DIAGNOSIS — R7303 Prediabetes: Secondary | ICD-10-CM

## 2017-03-12 DIAGNOSIS — H6121 Impacted cerumen, right ear: Secondary | ICD-10-CM

## 2017-03-12 DIAGNOSIS — M8580 Other specified disorders of bone density and structure, unspecified site: Secondary | ICD-10-CM | POA: Diagnosis not present

## 2017-03-12 DIAGNOSIS — I10 Essential (primary) hypertension: Secondary | ICD-10-CM | POA: Diagnosis not present

## 2017-03-12 DIAGNOSIS — E559 Vitamin D deficiency, unspecified: Secondary | ICD-10-CM | POA: Diagnosis not present

## 2017-03-12 DIAGNOSIS — R3129 Other microscopic hematuria: Secondary | ICD-10-CM

## 2017-03-12 LAB — POCT URINALYSIS DIP (MANUAL ENTRY)
BILIRUBIN UA: NEGATIVE mg/dL
Bilirubin, UA: NEGATIVE
GLUCOSE UA: NEGATIVE mg/dL
Leukocytes, UA: NEGATIVE
Nitrite, UA: NEGATIVE
Protein Ur, POC: NEGATIVE mg/dL
SPEC GRAV UA: 1.01 (ref 1.010–1.025)
Urobilinogen, UA: 0.2 E.U./dL
pH, UA: 5.5 (ref 5.0–8.0)

## 2017-03-12 MED ORDER — FAMOTIDINE 20 MG PO TABS: 20.0000 mg | ORAL_TABLET | Freq: Two times a day (BID) | ORAL | 3 refills | Status: DC

## 2017-03-12 NOTE — Patient Instructions (Signed)
     IF you received an x-ray today, you will receive an invoice from [facility]. Please contact [facility] at [phone] with questions or concerns regarding your invoice.   IF you received labwork today, you will receive an invoice from [facility]. Please contact [facility] at [phone] with questions or concerns regarding your invoice.   Our billing staff will not be able to assist you with questions regarding bills from these companies.  You will be contacted with the lab results as soon as they are available. The fastest way to get your results is to activate your My Chart account. Instructions are located on the last page of this paperwork. If you have not heard from us regarding the results in 2 weeks, please contact this office.     

## 2017-03-13 LAB — COMPREHENSIVE METABOLIC PANEL
ALT: 13 IU/L (ref 0–32)
AST: 19 IU/L (ref 0–40)
Albumin/Globulin Ratio: 1.7 (ref 1.2–2.2)
Albumin: 4.7 g/dL (ref 3.5–4.8)
Alkaline Phosphatase: 55 IU/L (ref 39–117)
BUN/Creatinine Ratio: 24 (ref 12–28)
BUN: 20 mg/dL (ref 8–27)
Bilirubin Total: 0.4 mg/dL (ref 0.0–1.2)
CALCIUM: 9.5 mg/dL (ref 8.7–10.3)
CO2: 21 mmol/L (ref 20–29)
CREATININE: 0.85 mg/dL (ref 0.57–1.00)
Chloride: 102 mmol/L (ref 96–106)
GFR calc Af Amer: 78 mL/min/{1.73_m2} (ref >59)
GFR, EST NON AFRICAN AMERICAN: 68 mL/min/{1.73_m2} (ref >59)
Globulin, Total: 2.8 g/dL (ref 1.5–4.5)
Glucose: 102 mg/dL — ABNORMAL HIGH (ref 65–99)
Potassium: 4.4 mmol/L (ref 3.5–5.2)
Sodium: 140 mmol/L (ref 134–144)
Total Protein: 7.5 g/dL (ref 6.0–8.5)

## 2017-03-13 LAB — LIPID PANEL
CHOL/HDL RATIO: 3.9 ratio (ref 0.0–4.4)
Cholesterol, Total: 179 mg/dL (ref 100–199)
HDL: 46 mg/dL (ref >39)
LDL CALC: 109 mg/dL — AB (ref 0–99)
TRIGLYCERIDES: 119 mg/dL (ref 0–149)
VLDL CHOLESTEROL CAL: 24 mg/dL (ref 5–40)

## 2017-03-13 LAB — VITAMIN D 25 HYDROXY (VIT D DEFICIENCY, FRACTURES): Vit D, 25-Hydroxy: 35.5 ng/mL (ref 30.0–100.0)

## 2017-03-13 LAB — HEMOGLOBIN A1C
Est. average glucose Bld gHb Est-mCnc: 120 mg/dL
Hgb A1c MFr Bld: 5.8 % — ABNORMAL HIGH (ref 4.8–5.6)

## 2017-05-09 ENCOUNTER — Ambulatory Visit (INDEPENDENT_AMBULATORY_CARE_PROVIDER_SITE_OTHER): Payer: Medicare Other

## 2017-05-09 ENCOUNTER — Ambulatory Visit (INDEPENDENT_AMBULATORY_CARE_PROVIDER_SITE_OTHER): Payer: Medicare Other | Admitting: Family Medicine

## 2017-05-09 ENCOUNTER — Encounter: Payer: Self-pay | Admitting: Family Medicine

## 2017-05-09 VITALS — BP 141/69 | HR 66 | Temp 98.5°F | Resp 16 | Ht 63.0 in | Wt 146.4 lb

## 2017-05-09 DIAGNOSIS — S76302A Unspecified injury of muscle, fascia and tendon of the posterior muscle group at thigh level, left thigh, initial encounter: Secondary | ICD-10-CM | POA: Diagnosis not present

## 2017-05-09 DIAGNOSIS — M79604 Pain in right leg: Secondary | ICD-10-CM | POA: Diagnosis not present

## 2017-05-09 DIAGNOSIS — R52 Pain, unspecified: Secondary | ICD-10-CM

## 2017-05-09 IMAGING — DX DG HIP (WITH OR WITHOUT PELVIS) 2-3V*R*
3 series · 3 of 3 positions shown · non-contrast
Comparison: None.

CLINICAL DATA: Acute right leg pain nocturnal pain

EXAM:
DG HIP (WITH OR WITHOUT PELVIS) 2-3V RIGHT

[pelvis ap]
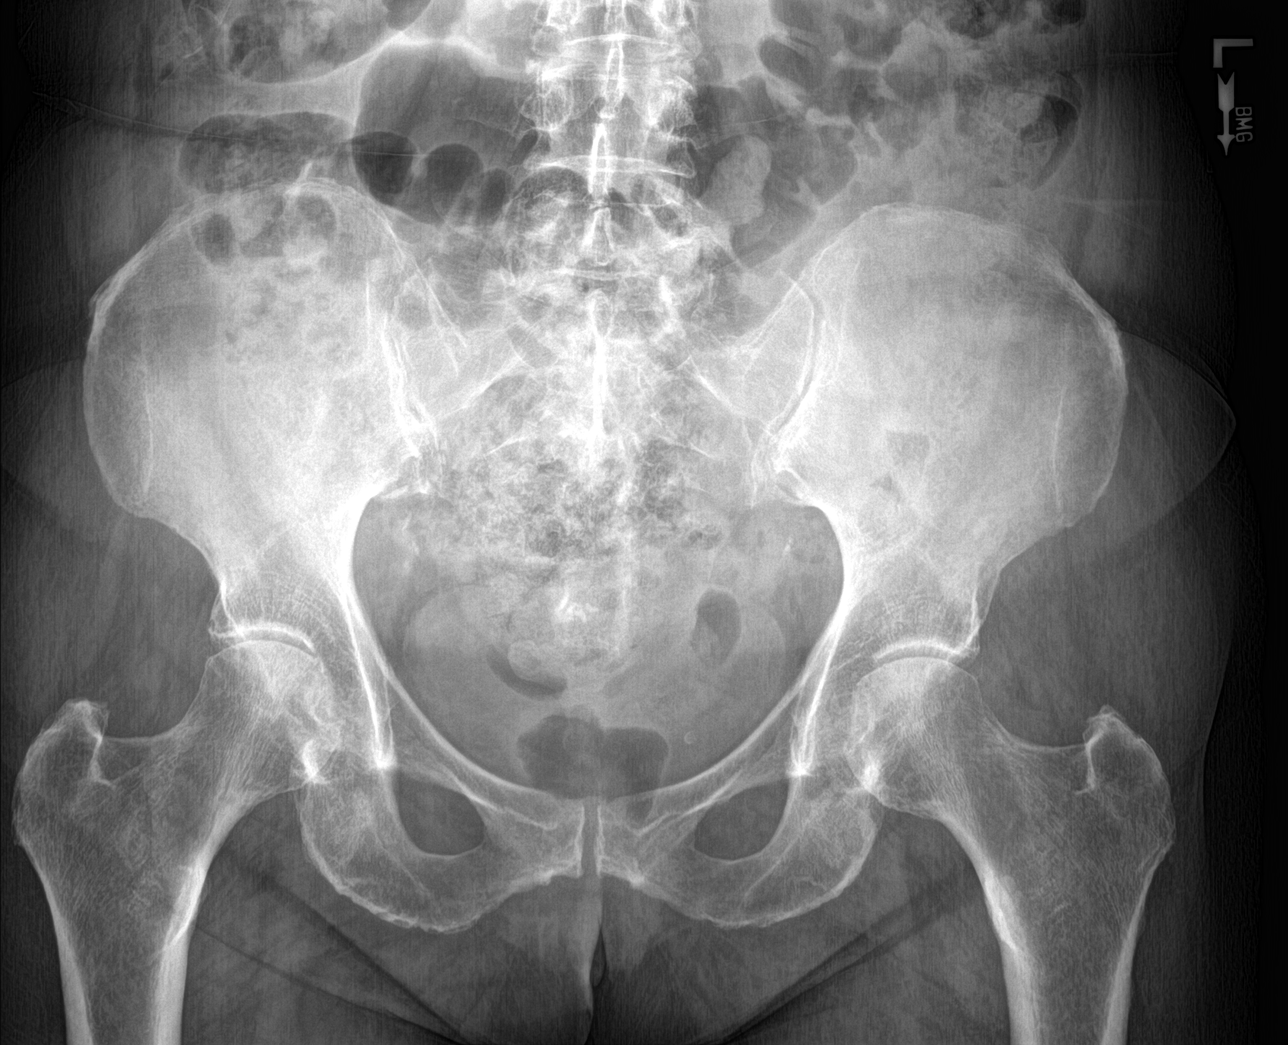

[hip ap]
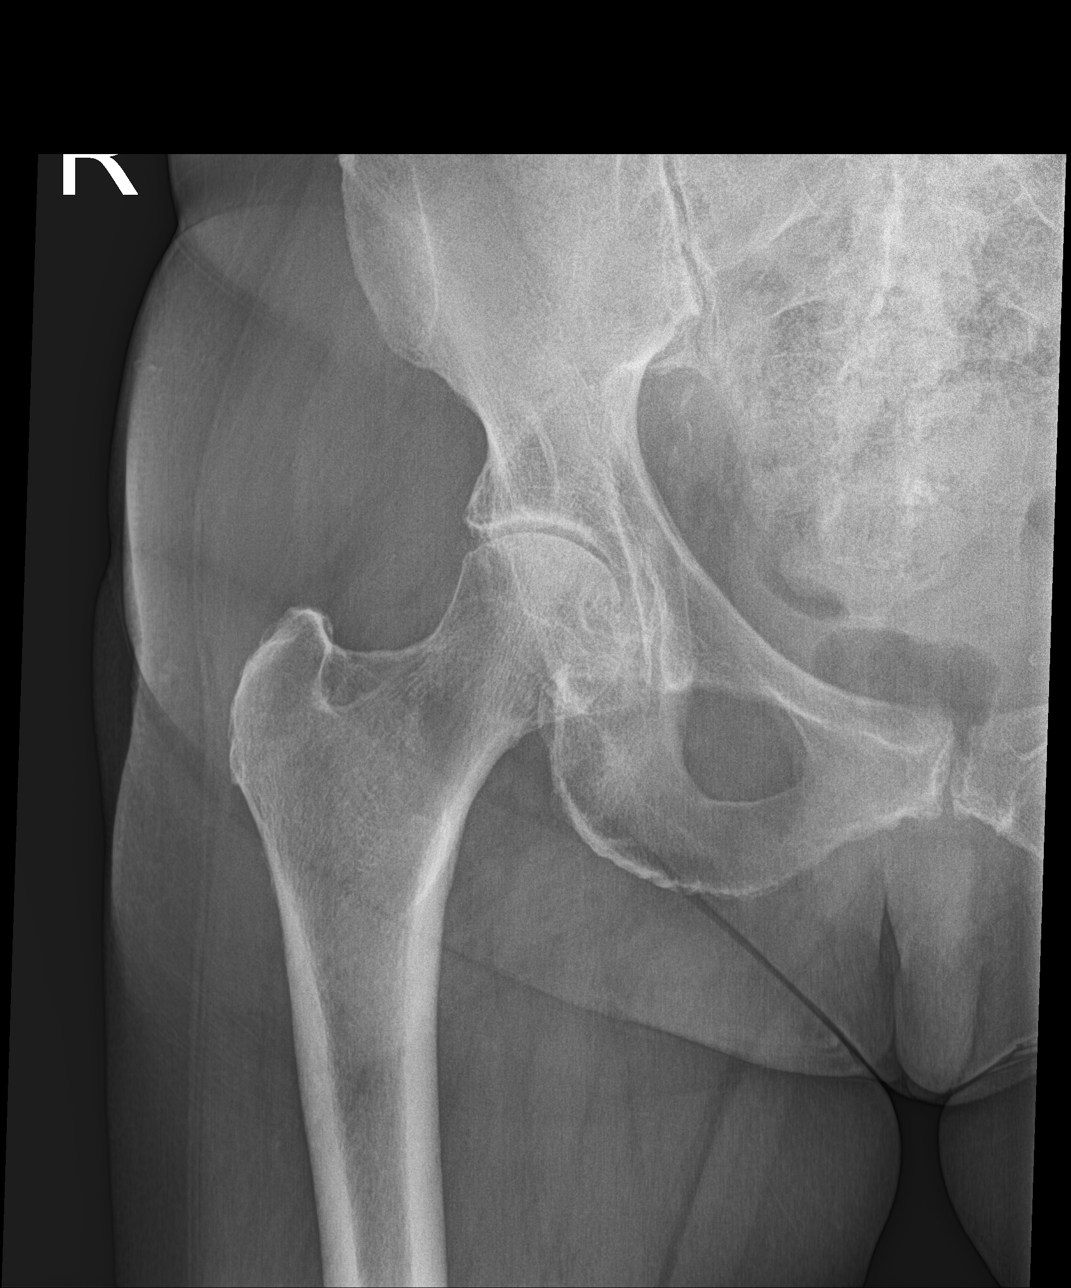

[hip lat]
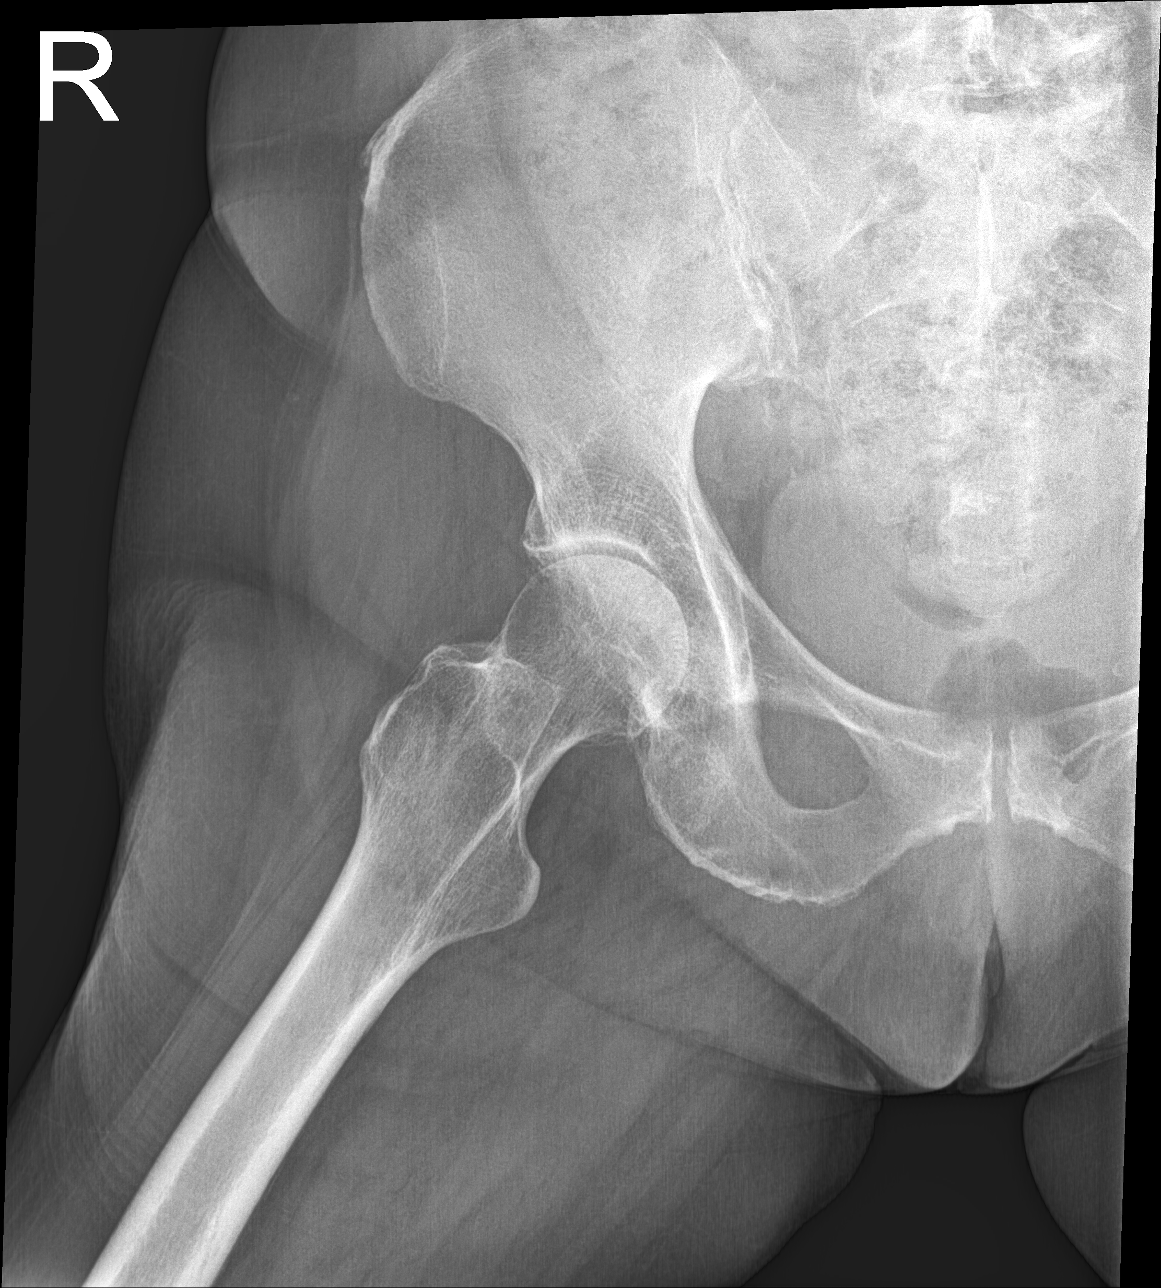

[3 of 3 positions shown; findings below may reference images not displayed]

FINDINGS: There is no evidence of hip fracture or dislocation. There is no
evidence of arthropathy or other focal bone abnormality.
IMPRESSION: Negative.

## 2017-05-09 IMAGING — DX DG FEMUR 2+V*R*
4 series · 4 of 4 positions shown · non-contrast
Comparison: None.

CLINICAL DATA: Right leg pain

EXAM:
RIGHT FEMUR 2 VIEWS

[femur ap (1 of 2)]
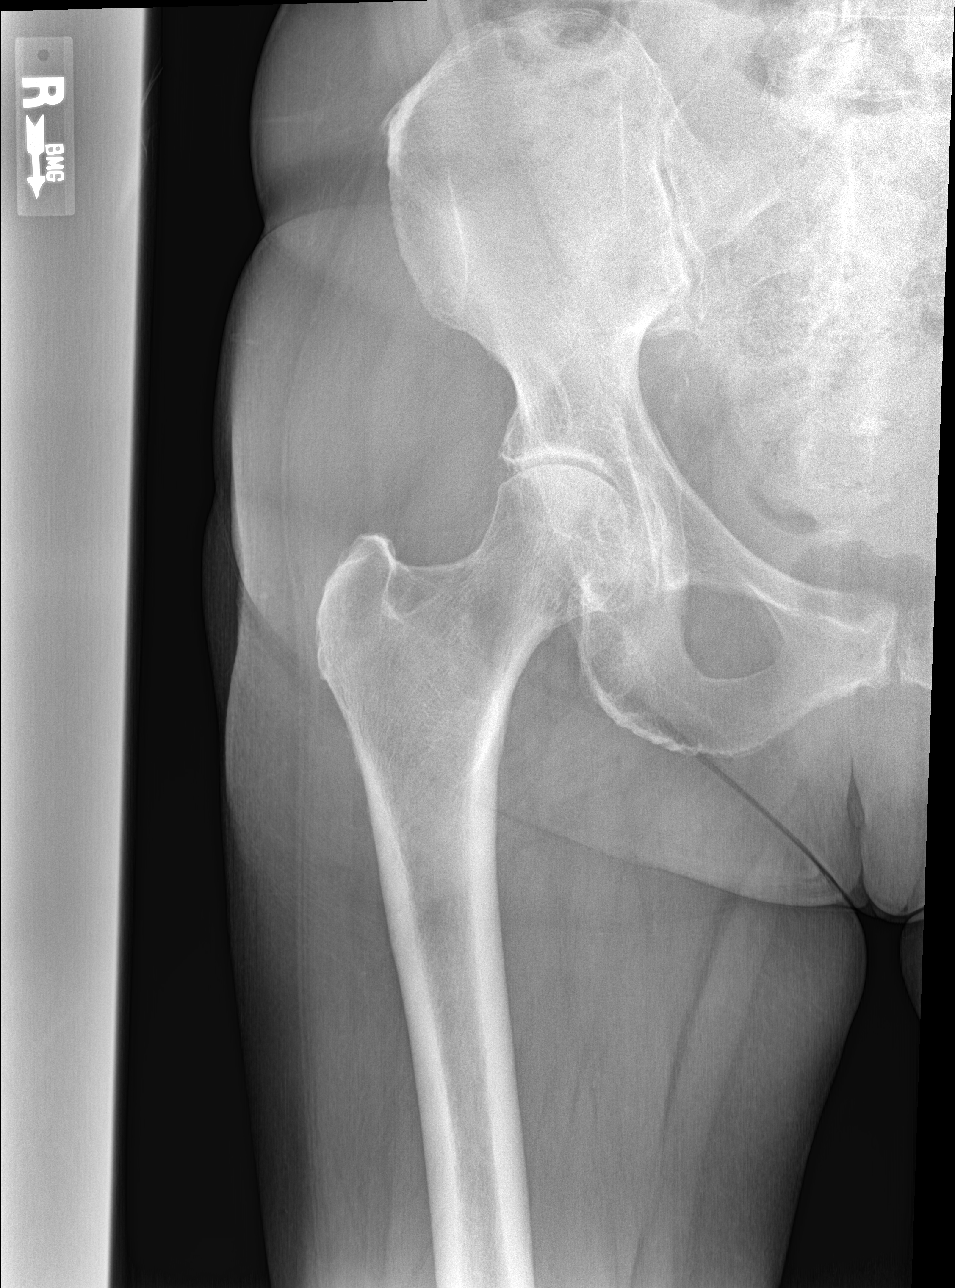

[femur ap (2 of 2)]
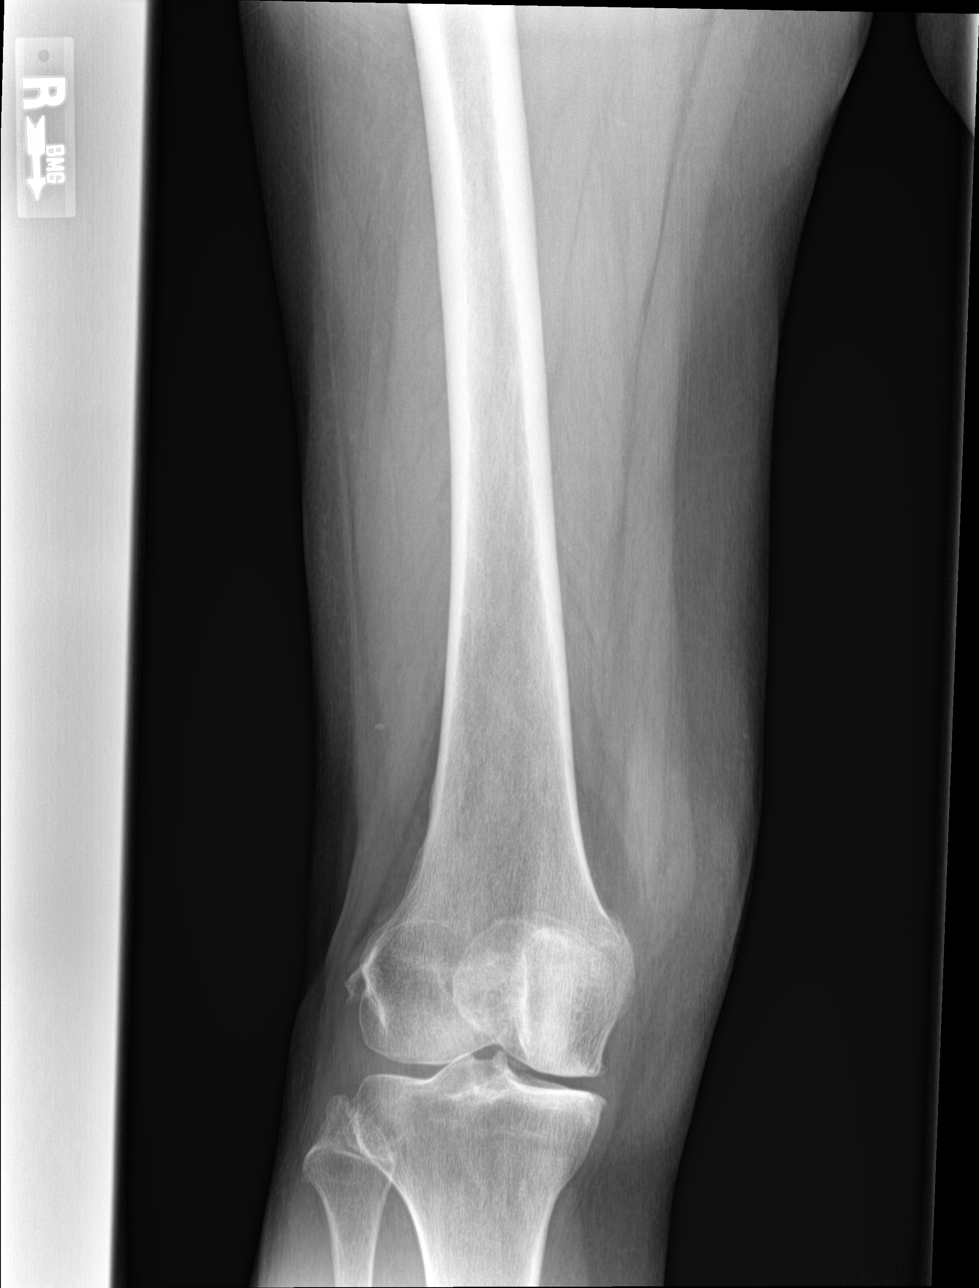

[femur lat (1 of 2)]
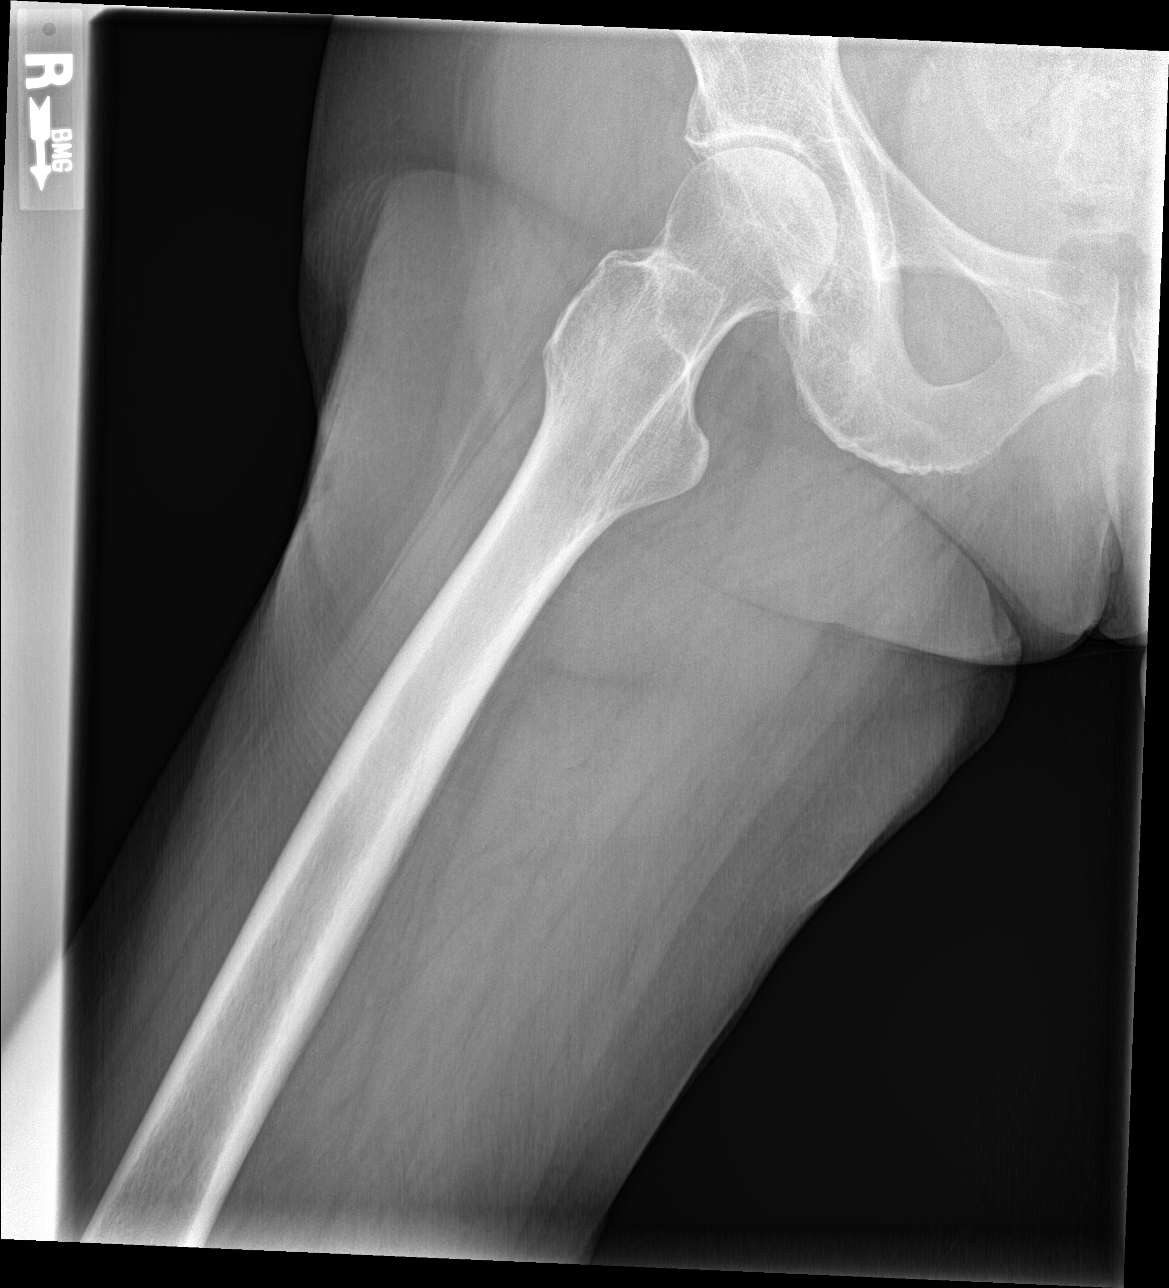

[femur lat (2 of 2)]
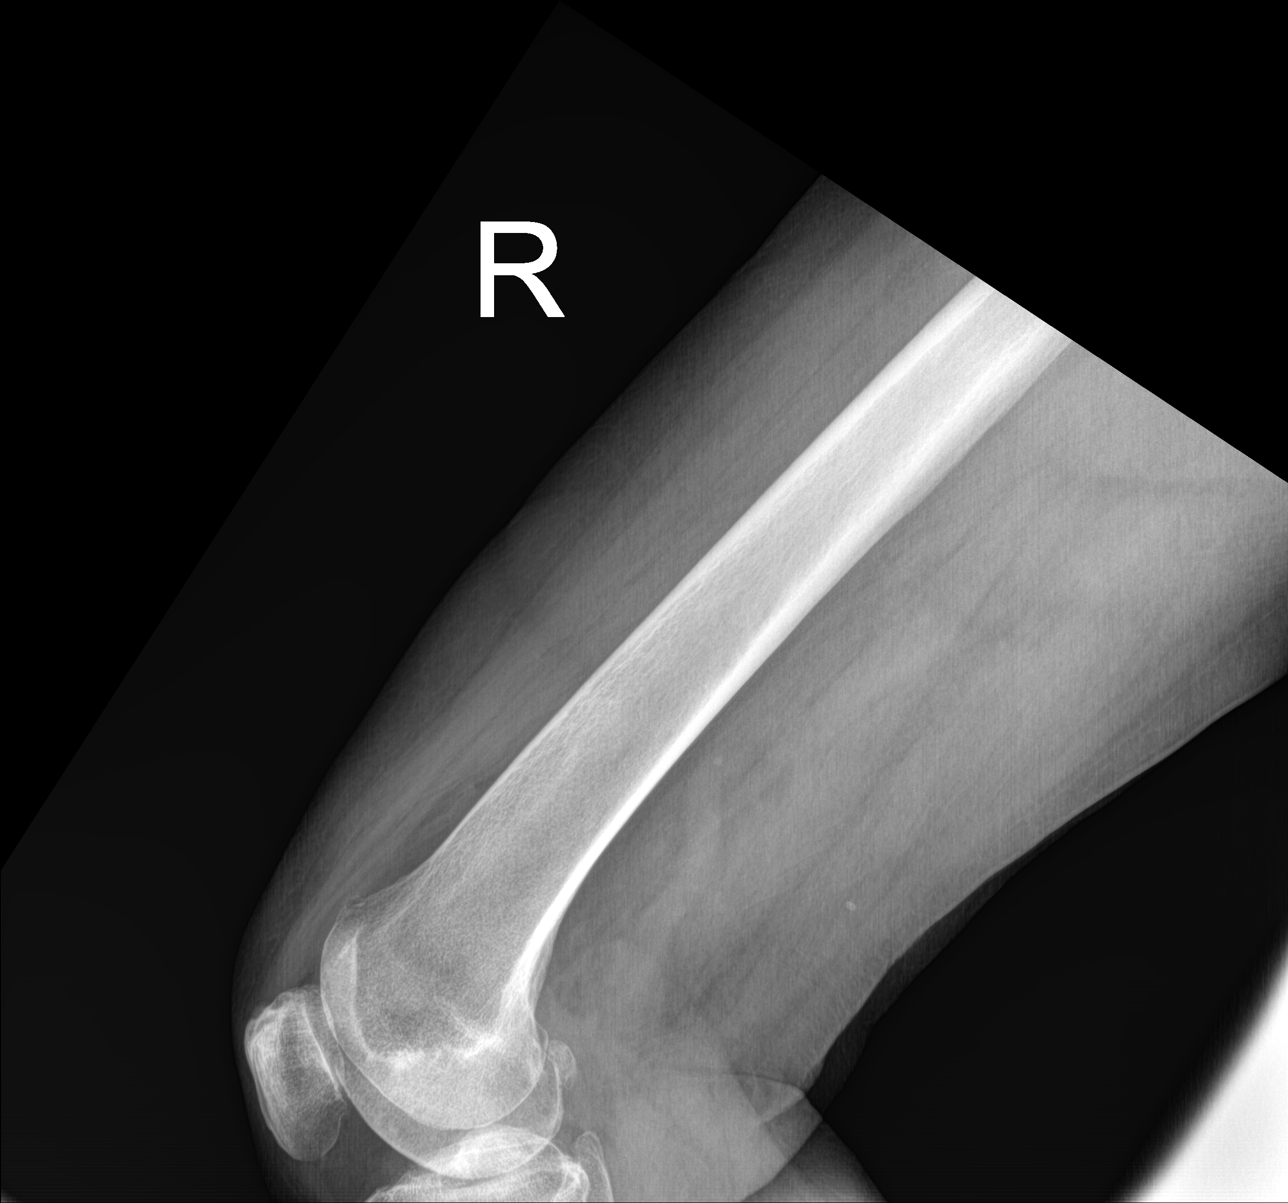

[4 of 4 positions shown; findings below may reference images not displayed]

FINDINGS: There is no evidence of fracture or other focal bone lesions. Soft
tissues are unremarkable.
IMPRESSION: Negative.

## 2017-05-09 IMAGING — DX DG KNEE 1-2V*R*
2 series · 2 of 2 positions shown · non-contrast
Comparison: [DATE]

CLINICAL DATA: Acute right leg pain.

EXAM:
RIGHT KNEE - 1-2 VIEW

[knee ap]
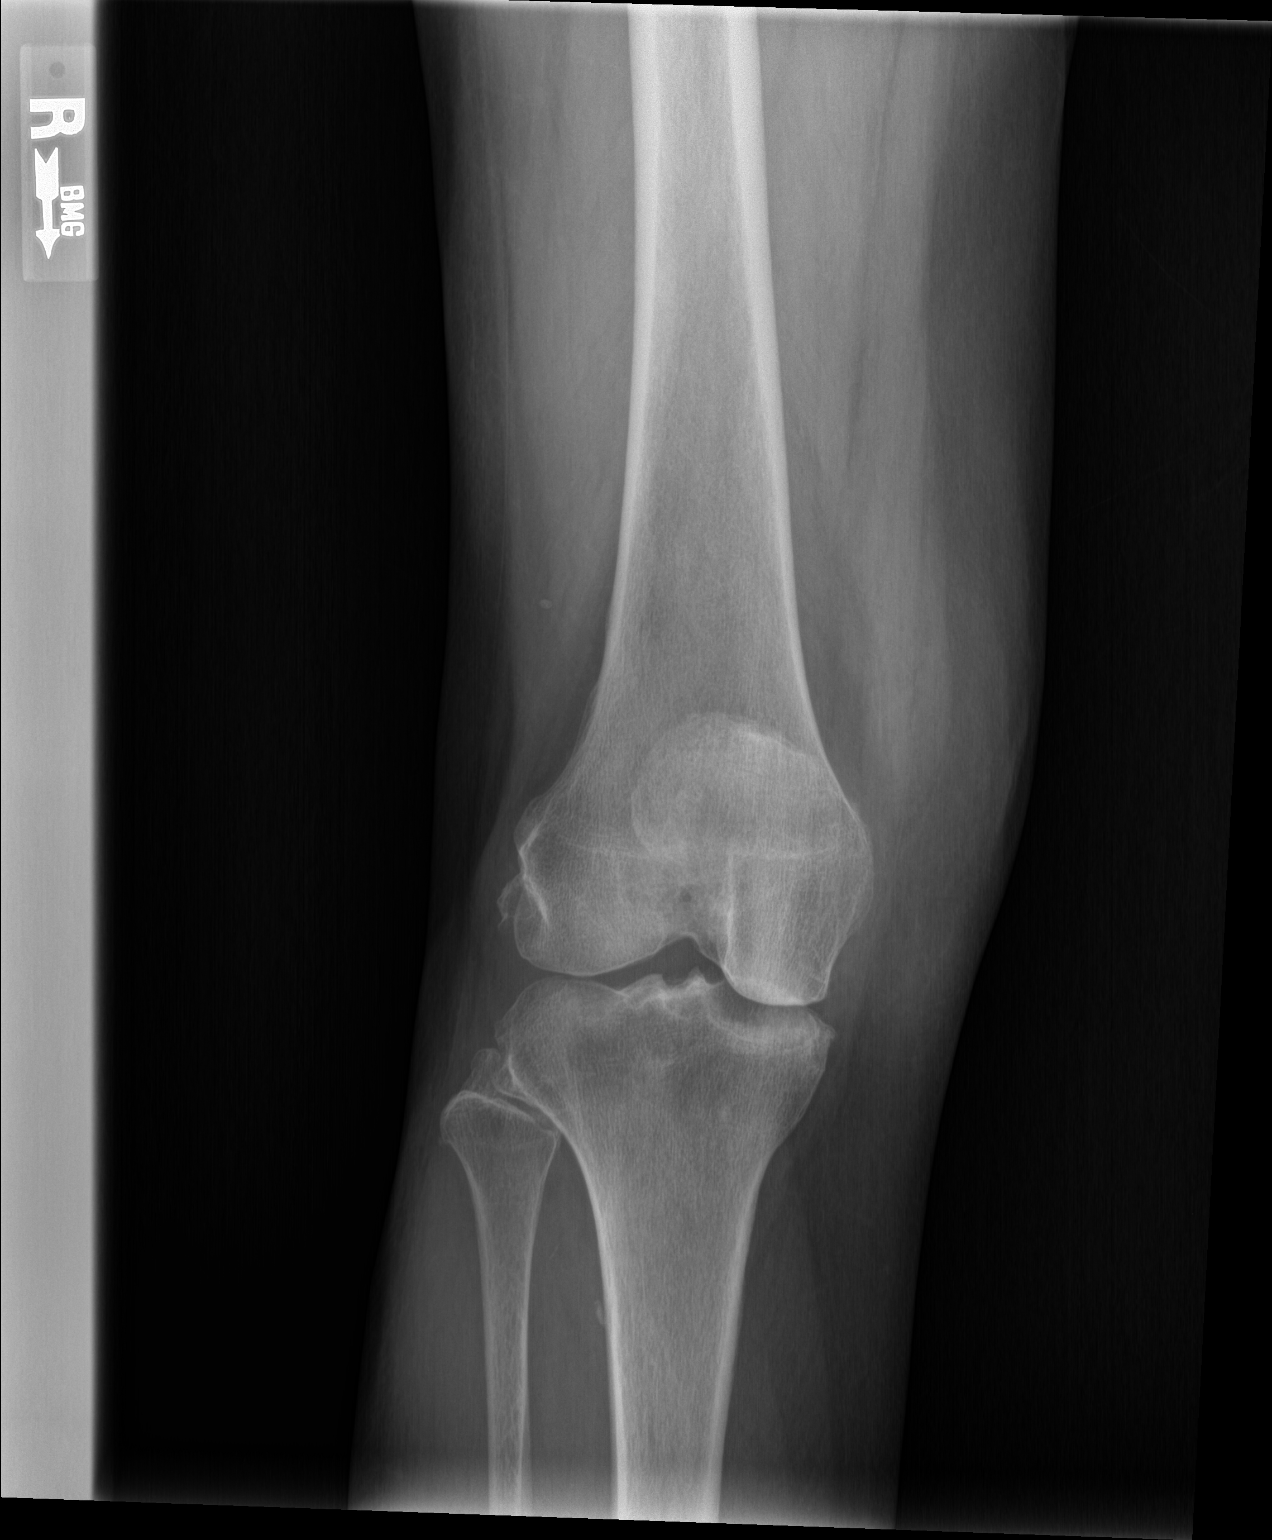

[knee lat]
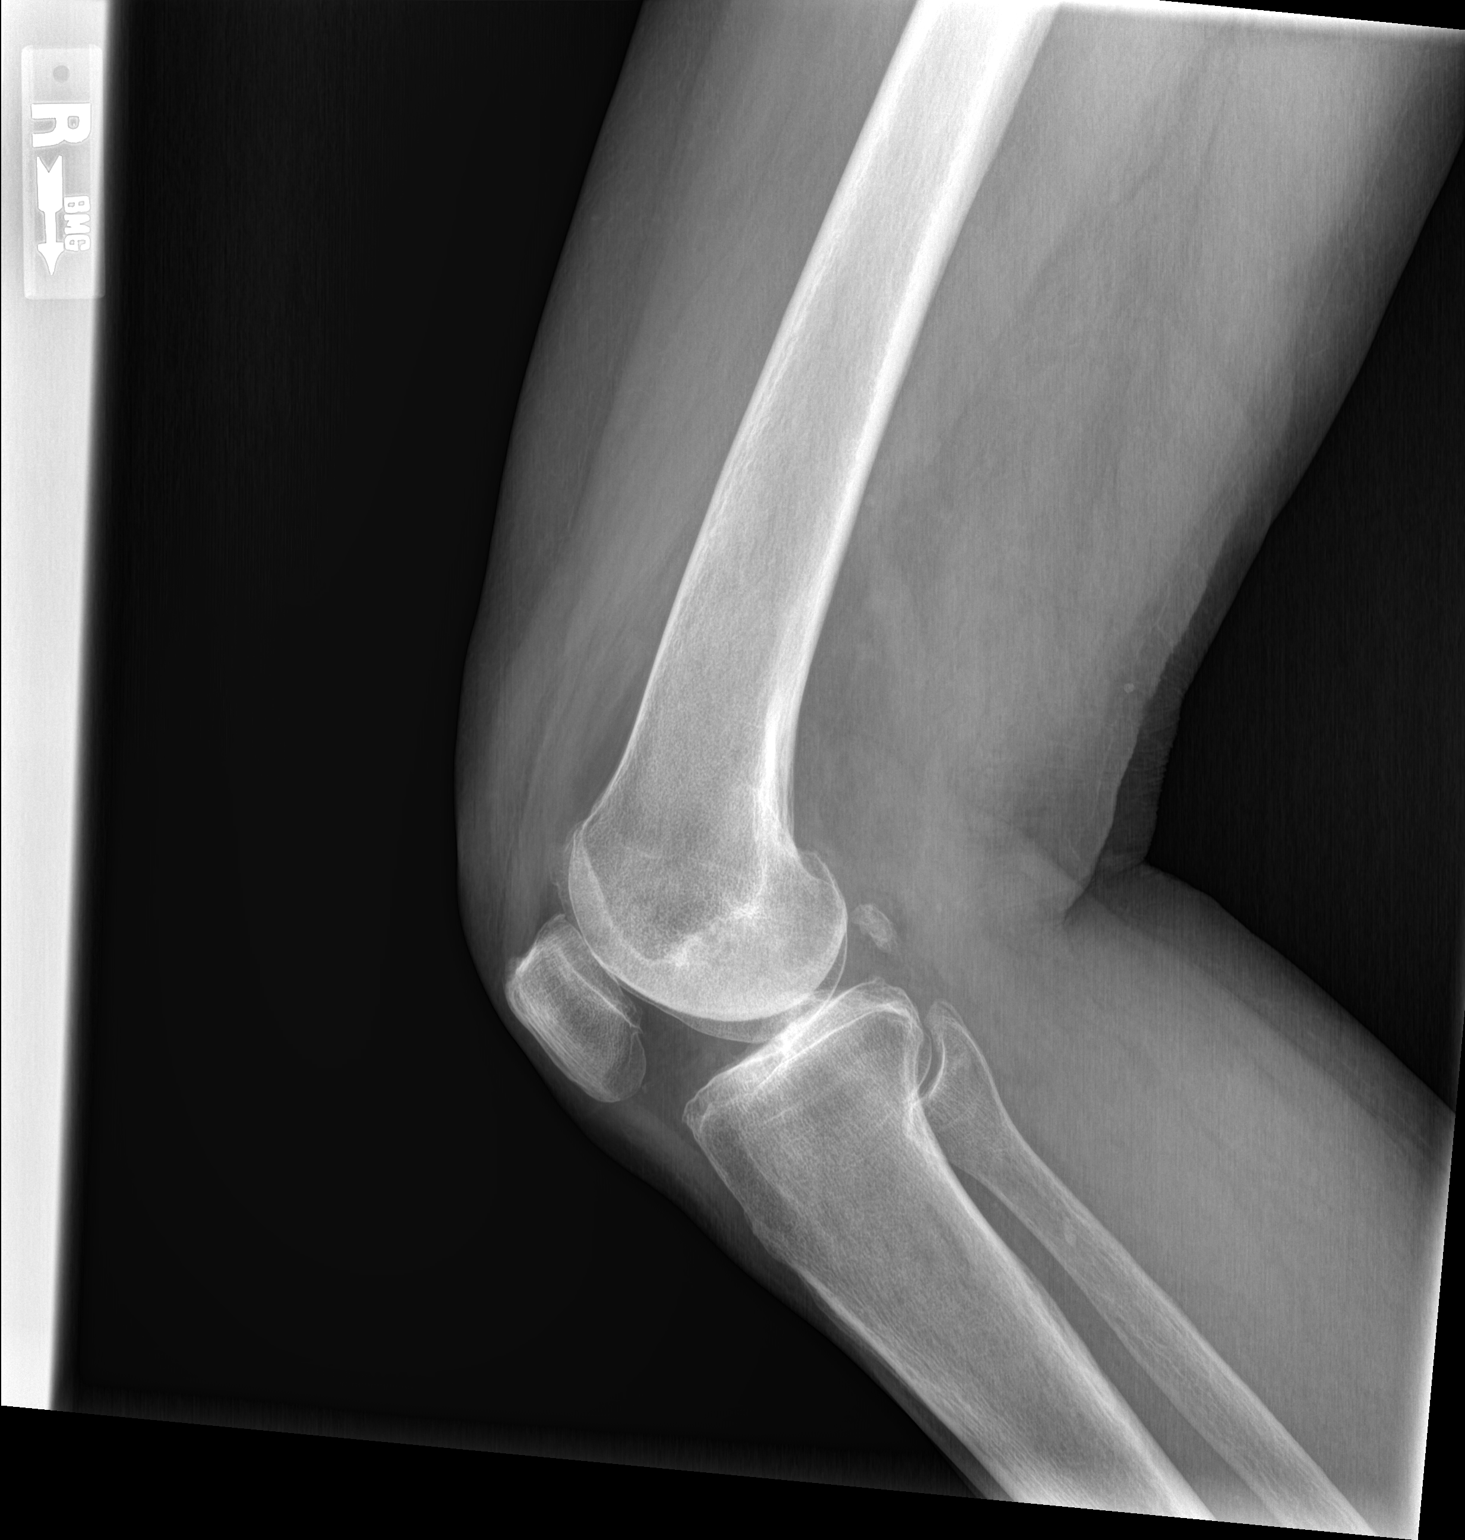

[2 of 2 positions shown; findings below may reference images not displayed]

FINDINGS: Mild degenerative change in the medial joint compartment. Fabella.
Negative for fracture or effusion
IMPRESSION: Mild degenerative change.

## 2017-05-09 MED ORDER — TIZANIDINE HCL 2 MG PO TABS: 2.0000 mg | ORAL_TABLET | Freq: Every day | ORAL | 1 refills | Status: DC

## 2017-05-09 MED ORDER — MELOXICAM 15 MG PO TABS: 15.0000 mg | ORAL_TABLET | Freq: Every day | ORAL | 0 refills | Status: DC

## 2017-05-09 NOTE — Patient Instructions (Addendum)
IF you received an x-ray today, you will receive an invoice from [facility]. Please contact [facility] at [phone] with questions or concerns regarding your invoice.   IF you received labwork today, you will receive an invoice from [facility]. Please contact [facility] at [phone] with questions or concerns regarding your invoice.   Our billing staff will not be able to assist you with questions regarding bills from these companies.  You will be contacted with the lab results as soon as they are available. The fastest way to get your results is to activate your My Chart account. Instructions are located on the last page of this paperwork. If you have not heard from [place] regarding the results in 2 weeks, please contact this office.     Semimembranosus Tendinitis Tendinitis is a condition in which tendons become irritated or swollen. Tendons are bands of tissue that attach muscles to bones and help with joint movements. The semimembranosus tendon attaches one of the muscles on the back of the thigh (hamstring muscles) to the hip bone and the shin bone (tibia). This tendon helps in straightening the hip and bending the knee. Semimembranosus tendinitis often causes pain on the back inner side of the knee. What are the causes? This condition is caused by stress on the tendon, which may result from:  A sudden increase in training or in the intensity of your training.  Overuse of the knee joint.  Your body trying to compensate for other leg injuries.  What increases the risk? The following factors may make you more likely to develop this condition:  Doing activities that involve: ? Repeated or strenuous use of your knee and hip, such as distance running, triathlons, climbing, cycling, or weight lifting. ? Running down hills.  Not warming up properly before activity.  Having: ? Flat feet. ? Improper knee alignment, such as bowed legs. ? Poor strength and  flexibility. ? Osteoarthritis in your knee.  What are the signs or symptoms? Symptoms of this condition include:  Pain or tenderness in the back inner part of the knee. The pain may: ? Spread (radiate) up the back of the thigh or down the back of the calf. ? Get worse during and after exercise that involves use of the knee or hip joints.  Swelling in the affected area.  A crackling sound when the tendon is moved or touched.  How is this diagnosed? This condition may be diagnosed based on your symptoms, your medical history, and a physical exam. Your health care provider may also order tests such as an X-ray, ultrasound, or MRI to confirm the diagnosis or to rule out other conditions. How is this treated? Treatment for this condition may include:  Resting your legs and limiting activities that cause pain.  Using medicines taken by mouth or placed on the skin to help reduce pain and inflammation.  Applying ice to the area during the first 1-2 days to reduce swelling.  Injection of a medicine into the affected area (cortisone shot) to reduce pain.  Wearing a knee brace or sleeve on your knee to keep it from bending.  Working with a physical therapist on exercises to improve strength and flexibility in your leg.  Ensuring that you wear shoes that fit properly.  In some cases, surgery may be needed if other treatments do not help. Follow these instructions at home: Managing pain and swelling  If directed, put ice on the injured area: ? Put ice in a plastic  bag. ? Place a towel between your skin and the bag. ? Leave the ice on for 20 minutes, 2-3 times a day.  Wear a knee sleeve or compressive bandage as told by your health care provider.  If directed, apply heat to the affected area before you exercise. Use the heat source that your health care provider recommends, such as a moist heat pack or a heating pad. ? Place a towel between your skin and the heat source. ? Leave the  heat on for 20-30 minutes. ? Remove the heat if your skin turns bright red. This is especially important if you are unable to feel pain, heat, or cold. You may have a greater risk of getting burned. Activity  Avoid activities that cause pain.  Return to your normal activities as told by your health care provider. Ask your health care provider what activities are safe for you.  Do exercises as told by your health care provider. General instructions  Take over-the-counter and prescription medicines only as told by your health care provider.  Keep all follow-up visits as told by your health care provider. This is important. How is this prevented?  Do not ignore pain in or behind your knee. If you have pain, you should stop your activity, rest, and apply ice.  Warm up and stretch before being active.  Cool down and stretch after being active.  Give your body time to rest between periods of activity.  Make sure to use equipment that fits you.  Be safe and responsible while being active to avoid falls.  Maintain physical fitness, including: ? Strength. ? Flexibility. ? Cardiovascular fitness. ? Endurance.  If you have flat feet, wear arch supports (orthotics). Contact a health care provider if:  Your pain gets worse or does not get better with treatment.  You hear a popping sound or feel a new popping sensation in your knee.  You have increased swelling around your knee. Get help right away if:  You cannot move your knee. This information is not intended to replace advice given to you by your health care provider. Make sure you discuss any questions you have with your health care provider. Document Released: 08/04/2005 Document Revised: 04/08/2016 Document Reviewed: 05/08/2015 Elsevier Interactive Patient Education  2018 [place] [facility] Ask your health care provider which exercises are safe for you. Do exercises exactly as told by your  health care provider and adjust them as directed. It is normal to feel mild stretching, pulling, tightness, or discomfort as you do these exercises, but you should stop right away if you feel sudden pain or your pain gets worse.Do not begin these exercises until told by your health care provider. Stretching and range of motion exercises These exercises warm up your muscles and joints and improve the movement and flexibility of your thigh. These exercises also help to relieve pain, numbness, and tingling. Exercise A: Hamstring stretch, supine  1. Lie on your back. Loop a belt or towel across the ball of your left / right foot The ball of your foot is on the walking surface, right under your toes. 2. Straighten your left / right knee and slowly pull on the belt to raise your leg. Stop when you feel a gentle stretch behind your left / right knee or thigh. ? Do not allow the knee to bend. ? Keep your other leg flat on the floor. 3. Hold this position for __________ seconds. Repeat __________ times. Complete this exercise __________  times a day. Strengthening exercises These exercises build strength and endurance in your thigh. Endurance is the ability to use your muscles for a long time, even after they get tired. Exercise B: Straight leg raises ( hip extensors) 1. Lie on your belly on a bed or a firm surface with a pillow under your hips. 2. Bend your left / right knee so your foot is straight up in the air. 3. Squeeze your buttock muscles and lift your left / right thigh off the bed. Do not let your back arch. 4. Hold this position for __________seconds. 5. Slowly return to the starting position. Let your muscles relax completely before you do another repetition. Repeat __________ times. Complete this exercise __________ times a day. Exercise C: Bridge ( hip extensors) 1. Lie on your back on a firm surface with your knees bent and your feet flat on the floor. 2. Tighten your buttocks muscles  and lift your bottom off the floor until your trunk is level with your thighs. ? You should feel the muscles working in your buttocks and the back of your thighs. If you do not feel these muscles, slide your feet 1-2 inches (2.5-5 cm) farther away from your buttocks. ? Do not arch your back. 3. Hold this position for __________ seconds. 4. Slowly lower your hips to the starting position. 5. Let your buttocks muscles relax completely between repetitions. If this exercise is too easy, try doing it with your arms crossed over your chest. Repeat __________ times. Complete this exercise __________ times a day. Exercise D: Hamstring eccentric, prone 1. Lie on your belly on a bed or on the floor. 2. Start with your legs straight. Cross your legs at the ankles with your left / right leg on top. 3. Using your bottom leg to do the work, bend both knees. 4. Using just your left / right leg alone, slowly lower your leg back down toward the bed. Add a __________ weight as told by your health care provider. 5. Let your muscles relax completely between repetitions. Repeat __________ times. Complete this exercise __________ times a day. Exercise E: Squats 1. Stand in front of a table, with your feet and knees pointing straight ahead. You may rest your hands on the table for balance but not for support. 2. Slowly bend your knees and lower your hips like you are going to sit in a chair. Keep your thighs straight or pointed slightly outward. ? Keep your weight over your heels, not over your toes. ? Keep your lower legs upright so they are parallel with the table legs. ? Do not let your hips go lower than your knees. Stop when your knees are bent to the shape of an upside-down letter "L" (90 degree angle). ? Do not bend lower than told by your health care provider. ? If your knee pain increases, do not bend as low. 3. Hold the squat position __________ seconds. 4. Slowly push with your legs to return to  standing. Do not use your hands to pull yourself to standing. Repeat __________ times. Complete this exercise __________ times a day. This information is not intended to replace advice given to you by your health care provider. Make sure you discuss any questions you have with your health care provider. Document Released: 08/04/2005 Document Revised: 04/10/2016 Document Reviewed: 05/08/2015 Elsevier Interactive Patient Education  [employer].

## 2017-05-09 NOTE — Progress Notes (Signed)
Subjective:    Patient ID: Carol Tucker, female    DOB: 12/09/1940, 76 y.o.   MRN: [id number] Chief Complaint  Patient presents with  . Leg Problem    right leg, sharp pain    HPI  She was painting in her new house so on and off a ladder and moving it around for the past 6 wks after she moved 2 mos ago but about 3 wks prior she suddenly developed severe right hamstring pain (Rt posterior thigh at night from lower glut to immed distal to popliteal fossa) at night only. Attributed it to overuse on the ladder so treated with heat and a motrin - tried to baby it and then had to evacuate due to [place] so hasn't painted/ladder at all - been resting it - for the past 1.5 wks with NO improvement. Doesn't bother her at all during the day with any activity but at night is unbearable. Wakes her up from sleep - can barely move, cries to role over. Last night she took a naproxen and managed to sleep through the night for the first time. Keeps the naproxin and heating pad right next to her bed as always wakes and has to use these. No weakness, no numbness, no rashes, no swelling.  Does not bother her at all during the day with any activity. No CP/tightness/palp/SHoB. No pedal edema.  Has B knee OA which is at its baseline but no other h/o sig low back/pelvic/hip issues.   Entire posterior thigh hurts at night - not worse in one place. No really a cramp just a deep sharp unbearable pain.   Past Medical History:  Diagnosis Date  . Allergy   . Anxiety   . Cancer ([place])   . Depression   . GERD (gastroesophageal reflux disease)   . Hyperlipidemia   . Hypertension   . Osteopenia 8-04   DEXA at [facility] 04/14/14 with lowest Tscore -1.7 at Left femoral neck. FRAX does not indicate need for bisphosphonate but density has sig worsened since prior dexa on 12/17/2009  . Prediabetes    Past Surgical History:  Procedure Laterality Date  . APPENDECTOMY    . DILATION AND CURETTAGE OF UTERUS  1969     MISSED AB  . KNEE ARTHROSCOPY  2007  . 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 TUBAL LIGATION  1980  . tumor removal salivary gland     Current Outpatient Prescriptions on File Prior to Visit  Medication Sig Dispense Refill  . ALPRAZolam (XANAX) 0.5 MG tablet Take 1 tablet (0.5 mg total) by mouth 3 (three) times daily as needed for anxiety (chest pain). 30 tablet 0  . aspirin EC 81 MG tablet Take 81 mg by mouth daily.    . calcium carbonate (OS-CAL) 600 MG TABS tablet Take 600 mg by mouth 2 (two) times daily with a meal.    . cholecalciferol (VITAMIN D) 1000 UNITS tablet Take 1,000 Units by mouth 2 (two) times daily.    . famotidine (PEPCID) 20 MG tablet Take 1 tablet (20 mg total) by mouth 2 (two) times daily. 180 tablet 3  . lisinopril-hydrochlorothiazide (PRINZIDE,ZESTORETIC) 10-12.5 MG tablet TAKE 1 TABLET BY MOUTH DAILY. 30 tablet 5  . Multiple Vitamins-Minerals (VISION-VITE PRESERVE PO) Take 1 tablet by mouth daily.     . pravastatin (PRAVACHOL) 40 MG tablet Take 1 tablet (40 mg total) by mouth daily. 90 tablet 3   No current facility-administered medications on file prior  to visit.    Allergies  Allergen Reactions  . Metoclopramide Hcl     REACTION: tremors, clinched jaws     REGLAN   Family History  Problem Relation Age of Onset  . Hypertension Father   . Heart disease Father   . Cancer Father        BLADDER & LUNG  . Heart disease Paternal Grandmother   . Alzheimer's disease Mother   . Heart disease Brother   . Heart disease Paternal Grandfather   . Diabetes Paternal Grandfather   . Colon cancer Neg Hx   . Pancreatic cancer Neg Hx   . Stomach cancer Neg Hx    Social History   Social History  . Marital status: Divorced    Spouse name: N/A  . Number of children: N/A  . Years of education: N/A   Social History Main Topics  . Smoking status: Former Research scientist (life sciences)  . Smokeless tobacco: Never Used  . Alcohol use Yes     Comment: Rare  . Drug use: No  . Sexual activity:  No   Other Topics Concern  . None   Social History Narrative  . None   Depression screen [facility] 2/9 05/09/2017 03/12/2017 09/11/2016 06/11/2016 06/04/2016  Decreased Interest 0 0 0 0 0  Down, Depressed, Hopeless 0 0 0 0 0  PHQ - 2 Score 0 0 0 0 0     Review of Systems  Constitutional: Positive for activity change. Negative for chills, fever and unexpected weight change.  Cardiovascular: Negative for leg swelling.  Gastrointestinal: Negative for constipation, diarrhea and rectal pain.  Genitourinary: Negative for decreased urine volume, difficulty urinating, dysuria, frequency, pelvic pain and urgency.  Musculoskeletal: Positive for arthralgias (B knees at baseline, mild) and myalgias (Rt posterior thigh at night from lower glut to immed distal to popliteal fossa). Negative for back pain, gait problem and joint swelling.  Skin: Negative for color change and rash.  Neurological: Negative for weakness and numbness.  Hematological: Does not bruise/bleed easily.  Psychiatric/Behavioral: Positive for sleep disturbance.       Objective:   Physical Exam  Constitutional: She is oriented to person, place, and time. She appears well-developed and well-nourished.  HENT:  Head: Normocephalic.  Eyes: Conjunctivae are normal. No scleral icterus.  Neck: Normal range of motion. Neck supple.  Cardiovascular: Normal rate, regular rhythm and normal heart sounds.   Pulmonary/Chest: Effort normal and breath sounds normal. No respiratory distress.  Musculoskeletal: She exhibits no edema.       Right hip: Normal.       Left hip: Normal. She exhibits normal range of motion, normal strength, no tenderness, no bony tenderness, no swelling and no crepitus.       Right knee: She exhibits decreased range of motion.       Left knee: She exhibits decreased range of motion.       Right ankle: She exhibits no swelling and normal pulse. Achilles tendon exhibits no pain and normal Thompson's test results.        Left ankle: She exhibits no swelling and normal pulse. Achilles tendon exhibits no pain and normal Thompson's test results.       Lumbar back: She exhibits no tenderness, no bony tenderness, no deformity, no pain and no spasm.       Right upper leg: Normal. She exhibits no tenderness, no bony tenderness, no swelling, no edema and no deformity.       Left upper leg: Normal. She exhibits  no tenderness, no bony tenderness, no swelling and no edema.       Right lower leg: She exhibits no tenderness, no swelling and no edema.       Left lower leg: She exhibits no tenderness, no swelling and no edema.  Mild Decreased ROM flex/ext in B knees. Neg straight leg raise B.  Neurological: She is alert and oriented to person, place, and time. She has normal strength and normal reflexes. No sensory deficit. She exhibits normal muscle tone. Coordination and gait normal.  Reflex Scores:      Patellar reflexes are 2+ on the right side and 2+ on the left side.      Achilles reflexes are 2+ on the right side and 2+ on the left side. Skin: Skin is warm and dry. No erythema.  Psychiatric: She has a normal mood and affect. Her behavior is normal.         BP (!) 141/69   Pulse 66   Temp 98.5 F (36.9 C) (Oral)   Resp 16   Ht _0  (1.6 m)   Wt 146 lb 6.4 oz (66.4 kg)   SpO2 97%   BMI 25.93 kg/m    Dg Knee 1-2 Views Right  Result Date: 05/09/2017 CLINICAL DATA:  Acute right leg pain. EXAM: RIGHT KNEE - 1-2 VIEW COMPARISON:  12/15/2005 FINDINGS: Mild degenerative change in the medial joint compartment. Fabella. Negative for fracture or effusion IMPRESSION: Mild degenerative change. Electronically Signed   By: Franchot Gallo M.D.   On: 05/09/2017 16:31   Dg Hip Unilat W Or W/o Pelvis 2-3 Views Right  Result Date: 05/09/2017 CLINICAL DATA:  Acute right leg pain nocturnal pain EXAM: DG HIP (WITH OR WITHOUT PELVIS) 2-3V RIGHT COMPARISON:  None. FINDINGS: There is no evidence of hip fracture or dislocation.  There is no evidence of arthropathy or other focal bone abnormality. IMPRESSION: Negative. Electronically Signed   By: Franchot Gallo M.D.   On: 05/09/2017 16:33   Dg Femur, Min 2 Views Right  Result Date: 05/09/2017 CLINICAL DATA:  Right leg pain EXAM: RIGHT FEMUR 2 VIEWS COMPARISON:  None. FINDINGS: There is no evidence of fracture or other focal bone lesions. Soft tissues are unremarkable. IMPRESSION: Negative. Electronically Signed   By: Franchot Gallo M.D.   On: 05/09/2017 16:31     Assessment & Plan:   1. Acute leg pain, right   2. Hamstring injury, left, initial encounter   3. Nocturnal pain    Unknown etiology - very atypical since asymptomatic during day and normal exam but severe sxs at night only with sudden onset, nki, and unchanged in severity x 3 wks despite relative rest, heat, nsaids. Will try more proactive treatment for hamstring strain/semimembranosus tendinitis with scheduled qhs nsaid and muscle relaxer. Relative rest, cont heat. Hold statin for several wks until healed. If not starting to get some improvement in several days, call and come in for labs to check: Ck, esr/crp, bmp, mag, ferritin, b12? And could refer to ortho in [place], [place] (pt planning to return home in 1 wk so appt would be after 10/1)  Orders Placed This Encounter  Procedures  . DG Knee 1-2 Views Right    Standing Status:   Future    Number of Occurrences:   1    Standing Expiration Date:   05/09/2018    Order Specific Question:   Reason for Exam (SYMPTOM  OR DIAGNOSIS REQUIRED)    Answer:   3 wks of  severe nocturnal pain over entire hamstring/post thigh/popliteal fossa, nki, normal exam, no daytime sxs at all    Order Specific Question:   Preferred imaging location?    Answer:   External  . DG FEMUR, MIN 2 VIEWS RIGHT    Standing Status:   Future    Number of Occurrences:   1    Standing Expiration Date:   07/09/2018    Order Specific Question:   Reason for Exam (SYMPTOM  OR DIAGNOSIS  REQUIRED)    Answer:   3 wks of severe nocturnal pain over entire hamstring/post thigh/popliteal fossa, nki, normal exam, no daytime sxs at all    Order Specific Question:   Preferred imaging location?    Answer:   External  . DG HIP UNILAT W OR W/O PELVIS 2-3 VIEWS RIGHT    Standing Status:   Future    Number of Occurrences:   1    Standing Expiration Date:   05/09/2018    Order Specific Question:   Reason for Exam (SYMPTOM  OR DIAGNOSIS REQUIRED)    Answer:   3 wks of severe nocturnal pain over entire hamstring/post thigh/popliteal fossa, nki, normal exam, no daytime sxs at all    Order Specific Question:   Preferred imaging location?    Answer:   External    Meds ordered this encounter  Medications  . tiZANidine (ZANAFLEX) 2 MG tablet    Sig: Take 1-2 tablets (2-4 mg total) by mouth at bedtime.    Dispense:  30 tablet    Refill:  1  . meloxicam (MOBIC) 15 MG tablet    Sig: Take 1 tablet (15 mg total) by mouth daily.    Dispense:  30 tablet    Refill:  0    Delman Cheadle, M.D.  Primary Care at [facility] [address] [place], [place] [address] [phone] phone [phone] fax  05/09/17 6:53 PM

## 2017-05-23 ENCOUNTER — Other Ambulatory Visit: Payer: Self-pay | Admitting: Physician Assistant

## 2017-09-03 ENCOUNTER — Encounter: Payer: Medicare Other | Admitting: Family Medicine

## 2017-09-03 ENCOUNTER — Ambulatory Visit: Payer: Medicare Other

## 2017-09-03 ENCOUNTER — Telehealth: Payer: Self-pay

## 2017-09-03 NOTE — Telephone Encounter (Signed)
Called patient to reschedule AWV and patient states that she will schedule it when she comes in on Monday to see Dr. Brigitte Pulse.

## 2017-09-07 ENCOUNTER — Other Ambulatory Visit: Payer: Self-pay

## 2017-09-07 ENCOUNTER — Ambulatory Visit: Payer: Medicare Other | Admitting: Family Medicine

## 2017-09-07 ENCOUNTER — Encounter: Payer: Self-pay | Admitting: Family Medicine

## 2017-09-07 VITALS — BP 138/62 | HR 75 | Temp 98.2°F | Resp 16 | Ht 62.25 in | Wt 145.2 lb

## 2017-09-07 DIAGNOSIS — R739 Hyperglycemia, unspecified: Secondary | ICD-10-CM | POA: Diagnosis not present

## 2017-09-07 DIAGNOSIS — R55 Syncope and collapse: Secondary | ICD-10-CM

## 2017-09-07 DIAGNOSIS — I10 Essential (primary) hypertension: Secondary | ICD-10-CM

## 2017-09-07 DIAGNOSIS — N390 Urinary tract infection, site not specified: Secondary | ICD-10-CM

## 2017-09-07 DIAGNOSIS — Z5181 Encounter for therapeutic drug level monitoring: Secondary | ICD-10-CM | POA: Diagnosis not present

## 2017-09-07 DIAGNOSIS — R7303 Prediabetes: Secondary | ICD-10-CM | POA: Diagnosis not present

## 2017-09-07 LAB — POC MICROSCOPIC URINALYSIS (UMFC): Mucus: ABSENT

## 2017-09-07 LAB — POCT URINALYSIS DIP (MANUAL ENTRY)
BILIRUBIN UA: NEGATIVE
BILIRUBIN UA: NEGATIVE mg/dL
Glucose, UA: NEGATIVE mg/dL
Leukocytes, UA: NEGATIVE
Nitrite, UA: NEGATIVE
Protein Ur, POC: NEGATIVE mg/dL
Spec Grav, UA: 1.015 (ref 1.010–1.025)
Urobilinogen, UA: 0.2 E.U./dL
pH, UA: 7 (ref 5.0–8.0)

## 2017-09-07 LAB — POCT CBC
Granulocyte percent: 60 %G (ref 37–80)
HCT, POC: 34.4 % — AB (ref 37.7–47.9)
HEMOGLOBIN: 11.7 g/dL — AB (ref 12.2–16.2)
LYMPH, POC: 2.3 (ref 0.6–3.4)
MCH, POC: 30.8 pg (ref 27–31.2)
MCHC: 34 g/dL (ref 31.8–35.4)
MCV: 90.6 fL (ref 80–97)
MID (cbc): 0.4 (ref 0–0.9)
MPV: 7.6 fL (ref 0–99.8)
POC Granulocyte: 4.1 (ref 2–6.9)
POC LYMPH PERCENT: 33.9 %L (ref 10–50)
POC MID %: 6.1 %M (ref 0–12)
Platelet Count, POC: 219 10*3/uL (ref 142–424)
RBC: 3.8 M/uL — AB (ref 4.04–5.48)
RDW, POC: 13.5 %
WBC: 6.9 10*3/uL (ref 4.6–10.2)

## 2017-09-07 MED ORDER — LISINOPRIL 20 MG PO TABS: 20.0000 mg | ORAL_TABLET | Freq: Every day | ORAL | 3 refills | Status: DC

## 2017-09-07 NOTE — Patient Instructions (Addendum)
You are mildly anemic - I think this is within the range of lab testing error so likely isn't a TRUE change from your prior red blood cell levels but would be a good idea to have rechecked in a month.   Stop the antibiotic.  Try lisinopril 20 rather than lisinopril-hctz 10-12.5.  IF you received an x-ray today, you will receive an invoice from [facility]. Please contact [facility] at [phone] with questions or concerns regarding your invoice.   IF you received labwork today, you will receive an invoice from [place]. Please contact [facility] at [phone] with questions or concerns regarding your invoice.   Our billing staff will not be able to assist you with questions regarding bills from these companies.  You will be contacted with the lab results as soon as they are available. The fastest way to get your results is to activate your My Chart account. Instructions are located on the last page of this paperwork. If you have not heard from [place] regarding the results in 2 weeks, please contact this office.     Syncope Syncope is when you temporarily lose consciousness. Syncope may also be called fainting or passing out. It is caused by a sudden decrease in blood flow to the brain. Even though most causes of syncope are not dangerous, syncope can be a sign of a serious medical problem. Signs that you may be about to faint include:  Feeling dizzy or light-headed.  Feeling nauseous.  Seeing all white or all black in your field of vision.  Having cold, clammy skin.  If you fainted, get medical help right away.Call your local emergency services (911 in the [place] Do not drive yourself to the hospital. Follow these instructions at home: Pay attention to any changes in your symptoms. Take these actions to help with your condition:  Have someone stay with you until you feel stable.  Do not drive, use machinery, or play sports until your health care provider says it is  okay.  Keep all follow-up visits as told by your health care provider. This is important.  If you start to feel like you might faint, lie down right away and raise (elevate) your feet above the level of your heart. Breathe deeply and steadily. Wait until all of the symptoms have passed.  Drink enough fluid to keep your urine clear or pale yellow.  If you are taking blood pressure or heart medicine, get up slowly and take several minutes to sit and then stand. This can reduce dizziness.  Take over-the-counter and prescription medicines only as told by your health care provider.  Get help right away if:  You have a severe headache.  You have unusual pain in your chest, abdomen, or back.  You are bleeding from your mouth or rectum, or you have black or tarry stool.  You have a very fast or irregular heartbeat (palpitations).  You have pain with breathing.  You faint once or repeatedly.  You have a seizure.  You are confused.  You have trouble walking.  You have severe weakness.  You have vision problems. These symptoms may represent a serious problem that is an emergency. Do not wait to see if your symptoms will go away. Get medical help right away. Call your local emergency services (911 in the [place] Do not drive yourself to the hospital. This information is not intended to replace advice given to you by your health care provider. Make sure you discuss any questions you have  with your health care provider. Document Released: 08/04/2005 Document Revised: 01/10/2016 Document Reviewed: 04/18/2015 Elsevier Interactive Patient Education  [employer].

## 2017-09-07 NOTE — Progress Notes (Signed)
Subjective:  By signing my name below, I, Carol Tucker, attest that this documentation has been prepared under the direction and in the presence of Delman Cheadle, MD Electronically Signed: Ladene Artist, ED Scribe 09/07/2017 at 4:52 PM.   Patient ID: Carol Tucker, female    DOB: 1940/11/08, 77 y.o.   MRN: [id number]  Chief Complaint  Patient presents with  . Hospitalization Follow-up    SYNCOPE @ [facility]   HPI Carol Tucker is a 77 y.o. female who presents to Primary Care at [facility] for hospital f/u. Pt states that she drove up from [place] to [place], [place]. After dinner she felt nausea ~1 hr afterwards. Pt states that she went to nap, got up to go to the bathroom to vomit when she fainted and struck her chin on a cabinet. Her daughter and son-in-law came in, called EMS who checked her BP which she states her systolic was below 500 and diastolic was below 40. She was moved to the toilet to recheck her BP but states she fainted again. Reports that she was then taken to [facility] in [place], diagnosed with dehydration and an UTI with an indeterminate urine culture obtained from a bedpan. Reports vomiting multiple times at home and at the hospital. She was given IV fluids, Zofran, Phenergan and started on Cipro x 10 days. Had a normal MRI, XR, EKG.  Pt has been taking her BP meds and checking her BP with readings of 111/60s this morning, systolic of 938 this afternoon. Denies fever, chills, diarrhea, any other symptoms prior to syncope, lightheadedness since.  Past Medical History:  Diagnosis Date  . Allergy   . Anxiety   . Cancer ([place])   . Depression   . GERD (gastroesophageal reflux disease)   . Hyperlipidemia   . Hypertension   . Osteopenia 8-04   DEXA at [facility] 04/14/14 with lowest Tscore -1.7 at Left femoral neck. FRAX does not indicate need for bisphosphonate but density has sig worsened since prior dexa on 12/17/2009  . Prediabetes     Current Outpatient Medications on File Prior to Visit  Medication Sig Dispense Refill  . ALPRAZolam (XANAX) 0.5 MG tablet Take 1 tablet (0.5 mg total) by mouth 3 (three) times daily as needed for anxiety (chest pain). 30 tablet 0  . aspirin EC 81 MG tablet Take 81 mg by mouth daily.    . calcium carbonate (OS-CAL) 600 MG TABS tablet Take 600 mg by mouth 2 (two) times daily with a meal.    . cholecalciferol (VITAMIN D) 1000 UNITS tablet Take 1,000 Units by mouth 2 (two) times daily.    . famotidine (PEPCID) 20 MG tablet Take 1 tablet (20 mg total) by mouth 2 (two) times daily. 180 tablet 3  . lisinopril-hydrochlorothiazide (PRINZIDE,ZESTORETIC) 10-12.5 MG tablet TAKE 1 TABLET BY MOUTH DAILY. 30 tablet 4  . meloxicam (MOBIC) 15 MG tablet Take 1 tablet (15 mg total) by mouth daily. 30 tablet 0  . Multiple Vitamins-Minerals (VISION-VITE PRESERVE PO) Take 1 tablet by mouth daily.     . pravastatin (PRAVACHOL) 40 MG tablet Take 1 tablet (40 mg total) by mouth daily. 90 tablet 3  . tiZANidine (ZANAFLEX) 2 MG tablet Take 1-2 tablets (2-4 mg total) by mouth at bedtime. 30 tablet 1   No current facility-administered medications on file prior to visit.    Allergies  Allergen Reactions  . Metoclopramide Hcl     REACTION: tremors, clinched jaws  REGLAN   Past Surgical History:  Procedure Laterality Date  . APPENDECTOMY    . DILATION AND CURETTAGE OF UTERUS  1969   MISSED AB  . KNEE ARTHROSCOPY  2007  . 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 TUBAL LIGATION  1980  . tumor removal salivary gland     Family History  Problem Relation Age of Onset  . Hypertension Father   . Heart disease Father   . Cancer Father        BLADDER & LUNG  . Heart disease Paternal Grandmother   . Alzheimer's disease Mother   . Heart disease Brother   . Heart disease Paternal Grandfather   . Diabetes Paternal Grandfather   . Colon cancer Neg Hx   . Pancreatic cancer Neg Hx   . Stomach cancer Neg Hx    Social  History   Socioeconomic History  . Marital status: Divorced    Spouse name: None  . Number of children: None  . Years of education: None  . Highest education level: None  Social Needs  . Financial resource strain: None  . Food insecurity - worry: None  . Food insecurity - inability: None  . Transportation needs - medical: None  . Transportation needs - non-medical: None  Occupational History  . None  Tobacco Use  . Smoking status: Former Research scientist (life sciences)  . Smokeless tobacco: Never Used  Substance and Sexual Activity  . Alcohol use: Yes    Comment: Rare  . Drug use: No  . Sexual activity: No  Other Topics Concern  . None  Social History Narrative  . None   Depression screen [facility] 2/9 09/07/2017 05/09/2017 03/12/2017 09/11/2016 06/11/2016  Decreased Interest 0 0 0 0 0  Down, Depressed, Hopeless 0 0 0 0 0  PHQ - 2 Score 0 0 0 0 0    Review of Systems  Constitutional: Negative for chills and fever.  Gastrointestinal: Positive for nausea and vomiting (resolved). Negative for diarrhea.  Neurological: Positive for syncope. Negative for light-headedness.      Objective:   Physical Exam  Constitutional: She is oriented to person, place, and time. She appears well-developed and well-nourished. No distress.  HENT:  Head: Normocephalic and atraumatic.  Eyes: Conjunctivae and EOM are normal.  Neck: Neck supple. No tracheal deviation present.  Cardiovascular: Normal rate, regular rhythm and normal heart sounds.  Pulmonary/Chest: Effort normal and breath sounds normal. No respiratory distress.  Abdominal: Soft. Bowel sounds are normal. She exhibits no distension. There is no hepatosplenomegaly. There is no tenderness. There is no CVA tenderness.  Musculoskeletal: Normal range of motion.  Neurological: She is alert and oriented to person, place, and time.  Skin: Skin is warm and dry.  Psychiatric: She has a normal mood and affect. Her behavior is normal.  Nursing note and vitals  reviewed.  BP 138/62 (BP Location: Left Arm, Patient Position: Sitting, Cuff Size: Normal)   Pulse 75   Temp 98.2 F (36.8 C) (Oral)   Resp 16   Ht 5' 2.25" (1.581 m)   Wt 145 lb 3.2 oz (65.9 kg)   SpO2 97%   BMI 26.34 kg/m Orthostatics - Laying: BP 143/73, HR 66. Sitting: 120/69, 74. Standing: 144/73, 64.    Results for orders placed or performed in visit on 09/07/17  POCT urinalysis dipstick  Result Value Ref Range   Color, UA yellow yellow   Clarity, UA clear clear   Glucose, UA negative negative mg/dL  Bilirubin, UA negative negative   Ketones, POC UA negative negative mg/dL   Spec Grav, UA 1.015 1.010 - 1.025   Blood, UA small (A) negative   pH, UA 7.0 5.0 - 8.0   Protein Ur, POC negative negative mg/dL   Urobilinogen, UA 0.2 0.2 or 1.0 E.U./dL   Nitrite, UA Negative Negative   Leukocytes, UA Negative Negative  POCT Microscopic Urinalysis ([facility])  Result Value Ref Range   WBC,UR,HPF,POC None None WBC/hpf   RBC,UR,HPF,POC None None RBC/hpf   Bacteria None None, Too numerous to count   Mucus Absent Absent   Epithelial Cells, UR Per Microscopy Few (A) None, Too numerous to count cells/hpf   Assessment & Plan:   1. Urinary tract infection without hematuria, site unspecified - culture negative. Has completed cipro 500 bid x 3d so ok to stop (given 10d course).  2. Essential hypertension - stop lisinopril-hctz 10-12.5 as hctz portion may be contributing to to easily  becoming dehydrated -> syncope, change to lisinopril 20 though may need to reduce dose if any low BP  3. Syncope, unspecified syncope type - suspect secondary to dehydration from acute gastroenteritis   4. Medication monitoring encounter   5. Hyperglycemia   6. Prediabetes     Orders Placed This Encounter  Procedures  . Urine Culture  . Comprehensive metabolic panel  . Hemoglobin A1c  . Orthostatic vital signs  . POCT urinalysis dipstick  . POCT Microscopic Urinalysis ([facility])  . POCT CBC    Meds  ordered this encounter  Medications  . lisinopril (PRINIVIL,ZESTRIL) 20 MG tablet    Sig: Take 1 tablet (20 mg total) by mouth daily.    Dispense:  90 tablet    Refill:  3    I personally performed the services described in this documentation, which was scribed in my presence. The recorded information has been reviewed and considered, and addended by me as needed.   Delman Cheadle, M.D.  Primary Care at [facility] [address] [place], [place] [address] [phone] phone [phone] fax  09/09/17 6:20 PM

## 2017-09-08 LAB — COMPREHENSIVE METABOLIC PANEL
A/G RATIO: 1.7 (ref 1.2–2.2)
ALT: 17 IU/L (ref 0–32)
AST: 13 IU/L (ref 0–40)
Albumin: 4.5 g/dL (ref 3.5–4.8)
Alkaline Phosphatase: 59 IU/L (ref 39–117)
BUN/Creatinine Ratio: 21 (ref 12–28)
BUN: 18 mg/dL (ref 8–27)
Bilirubin Total: 0.2 mg/dL (ref 0.0–1.2)
CALCIUM: 9.5 mg/dL (ref 8.7–10.3)
CHLORIDE: 101 mmol/L (ref 96–106)
CO2: 21 mmol/L (ref 20–29)
Creatinine, Ser: 0.87 mg/dL (ref 0.57–1.00)
GFR, EST AFRICAN AMERICAN: 75 mL/min/{1.73_m2} (ref >59)
GFR, EST NON AFRICAN AMERICAN: 65 mL/min/{1.73_m2} (ref >59)
GLUCOSE: 108 mg/dL — AB (ref 65–99)
Globulin, Total: 2.7 g/dL (ref 1.5–4.5)
POTASSIUM: 4.2 mmol/L (ref 3.5–5.2)
Sodium: 141 mmol/L (ref 134–144)
TOTAL PROTEIN: 7.2 g/dL (ref 6.0–8.5)

## 2017-09-08 LAB — URINE CULTURE: ORGANISM ID, BACTERIA: NO GROWTH

## 2017-09-08 LAB — HEMOGLOBIN A1C
ESTIMATED AVERAGE GLUCOSE: 123 mg/dL
Hgb A1c MFr Bld: 5.9 % — ABNORMAL HIGH (ref 4.8–5.6)

## 2017-12-01 ENCOUNTER — Other Ambulatory Visit: Payer: Self-pay | Admitting: Family Medicine

## 2017-12-08 ENCOUNTER — Other Ambulatory Visit: Payer: Self-pay | Admitting: Family Medicine

## 2017-12-10 ENCOUNTER — Telehealth (INDEPENDENT_AMBULATORY_CARE_PROVIDER_SITE_OTHER): Payer: Self-pay | Admitting: Orthopedic Surgery

## 2017-12-10 ENCOUNTER — Telehealth (INDEPENDENT_AMBULATORY_CARE_PROVIDER_SITE_OTHER): Payer: Self-pay

## 2017-12-10 ENCOUNTER — Other Ambulatory Visit: Payer: Self-pay | Admitting: Family Medicine

## 2017-12-10 NOTE — Telephone Encounter (Signed)
Patient called requesting another gel shot. If you could give her a call back at [phone]

## 2017-12-10 NOTE — Telephone Encounter (Signed)
Monovisc was in March, too soon for another, call patient to discuss.

## 2017-12-10 NOTE — Telephone Encounter (Signed)
Noted. Submitted application online for Monovisc injection, bilateral knee.

## 2017-12-10 NOTE — Telephone Encounter (Signed)
Correction last Monovisc was 10/2016.  Patient is scheduled for 01/04/18 to repeat bil Monovisc injections. FYI

## 2017-12-10 NOTE — Telephone Encounter (Signed)
-----   Message from [facility], RT sent at 12/10/2017  3:29 PM EDT ----- Regarding: Monovisc injections bilateral [place] patient, lives in [place] now, had Monovisc last March 2018.  Wants to repeat.  She will be in [facility] 01/04/18, I went ahead and sched her at 1pm that day. Please check benefits.

## 2017-12-29 ENCOUNTER — Telehealth (INDEPENDENT_AMBULATORY_CARE_PROVIDER_SITE_OTHER): Payer: Self-pay

## 2017-12-29 NOTE — Telephone Encounter (Signed)
Talked with Carol Tucker. With [facility] and she stated that no PA is required for [id number].  Message sent to [facility] to clarify, due to VOB stating that a PA was needed. Patient has [facility] Medicare.

## 2018-01-04 ENCOUNTER — Encounter (INDEPENDENT_AMBULATORY_CARE_PROVIDER_SITE_OTHER): Payer: Self-pay | Admitting: Orthopedic Surgery

## 2018-01-04 ENCOUNTER — Telehealth (INDEPENDENT_AMBULATORY_CARE_PROVIDER_SITE_OTHER): Payer: Self-pay

## 2018-01-04 ENCOUNTER — Ambulatory Visit (INDEPENDENT_AMBULATORY_CARE_PROVIDER_SITE_OTHER): Payer: Medicare Other | Admitting: Orthopedic Surgery

## 2018-01-04 DIAGNOSIS — M1711 Unilateral primary osteoarthritis, right knee: Secondary | ICD-10-CM

## 2018-01-04 DIAGNOSIS — M1712 Unilateral primary osteoarthritis, left knee: Secondary | ICD-10-CM | POA: Diagnosis not present

## 2018-01-04 NOTE — Telephone Encounter (Signed)
Received email from [place] on 12/29/17, stating that no PA is required for Carol Tucker.  Patient has appointment for today 01/04/18 with Dr. Marlou Sa

## 2018-01-09 ENCOUNTER — Encounter (INDEPENDENT_AMBULATORY_CARE_PROVIDER_SITE_OTHER): Payer: Self-pay | Admitting: Orthopedic Surgery

## 2018-01-09 MED ORDER — LIDOCAINE HCL 1 % IJ SOLN
5.0000 mL | INTRAMUSCULAR | Status: AC | PRN
  Administered 2018-01-09: 5 mL

## 2018-01-09 MED ORDER — HYALURONAN 88 MG/4ML IX SOSY
88.0000 mg | PREFILLED_SYRINGE | INTRA_ARTICULAR | Status: AC | PRN
Start: 2018-01-09 — End: 2018-01-09
  Administered 2018-01-09: 88 mg via INTRA_ARTICULAR

## 2018-01-09 MED ORDER — HYALURONAN 88 MG/4ML IX SOSY
88.0000 mg | PREFILLED_SYRINGE | INTRA_ARTICULAR | Status: AC | PRN
  Administered 2018-01-09: 88 mg via INTRA_ARTICULAR

## 2018-01-09 NOTE — Progress Notes (Signed)
   Procedure Note  Patient: Carol Tucker             Date of Birth: 02-07-1941           MRN: [id number]             Visit Date: 01/04/2018  Procedures: Visit Diagnoses: Unilateral primary osteoarthritis, left knee  Unilateral primary osteoarthritis, right knee  Large Joint Inj: bilateral knee on 01/09/2018 1:56 PM Indications: diagnostic evaluation, joint swelling and pain Details: 18 G 1.5 in needle, superolateral approach  Arthrogram: No  Medications (Right): 5 mL lidocaine 1 %; 88 mg Hyaluronan 88 MG/4ML Medications (Left): 5 mL lidocaine 1 %; 88 mg Hyaluronan 88 MG/4ML Outcome: tolerated well, no immediate complications Procedure, treatment alternatives, risks and benefits explained, specific risks discussed. Consent was given by the patient. Immediately prior to procedure a time out was called to verify the correct patient, procedure, equipment, support staff and site/side marked as required. Patient was prepped and draped in the usual sterile fashion.

## 2018-03-29 ENCOUNTER — Other Ambulatory Visit: Payer: Self-pay | Admitting: Family Medicine

## 2018-03-29 NOTE — Telephone Encounter (Signed)
Famotidine refill Last Refill: 03/12/17 # 180; RF x 3 Last OV: 09/07/17 Hosp. F/u  PCP: Dr. Brigitte Pulse  Pharmacy: [facility] [address] Marland Kitchen [place]   Pt. Hasn't been seen since [place]. F/u; will refill one month supply will one refill, and recommend to schedule an appt.

## 2018-03-31 ENCOUNTER — Other Ambulatory Visit: Payer: Self-pay | Admitting: Family Medicine

## 2018-04-01 NOTE — Telephone Encounter (Signed)
pravastatin refill Last Refill:12/08/17 # 90 Last OV: 09/07/17 PCP: Dr Delman Cheadle Pharmacy: [facility], [place]  Last lipid panel 03/12/17

## 2018-05-26 ENCOUNTER — Telehealth (INDEPENDENT_AMBULATORY_CARE_PROVIDER_SITE_OTHER): Payer: Self-pay | Admitting: Orthopedic Surgery

## 2018-05-26 NOTE — Telephone Encounter (Signed)
Can you please get patient approved for bilateral injections?

## 2018-05-26 NOTE — Telephone Encounter (Signed)
Patient called asked if she can get the monovisc injection in her knees on 07/26/18. Patient said she will be in town that day. The number to contact patient is [phone]

## 2018-05-26 NOTE — Telephone Encounter (Signed)
Noted  

## 2018-05-27 ENCOUNTER — Telehealth (INDEPENDENT_AMBULATORY_CARE_PROVIDER_SITE_OTHER): Payer: Self-pay

## 2018-05-27 NOTE — Telephone Encounter (Signed)
Submitted VOB for Monovisc, bilateral knee. 

## 2018-06-03 ENCOUNTER — Telehealth (INDEPENDENT_AMBULATORY_CARE_PROVIDER_SITE_OTHER): Payer: Self-pay

## 2018-06-03 NOTE — Telephone Encounter (Signed)
Patient is approved for Monovisc, bilateral knee. Buy & Bill Covered at 100% through her insurance Co-pay $90.00 No PA required  Appt. 07/26/2018

## 2018-07-06 ENCOUNTER — Other Ambulatory Visit: Payer: Self-pay | Admitting: Family Medicine

## 2018-07-08 ENCOUNTER — Other Ambulatory Visit: Payer: Self-pay | Admitting: Family Medicine

## 2018-07-26 ENCOUNTER — Ambulatory Visit (INDEPENDENT_AMBULATORY_CARE_PROVIDER_SITE_OTHER): Payer: Medicare Other | Admitting: Orthopedic Surgery

## 2018-07-26 DIAGNOSIS — M1711 Unilateral primary osteoarthritis, right knee: Secondary | ICD-10-CM | POA: Diagnosis not present

## 2018-07-26 DIAGNOSIS — M1712 Unilateral primary osteoarthritis, left knee: Secondary | ICD-10-CM

## 2018-07-28 ENCOUNTER — Encounter (INDEPENDENT_AMBULATORY_CARE_PROVIDER_SITE_OTHER): Payer: Self-pay | Admitting: Orthopedic Surgery

## 2018-07-28 MED ORDER — HYALURONAN 88 MG/4ML IX SOSY
88.0000 mg | PREFILLED_SYRINGE | INTRA_ARTICULAR | Status: AC | PRN
Start: 2018-07-28 — End: 2018-07-28
  Administered 2018-07-28: 88 mg via INTRA_ARTICULAR

## 2018-07-28 MED ORDER — LIDOCAINE HCL 1 % IJ SOLN
5.0000 mL | INTRAMUSCULAR | Status: AC | PRN
  Administered 2018-07-28: 5 mL

## 2018-07-28 MED ORDER — HYALURONAN 88 MG/4ML IX SOSY
88.0000 mg | PREFILLED_SYRINGE | INTRA_ARTICULAR | Status: AC | PRN
  Administered 2018-07-28: 88 mg via INTRA_ARTICULAR

## 2018-07-28 NOTE — Progress Notes (Signed)
   Procedure Note  Patient: Carol Tucker             Date of Birth: [birth year]           MRN: [id number]             Visit Date: 07/26/2018  Procedures: Visit Diagnoses: Unilateral primary osteoarthritis, left knee  Unilateral primary osteoarthritis, right knee  Large Joint Inj: bilateral knee on 07/28/2018 10:32 AM Indications: diagnostic evaluation, joint swelling and pain Details: 18 G 1.5 in needle, superolateral approach  Arthrogram: No  Medications (Right): 5 mL lidocaine 1 %; 88 mg Hyaluronan 88 MG/4ML Medications (Left): 5 mL lidocaine 1 %; 88 mg Hyaluronan 88 MG/4ML Outcome: tolerated well, no immediate complications Procedure, treatment alternatives, risks and benefits explained, specific risks discussed. Consent was given by the patient. Immediately prior to procedure a time out was called to verify the correct patient, procedure, equipment, support staff and site/side marked as required. Patient was prepped and draped in the usual sterile fashion.

## 2018-08-21 ENCOUNTER — Other Ambulatory Visit: Payer: Self-pay | Admitting: Family Medicine

## 2018-08-23 NOTE — Telephone Encounter (Signed)
30 day refill courtesy  Requested Prescriptions  Pending Prescriptions Disp Refills  . pravastatin (PRAVACHOL) 40 MG tablet [Pharmacy Med Name: Pravastatin Sodium Oral Tablet 40 MG] 30 tablet 0    Sig: TAKE 1 TABLET BY MOUTH EVERY DAY     Cardiovascular:  Antilipid - Statins Failed - 08/21/2018 10:29 AM      Failed - Total Cholesterol in normal range and within 360 days    Cholesterol, Total  Date Value Ref Range Status  03/12/2017 179 100 - 199 mg/dL Final         Failed - LDL in normal range and within 360 days    LDL Calculated  Date Value Ref Range Status  03/12/2017 109 (H) 0 - 99 mg/dL Final         Failed - HDL in normal range and within 360 days    HDL  Date Value Ref Range Status  03/12/2017 46 >39 mg/dL Final         Failed - Triglycerides in normal range and within 360 days    Triglycerides  Date Value Ref Range Status  03/12/2017 119 0 - 149 mg/dL Final         Passed - Patient is not pregnant      Passed - Valid encounter within last 12 months    Recent Outpatient Visits          11 months ago Urinary tract infection without hematuria, site unspecified   [facility], Laurey Arrow, MD   1 year ago Acute leg pain, right   [facility], Laurey Arrow, MD   1 year ago Essential hypertension   [facility], Laurey Arrow, MD   1 year ago Medicare annual wellness visit, subsequent   [facility], Laurey Arrow, MD   2 years ago Chest tightness or pressure   [facility], Laurey Arrow, MD

## 2018-11-01 ENCOUNTER — Other Ambulatory Visit: Payer: Self-pay | Admitting: Ophthalmology

## 2019-02-02 ENCOUNTER — Telehealth: Payer: Self-pay

## 2019-02-02 NOTE — Telephone Encounter (Signed)
Submitted VOB for SynviscOne, bilateral knee. 

## 2019-02-03 ENCOUNTER — Telehealth: Payer: Self-pay

## 2019-02-03 NOTE — Telephone Encounter (Signed)
Approved for SynviscOne, bilateral knee. Buy & Bill Covered at 100% after Co-pay Co-pay of $90.00 required No PA required  Appt. 03/14/2019 with Dr. Marlou Sa

## 2019-03-14 ENCOUNTER — Other Ambulatory Visit: Payer: Self-pay

## 2019-03-14 ENCOUNTER — Telehealth: Payer: Self-pay

## 2019-03-14 ENCOUNTER — Ambulatory Visit (INDEPENDENT_AMBULATORY_CARE_PROVIDER_SITE_OTHER): Payer: Medicare Other | Admitting: Orthopedic Surgery

## 2019-03-14 ENCOUNTER — Encounter: Payer: Self-pay | Admitting: Orthopedic Surgery

## 2019-03-14 DIAGNOSIS — M1712 Unilateral primary osteoarthritis, left knee: Secondary | ICD-10-CM

## 2019-03-14 DIAGNOSIS — M1711 Unilateral primary osteoarthritis, right knee: Secondary | ICD-10-CM | POA: Diagnosis not present

## 2019-03-14 MED ORDER — LIDOCAINE HCL 1 % IJ SOLN
5.0000 mL | INTRAMUSCULAR | Status: AC | PRN
  Administered 2019-03-14: 5 mL

## 2019-03-14 MED ORDER — HYLAN G-F 20 48 MG/6ML IX SOSY
48.0000 mg | PREFILLED_SYRINGE | INTRA_ARTICULAR | Status: AC | PRN
  Administered 2019-03-14: 48 mg via INTRA_ARTICULAR

## 2019-03-14 NOTE — Telephone Encounter (Signed)
Can we get patient approved for bilat synvisc in 6 months.

## 2019-03-14 NOTE — Progress Notes (Signed)
   Procedure Note  Patient: Carol Tucker             Date of Birth: 1940-12-05           MRN: [id number]             Visit Date: 03/14/2019  Procedures: Visit Diagnoses:  1. Unilateral primary osteoarthritis, left knee   2. Unilateral primary osteoarthritis, right knee     Large Joint Inj: bilateral knee on 03/14/2019 2:45 PM Indications: pain, joint swelling and diagnostic evaluation Details: 18 G 1.5 in needle, superolateral approach  Arthrogram: No  Medications (Right): 5 mL lidocaine 1 %; 48 mg Hylan 48 MG/6ML Medications (Left): 5 mL lidocaine 1 %; 48 mg Hylan 48 MG/6ML Outcome: tolerated well, no immediate complications Procedure, treatment alternatives, risks and benefits explained, specific risks discussed. Consent was given by the patient. Immediately prior to procedure a time out was called to verify the correct patient, procedure, equipment, support staff and site/side marked as required. Patient was prepped and draped in the usual sterile fashion.    This patient is diagnosed with osteoarthritis of the knee(s).    Radiographs show evidence of joint space narrowing, osteophytes, subchondral sclerosis and/or subchondral cysts.  This patient has knee pain which interferes with functional and activities of daily living.    This patient has experienced inadequate response, adverse effects and/or intolerance with conservative treatments such as acetaminophen, NSAIDS, topical creams, physical therapy or regular exercise, knee bracing and/or weight loss.   This patient has experienced inadequate response or has a contraindication to intra articular steroid injections for at least 3 months.   This patient is not scheduled to have a total knee replacement within 6 months of starting treatment with viscosupplementation.

## 2019-03-18 NOTE — Telephone Encounter (Signed)
Noted.  Will submit the first week of January, 2021.

## 2019-08-26 ENCOUNTER — Telehealth: Payer: Self-pay

## 2019-08-26 NOTE — Telephone Encounter (Signed)
Submitted VOB for SynviscOne, bilateral knee. 

## 2019-08-29 ENCOUNTER — Telehealth: Payer: Self-pay

## 2019-08-29 NOTE — Telephone Encounter (Signed)
Will submit for Monovisc, bilateral knee due to insurance change to [facility] for 2021.  Submitted VOB for Monovisc, bilateral knee.

## 2019-09-01 ENCOUNTER — Telehealth: Payer: Self-pay

## 2019-09-01 NOTE — Telephone Encounter (Signed)
Approved for Monovisc, bilateral knee. Buy & Bill Once OOP is met, patient is covered at 100%. Co-pay of $40.00 No PA required  Appt. 10/31/2019 with Dr. Dean 

## 2019-09-14 ENCOUNTER — Ambulatory Visit: Payer: Medicare Other | Admitting: Orthopedic Surgery

## 2019-09-28 ENCOUNTER — Ambulatory Visit: Payer: Medicare Other | Admitting: Orthopedic Surgery

## 2019-10-31 ENCOUNTER — Ambulatory Visit: Payer: Medicare Other | Admitting: Orthopedic Surgery

## 2019-12-23 ENCOUNTER — Other Ambulatory Visit: Payer: Self-pay

## 2019-12-23 ENCOUNTER — Ambulatory Visit: Payer: Medicare PPO | Admitting: Surgical

## 2019-12-23 ENCOUNTER — Ambulatory Visit: Payer: Medicare Other | Admitting: Orthopedic Surgery

## 2019-12-23 ENCOUNTER — Encounter: Payer: Self-pay | Admitting: Surgical

## 2019-12-23 DIAGNOSIS — R200 Anesthesia of skin: Secondary | ICD-10-CM

## 2019-12-23 DIAGNOSIS — M1712 Unilateral primary osteoarthritis, left knee: Secondary | ICD-10-CM

## 2019-12-23 DIAGNOSIS — M1711 Unilateral primary osteoarthritis, right knee: Secondary | ICD-10-CM

## 2019-12-23 DIAGNOSIS — G5601 Carpal tunnel syndrome, right upper limb: Secondary | ICD-10-CM

## 2019-12-23 DIAGNOSIS — M170 Bilateral primary osteoarthritis of knee: Secondary | ICD-10-CM | POA: Diagnosis not present

## 2019-12-24 ENCOUNTER — Encounter: Payer: Self-pay | Admitting: Surgical

## 2019-12-24 DIAGNOSIS — M1712 Unilateral primary osteoarthritis, left knee: Secondary | ICD-10-CM

## 2019-12-24 DIAGNOSIS — M1711 Unilateral primary osteoarthritis, right knee: Secondary | ICD-10-CM

## 2019-12-24 DIAGNOSIS — M170 Bilateral primary osteoarthritis of knee: Secondary | ICD-10-CM | POA: Diagnosis not present

## 2019-12-24 MED ORDER — LIDOCAINE HCL 1 % IJ SOLN
5.0000 mL | INTRAMUSCULAR | Status: AC | PRN
  Administered 2019-12-24: 5 mL

## 2019-12-24 MED ORDER — HYALURONAN 88 MG/4ML IX SOSY
88.0000 mg | PREFILLED_SYRINGE | INTRA_ARTICULAR | Status: AC | PRN
Start: 2019-12-24 — End: 2019-12-24
  Administered 2019-12-24: 88 mg via INTRA_ARTICULAR

## 2019-12-24 MED ORDER — HYALURONAN 88 MG/4ML IX SOSY
88.0000 mg | PREFILLED_SYRINGE | INTRA_ARTICULAR | Status: AC | PRN
  Administered 2019-12-24: 88 mg via INTRA_ARTICULAR
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
## 2020-02-10 ENCOUNTER — Ambulatory Visit (INDEPENDENT_AMBULATORY_CARE_PROVIDER_SITE_OTHER): Payer: Medicare PPO | Admitting: Physical Medicine and Rehabilitation

## 2020-02-10 ENCOUNTER — Other Ambulatory Visit: Payer: Self-pay

## 2020-02-10 ENCOUNTER — Encounter: Payer: Self-pay | Admitting: Physical Medicine and Rehabilitation

## 2020-02-10 DIAGNOSIS — R202 Paresthesia of skin: Secondary | ICD-10-CM | POA: Diagnosis not present

## 2020-02-10 NOTE — Progress Notes (Signed)
Numbness in fingers of right hand at night and with driving. Wakes her up at night. Sometimes has to sleep sitting up with hand hanging down. Right hand dominant. First three fingers are the worst. Numeric Pain Rating Scale and Functional Assessment Average Pain 10   In the last MONTH (on 0-10 scale) has pain interfered with the following?  1. General activity like being  able to carry out your everyday physical activities such as walking, climbing stairs, carrying groceries, or moving a chair?  Rating(3)

## 2020-02-13 ENCOUNTER — Encounter: Payer: Self-pay | Admitting: Physical Medicine and Rehabilitation

## 2020-02-13 NOTE — Progress Notes (Signed)
Carol Tucker - 79 y.o. female MRN [id number]  Date of birth: 17-May-1941  Office Visit Note: Visit Date: 02/10/2020 PCP: Shawnee Knapp, MD Referred by: Shawnee Knapp, MD  Subjective: Chief Complaint  Patient presents with  . Right Hand - Numbness   HPI:  Carol Tucker is a 79 y.o. female who comes in today At the request of Annie Main, PA-C for electrodiagnostic study of the right upper limb. She reports a long history of carpal tunnel syndrome and in fact there are notes in the chart dating back to as far back as 2016 or before with toxic diagnoses of carpal tunnel syndrome. No prior electrodiagnostic study. She reports 10 out of 10 pain numbness and tingling in the right hand particularly at night and with driving. She does report nocturnal symptoms that wake her up at night. She reports sometimes having to sit up with her hand hanging down for relief. She is right-hand dominant and she does get worsening symptoms in the radial digits. She denies any real symptoms on the left. No frank radicular symptoms. No history of diabetes or peripheral polyneuropathy.  ROS Otherwise per HPI.  Assessment & Plan: Visit Diagnoses:  1. Paresthesia of skin     Plan: Impression: The above electrodiagnostic study is ABNORMAL and reveals evidence of a moderate to severe right median nerve entrapment at the wrist (carpal tunnel syndrome) affecting sensory and motor components.   There is no significant electrodiagnostic evidence of any other focal nerve entrapment, brachial plexopathy or cervical radiculopathy.   Recommendations: 1.  Follow-up with referring physician. 2.  Continue current management of symptoms. 3.  Continue use of resting splint at night-time and as needed during the day. 4.  Suggest surgical evaluation.  Meds & Orders: No orders of the defined types were placed in this encounter.   Orders Placed This Encounter  Procedures  . NCV with EMG (electromyography)      Follow-up: Return for [employer], PA-C.   Procedures: No procedures performed  EMG & NCV Findings: Evaluation of the right median motor nerve showed prolonged distal onset latency (7.4 ms) and decreased conduction velocity (Elbow-Wrist, 38 m/s).  The right median (across palm) sensory nerve showed prolonged distal peak latency (Wrist, 8.0 ms), reduced amplitude (7.8 V), and prolonged distal peak latency (Palm, 5.2 ms).  All remaining nerves (as indicated in the following tables) were within normal limits.    All examined muscles (as indicated in the following table) showed no evidence of electrical instability.    Impression: The above electrodiagnostic study is ABNORMAL and reveals evidence of a moderate to severe right median nerve entrapment at the wrist (carpal tunnel syndrome) affecting sensory and motor components.   There is no significant electrodiagnostic evidence of any other focal nerve entrapment, brachial plexopathy or cervical radiculopathy.   Recommendations: 1.  Follow-up with referring physician. 2.  Continue current management of symptoms. 3.  Continue use of resting splint at night-time and as needed during the day. 4.  Suggest surgical evaluation.  ___________________________ Laurence Spates FAAPMR Board Certified, American Board of Physical Medicine and Rehabilitation    Nerve Conduction Studies Anti Sensory Summary Table   Stim Site NR Peak (ms) Norm Peak (ms) P-T Amp (V) Norm P-T Amp Site1 Site2 Delta-P (ms) Dist (cm) Vel (m/s) Norm Vel (m/s)  Right Median Acr Palm Anti Sensory (2nd Digit)  31.7C  Wrist    *8.0 <3.6 *7.8 >10 Wrist Palm 2.8 0.0    Palm    *  5.2 <2.0 17.5         Right Radial Anti Sensory (Base 1st Digit)  30.8C  Wrist    2.5 <3.1 19.1  Wrist Base 1st Digit 2.5 0.0    Right Ulnar Anti Sensory (5th Digit)  31.2C  Wrist    3.6 <3.7 18.0 >15.0 Wrist 5th Digit 3.6 14.0 39 >38   Motor Summary Table   Stim Site NR Onset (ms) Norm Onset (ms)  O-P Amp (mV) Norm O-P Amp Site1 Site2 Delta-0 (ms) Dist (cm) Vel (m/s) Norm Vel (m/s)  Right Median Motor (Abd Poll Brev)  30.8C  Wrist    *7.4 <4.2 5.0 >5 Elbow Wrist 5.3 20.0 *38 >50  Elbow    12.7  3.1         Right Ulnar Motor (Abd Dig Min)  30.8C  Wrist    3.3 <4.2 7.6 >3 B Elbow Wrist 3.2 18.0 56 >53  B Elbow    6.5  7.2  A Elbow B Elbow 1.3 10.0 77 >53  A Elbow    7.8  6.9          EMG   Side Muscle Nerve Root Ins Act Fibs Psw Amp Dur Poly Recrt Int Fraser Din Comment  Right Abd Poll Brev Median C8-T1 Nml Nml Nml Nml Nml 0 Nml Nml   Right 1stDorInt Ulnar C8-T1 Nml Nml Nml Nml Nml 0 Nml Nml   Right PronatorTeres Median C6-7 Nml Nml Nml Nml Nml 0 Nml Nml   Right Biceps Musculocut C5-6 Nml Nml Nml Nml Nml 0 Nml Nml   Right Deltoid Axillary C5-6 Nml Nml Nml Nml Nml 0 Nml Nml     Nerve Conduction Studies Anti Sensory Left/Right Comparison   Stim Site L Lat (ms) R Lat (ms) L-R Lat (ms) L Amp (V) R Amp (V) L-R Amp (%) Site1 Site2 L Vel (m/s) R Vel (m/s) L-R Vel (m/s)  Median Acr Palm Anti Sensory (2nd Digit)  31.7C  Wrist  *8.0   *7.8  Wrist Palm     Palm  *5.2   17.5        Radial Anti Sensory (Base 1st Digit)  30.8C  Wrist  2.5   19.1  Wrist Base 1st Digit     Ulnar Anti Sensory (5th Digit)  31.2C  Wrist  3.6   18.0  Wrist 5th Digit  39    Motor Left/Right Comparison   Stim Site L Lat (ms) R Lat (ms) L-R Lat (ms) L Amp (mV) R Amp (mV) L-R Amp (%) Site1 Site2 L Vel (m/s) R Vel (m/s) L-R Vel (m/s)  Median Motor (Abd Poll Brev)  30.8C  Wrist  *7.4   5.0  Elbow Wrist  *38   Elbow  12.7   3.1        Ulnar Motor (Abd Dig Min)  30.8C  Wrist  3.3   7.6  B Elbow Wrist  56   B Elbow  6.5   7.2  A Elbow B Elbow  77   A Elbow  7.8   6.9           Waveforms:             Clinical History: No specialty comments available.     Objective:  VS:  HT:    WT:   BMI:     BP:   HR: bpm  TEMP: ( )  RESP:  Physical Exam Musculoskeletal:        General:  No swelling,  tenderness or deformity.     Comments: Inspection reveals no atrophy of the bilateral APB or FDI or hand intrinsics. There is no swelling, color changes, allodynia or dystrophic changes. There is 5 out of 5 strength in the bilateral wrist extension, finger abduction and long finger flexion. There is intact sensation to light touch in all dermatomal and peripheral nerve distributions. . There is a positive Phalen's test on the right. There is a negative Hoffmann's test bilaterally.  Skin:    General: Skin is warm and dry.     Findings: No erythema or rash.  Neurological:     General: No focal deficit present.     Mental Status: She is alert and oriented to person, place, and time.     Motor: No weakness or abnormal muscle tone.     Coordination: Coordination normal.  Psychiatric:        Mood and Affect: Mood normal.        Behavior: Behavior normal.      Imaging: No results found.

## 2020-02-13 NOTE — Procedures (Signed)
EMG & NCV Findings: Evaluation of the right median motor nerve showed prolonged distal onset latency (7.4 ms) and decreased conduction velocity (Elbow-Wrist, 38 m/s).  The right median (across palm) sensory nerve showed prolonged distal peak latency (Wrist, 8.0 ms), reduced amplitude (7.8 V), and prolonged distal peak latency (Palm, 5.2 ms).  All remaining nerves (as indicated in the following tables) were within normal limits.    All examined muscles (as indicated in the following table) showed no evidence of electrical instability.    Impression: The above electrodiagnostic study is ABNORMAL and reveals evidence of a moderate to severe right median nerve entrapment at the wrist (carpal tunnel syndrome) affecting sensory and motor components.   There is no significant electrodiagnostic evidence of any other focal nerve entrapment, brachial plexopathy or cervical radiculopathy.   Recommendations: 1.  Follow-up with referring physician. 2.  Continue current management of symptoms. 3.  Continue use of resting splint at night-time and as needed during the day. 4.  Suggest surgical evaluation.  ___________________________ Laurence Spates FAAPMR Board Certified, American Board of Physical Medicine and Rehabilitation    Nerve Conduction Studies Anti Sensory Summary Table   Stim Site NR Peak (ms) Norm Peak (ms) P-T Amp (V) Norm P-T Amp Site1 Site2 Delta-P (ms) Dist (cm) Vel (m/s) Norm Vel (m/s)  Right Median Acr Palm Anti Sensory (2nd Digit)  31.7C  Wrist    *8.0 <3.6 *7.8 >10 Wrist Palm 2.8 0.0    Palm    *5.2 <2.0 17.5         Right Radial Anti Sensory (Base 1st Digit)  30.8C  Wrist    2.5 <3.1 19.1  Wrist Base 1st Digit 2.5 0.0    Right Ulnar Anti Sensory (5th Digit)  31.2C  Wrist    3.6 <3.7 18.0 >15.0 Wrist 5th Digit 3.6 14.0 39 >38   Motor Summary Table   Stim Site NR Onset (ms) Norm Onset (ms) O-P Amp (mV) Norm O-P Amp Site1 Site2 Delta-0 (ms) Dist (cm) Vel (m/s) Norm Vel (m/s)    Right Median Motor (Abd Poll Brev)  30.8C  Wrist    *7.4 <4.2 5.0 >5 Elbow Wrist 5.3 20.0 *38 >50  Elbow    12.7  3.1         Right Ulnar Motor (Abd Dig Min)  30.8C  Wrist    3.3 <4.2 7.6 >3 B Elbow Wrist 3.2 18.0 56 >53  B Elbow    6.5  7.2  A Elbow B Elbow 1.3 10.0 77 >53  A Elbow    7.8  6.9          EMG   Side Muscle Nerve Root Ins Act Fibs Psw Amp Dur Poly Recrt Int Fraser Din Comment  Right Abd Poll Brev Median C8-T1 Nml Nml Nml Nml Nml 0 Nml Nml   Right 1stDorInt Ulnar C8-T1 Nml Nml Nml Nml Nml 0 Nml Nml   Right PronatorTeres Median C6-7 Nml Nml Nml Nml Nml 0 Nml Nml   Right Biceps Musculocut C5-6 Nml Nml Nml Nml Nml 0 Nml Nml   Right Deltoid Axillary C5-6 Nml Nml Nml Nml Nml 0 Nml Nml     Nerve Conduction Studies Anti Sensory Left/Right Comparison   Stim Site L Lat (ms) R Lat (ms) L-R Lat (ms) L Amp (V) R Amp (V) L-R Amp (%) Site1 Site2 L Vel (m/s) R Vel (m/s) L-R Vel (m/s)  Median Acr Palm Anti Sensory (2nd Digit)  31.7C  Wrist  *8.0   *  7.8  Wrist Palm     Palm  *5.2   17.5        Radial Anti Sensory (Base 1st Digit)  30.8C  Wrist  2.5   19.1  Wrist Base 1st Digit     Ulnar Anti Sensory (5th Digit)  31.2C  Wrist  3.6   18.0  Wrist 5th Digit  39    Motor Left/Right Comparison   Stim Site L Lat (ms) R Lat (ms) L-R Lat (ms) L Amp (mV) R Amp (mV) L-R Amp (%) Site1 Site2 L Vel (m/s) R Vel (m/s) L-R Vel (m/s)  Median Motor (Abd Poll Brev)  30.8C  Wrist  *7.4   5.0  Elbow Wrist  *38   Elbow  12.7   3.1        Ulnar Motor (Abd Dig Min)  30.8C  Wrist  3.3   7.6  B Elbow Wrist  56   B Elbow  6.5   7.2  A Elbow B Elbow  77   A Elbow  7.8   6.9           Waveforms:

## 2020-03-09 ENCOUNTER — Ambulatory Visit (INDEPENDENT_AMBULATORY_CARE_PROVIDER_SITE_OTHER): Payer: Medicare PPO | Admitting: Orthopedic Surgery

## 2020-03-09 ENCOUNTER — Encounter: Payer: Self-pay | Admitting: Orthopedic Surgery

## 2020-03-09 VITALS — Ht 64.0 in | Wt 143.0 lb

## 2020-03-09 DIAGNOSIS — G5601 Carpal tunnel syndrome, right upper limb: Secondary | ICD-10-CM

## 2020-03-09 DIAGNOSIS — M170 Bilateral primary osteoarthritis of knee: Secondary | ICD-10-CM | POA: Diagnosis not present

## 2020-03-09 DIAGNOSIS — M1711 Unilateral primary osteoarthritis, right knee: Secondary | ICD-10-CM

## 2020-03-09 DIAGNOSIS — M1712 Unilateral primary osteoarthritis, left knee: Secondary | ICD-10-CM

## 2020-03-10 ENCOUNTER — Encounter: Payer: Self-pay | Admitting: Orthopedic Surgery

## 2020-03-10 NOTE — Progress Notes (Signed)
Office Visit Note   Patient: Carol Tucker           Date of Birth: May 06, 1941           MRN: [id number] Visit Date: 03/09/2020 Requested by: Carol Knapp, MD [place],  [place] [id number] PCP: Carol Knapp, MD  Subjective: Chief Complaint  Patient presents with  . Right Hand - Numbness    HPI: Carol Tucker is a 79 year old patient with bilateral knee arthritis.  She is living in [place].  She would like to be able to walk on the beach but has a difficult time doing that because of her arthritis.  She also has known right hand carpal tunnel syndrome.  She had EMG nerve study with Dr. Ernestina Tucker and that is reviewed.  She has moderate to severe right-sided carpal tunnel syndrome.  This does keep her awake at night.  She reports numbness as well as some loss of dexterity.  She wears a wrist splint at night.  She is right-hand dominant.  Gel injections have not been helping her knees.              ROS: All systems reviewed are negative as they relate to the chief complaint within the history of present illness.  Patient denies  fevers or chills.   Assessment & Plan: Visit Diagnoses:  1. Carpal tunnel syndrome, right upper limb   2. Unilateral primary osteoarthritis, left knee   3. Unilateral primary osteoarthritis, right knee     Plan: Impression is symptomatic right carpal tunnel syndrome.  Plan is carpal tunnel release.  Risk benefits are discussed include not limited to infection nerve vessel damage delayed restoration of improved function and decreased numbness.  Patient understands risk benefits.  She would also like to talk about getting her knees replaced.  I think that is good to be a good option once her hand recovers.  She will have to do some weightbearing through a walker as part of the rehabilitative process.  All this is discussed with the patient.  All questions answered.  We could aim for knee replacement potentially sometime in September or October.  Follow-Up  Instructions: No follow-ups on file.   Orders:  No orders of the defined types were placed in this encounter.  No orders of the defined types were placed in this encounter.     Procedures: No procedures performed   Clinical Data: No additional findings.  Objective: Vital Signs: Ht 5\' 4"  (1.626 m)   Wt 143 lb (64.9 kg)   BMI 24.55 kg/m   Physical Exam:   Constitutional: Patient appears well-developed HEENT:  Head: Normocephalic Eyes:EOM are normal Neck: Normal range of motion Cardiovascular: Normal rate Pulmonary/chest: Effort normal Neurologic: Patient is alert Skin: Skin is warm Psychiatric: Patient has normal mood and affect    Ortho Exam: Ortho exam demonstrates pretty reasonable range of motion in both knees with global tenderness and trace effusion.  Patellofemoral crepitus is present.  Not much pitting edema in the legs and pedal pulses are palpable.  She has symmetric grip strength bilaterally with no abductor pollicis brevis wasting and good strength in that muscle.  Radial pulses palpable.  Negative Tinel's cubital tunnel at the elbow.  Specialty Comments:  No specialty comments available.  Imaging: No results found.   PMFS History: Patient Active Problem List   Diagnosis Date Noted  . Vitamin D deficiency 03/10/2017  . Chronic pain of right knee 10/08/2016  . Chronic pain  of left knee 10/08/2016  . Atypical chest pain 07/07/2016  . Prediabetes 12/20/2015  . Microscopic hematuria 01/26/2015  . Polypharmacy 01/26/2015  . Carpal tunnel syndrome of right wrist 01/26/2015  . Panic attack 01/26/2015  . Migraine 04/05/2012  . Hyperlipidemia   . GERD (gastroesophageal reflux disease)   . Osteopenia   . HTN (hypertension) 11/04/2011   Past Medical History:  Diagnosis Date  . Allergy   . Anxiety   . Cancer ([place])   . Depression   . GERD (gastroesophageal reflux disease)   . Hyperlipidemia   . Hypertension   . Osteopenia 8-04   DEXA at [facility]  04/14/14 with lowest Tscore -1.7 at Left femoral neck. FRAX does not indicate need for bisphosphonate but density has sig worsened since prior dexa on 12/17/2009  . Prediabetes     Family History  Problem Relation Age of Onset  . Hypertension Father   . Heart disease Father   . Cancer Father        BLADDER & LUNG  . Heart disease Paternal Grandmother   . Alzheimer's disease Mother   . Heart disease Brother   . Heart disease Paternal Grandfather   . Diabetes Paternal Grandfather   . Colon cancer Neg Hx   . Pancreatic cancer Neg Hx   . Stomach cancer Neg Hx     Past Surgical History:  Procedure Laterality Date  . APPENDECTOMY    . DILATION AND CURETTAGE OF UTERUS  1969   MISSED AB  . KNEE ARTHROSCOPY  2007  . 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 TUBAL LIGATION  1980  . tumor removal salivary gland     Social History   Occupational History  . Not on file  Tobacco Use  . Smoking status: Former Research scientist (life sciences)  . Smokeless tobacco: Never Used  Substance and Sexual Activity  . Alcohol use: Yes    Comment: Rare  . Drug use: No    Frequency: 3.0 times per week  . Sexual activity: Never

## 2020-04-16 ENCOUNTER — Other Ambulatory Visit: Payer: Self-pay | Admitting: Surgical

## 2020-04-16 DIAGNOSIS — G5601 Carpal tunnel syndrome, right upper limb: Secondary | ICD-10-CM | POA: Diagnosis not present

## 2020-04-16 MED ORDER — TRAMADOL HCL 50 MG PO TABS: 50.0000 mg | ORAL_TABLET | Freq: Four times a day (QID) | ORAL | 0 refills | Status: DC | PRN

## 2020-04-16 MED ORDER — METHOCARBAMOL 500 MG PO TABS: 500.0000 mg | ORAL_TABLET | Freq: Three times a day (TID) | ORAL | 0 refills | Status: DC | PRN

## 2020-04-25 ENCOUNTER — Ambulatory Visit (INDEPENDENT_AMBULATORY_CARE_PROVIDER_SITE_OTHER): Payer: Medicare PPO | Admitting: Orthopedic Surgery

## 2020-04-25 DIAGNOSIS — G5601 Carpal tunnel syndrome, right upper limb: Secondary | ICD-10-CM

## 2020-04-29 ENCOUNTER — Encounter: Payer: Self-pay | Admitting: Orthopedic Surgery

## 2020-04-29 NOTE — Progress Notes (Signed)
Post-Op Visit Note   Patient: Carol Tucker           Date of Birth: 08-Mar-1941           MRN: [id number] Visit Date: 04/25/2020 PCP: Shawnee Knapp, MD   Assessment & Plan:  Chief Complaint:  Chief Complaint  Patient presents with  . Post-op Follow-up   Visit Diagnoses:  1. Carpal tunnel syndrome, right upper limb     Plan: Patient is a 79 year old female presents s/p right carpal tunnel release on 04/16/2020.  She is doing well overall.  She is 10 days out from procedure.  Denies any fevers, chills, night sweats, drainage.  She had to take tramadol for about 2 days but now she is not taking any medication for pain control.  Denies any numbness/tingling and she states that her right hand feels significantly better compared with preoperatively.  Sutures are intact and every other sutures removed today but the other sutures were left alone as the incision seem to opening a bit.  Plan for patient to have these sutures removed by her primary care physician in about a week.  Patient agreed with this plan.  She will contact her PCP.  She lives in [place] so coming back to the office would be quite a trip for her.  Plan for her to follow-up in October.  At that time we will obtain radiographs of her bilateral knees in preparation for the upcoming knee replacement surgery in 2022 that she wishes to proceed with.  Follow-Up Instructions: No follow-ups on file.   Orders:  No orders of the defined types were placed in this encounter.  No orders of the defined types were placed in this encounter.   Imaging: No results found.  PMFS History: Patient Active Problem List   Diagnosis Date Noted  . Vitamin D deficiency 03/10/2017  . Chronic pain of right knee 10/08/2016  . Chronic pain of left knee 10/08/2016  . Atypical chest pain 07/07/2016  . Prediabetes 12/20/2015  . Microscopic hematuria 01/26/2015  . Polypharmacy 01/26/2015  . Carpal tunnel syndrome of right wrist 01/26/2015    . Panic attack 01/26/2015  . Migraine 04/05/2012  . Hyperlipidemia   . GERD (gastroesophageal reflux disease)   . Osteopenia   . HTN (hypertension) 11/04/2011   Past Medical History:  Diagnosis Date  . Allergy   . Anxiety   . Cancer ([place])   . Depression   . GERD (gastroesophageal reflux disease)   . Hyperlipidemia   . Hypertension   . Osteopenia 8-04   DEXA at [facility] 04/14/14 with lowest Tscore -1.7 at Left femoral neck. FRAX does not indicate need for bisphosphonate but density has sig worsened since prior dexa on 12/17/2009  . Prediabetes     Family History  Problem Relation Age of Onset  . Hypertension Father   . Heart disease Father   . Cancer Father        BLADDER & LUNG  . Heart disease Paternal Grandmother   . Alzheimer's disease Mother   . Heart disease Brother   . Heart disease Paternal Grandfather   . Diabetes Paternal Grandfather   . Colon cancer Neg Hx   . Pancreatic cancer Neg Hx   . Stomach cancer Neg Hx     Past Surgical History:  Procedure Laterality Date  . APPENDECTOMY    . DILATION AND CURETTAGE OF UTERUS  1969   MISSED AB  . KNEE ARTHROSCOPY  2007  . NECK  SURGERY  2008   NECK TUMOR  . TUBAL LIGATION  1980  . tumor removal salivary gland     Social History   Occupational History  . Not on file  Tobacco Use  . Smoking status: Former Research scientist (life sciences)  . Smokeless tobacco: Never Used  Substance and Sexual Activity  . Alcohol use: Yes    Comment: Rare  . Drug use: No    Frequency: 3.0 times per week  . Sexual activity: Never

## 2020-06-11 ENCOUNTER — Ambulatory Visit: Payer: Self-pay

## 2020-06-11 ENCOUNTER — Ambulatory Visit (INDEPENDENT_AMBULATORY_CARE_PROVIDER_SITE_OTHER): Payer: Medicare PPO | Admitting: Orthopedic Surgery

## 2020-06-11 ENCOUNTER — Ambulatory Visit (INDEPENDENT_AMBULATORY_CARE_PROVIDER_SITE_OTHER): Payer: Medicare PPO

## 2020-06-11 DIAGNOSIS — M1712 Unilateral primary osteoarthritis, left knee: Secondary | ICD-10-CM

## 2020-06-11 DIAGNOSIS — M1711 Unilateral primary osteoarthritis, right knee: Secondary | ICD-10-CM

## 2020-06-11 DIAGNOSIS — M170 Bilateral primary osteoarthritis of knee: Secondary | ICD-10-CM | POA: Diagnosis not present

## 2020-06-16 ENCOUNTER — Encounter: Payer: Self-pay | Admitting: Orthopedic Surgery

## 2020-06-16 NOTE — Progress Notes (Signed)
Office Visit Note   Patient: Carol Tucker           Date of Birth: [birth year]           MRN: [id number] Visit Date: 06/11/2020 Requested by: Shawnee Knapp, MD [place],  [place] [address] PCP: Shawnee Knapp, MD  Subjective: Chief Complaint  Patient presents with  . Left Knee - Pain  . Right Knee - Pain    HPI: Carol Tucker is a 79 y.o. female who presents to the office complaining of bilateral knee pain.  Patient has history of bilateral knee osteoarthritis.  She states that she has knee pain with swelling that keeps her from doing things she wants to do like walking on the beach or walking on any uneven surfaces.  She takes occasional ibuprofen with out any significant relief.  Last injection into the knee provided about 1 week of relief before wearing off.  She denies any groin pain or radicular pain.  She uses stationary bike for exercise.  She occasionally wakes with pain about 4 out of 7 nights out of the week.  She has more swelling on the right knee compared with the left knee but pain is equal between the right and left knees.  Patient is also s/p right carpal tunnel release on 04/16/2020.  Incision is healing well.  She has no recurrence of numbness or tingling.  She does not wake with pain any longer from her right wrist/hand.  She does note occasional aching over the incision but this continues to improve.  Overall she is pleased with the outcome from surgery.  She denies any cardiac history, history of blood clot in the family, diabetes, use of blood thinners..                ROS: All systems reviewed are negative as they relate to the chief complaint within the history of present illness.  Patient denies fevers or chills.  Assessment & Plan: Visit Diagnoses:  1. Unilateral primary osteoarthritis, left knee   2. Unilateral primary osteoarthritis, right knee     Plan: Patient is a 79 year old female presents complaint of bilateral knee pain primarily.   She has pain that wakes her up at night 4/7 nights out of the week.  She has been receiving injections every several months with the last injection only provided about 1 week of relief.  She has demonstrated radiographic evidence of knee arthritis with symptoms that keep her from doing things she wants to do.  She wished to proceed with right total knee arthroplasty in February 2022.  Discussed the risks and benefits of the procedure including risk of knee stiffness, infection, nerve and vessel damage, need for revision surgery.  Discussed the intense rehabilitation process and patient understands the level of pain involved with this procedure.  She still wishes to proceed with surgery.  She will be scheduled for right total knee arthroplasty in February.  Follow-up after procedure.  New radiographs were taken today as an update from her prior radiographs done several years ago.  Follow-Up Instructions: No follow-ups on file.   Orders:  Orders Placed This Encounter  Procedures  . XR Knee 1-2 Views Right  . XR KNEE 3 VIEW LEFT   No orders of the defined types were placed in this encounter.     Procedures: No procedures performed   Clinical Data: No additional findings.  Objective: Vital Signs: There were no vitals taken for this  visit.  Physical Exam:  Constitutional: Patient appears well-developed HEENT:  Head: Normocephalic Eyes:EOM are normal Neck: Normal range of motion Cardiovascular: Normal rate Pulmonary/chest: Effort normal Neurologic: Patient is alert Skin: Skin is warm Psychiatric: Patient has normal mood and affect  Ortho Exam: Ortho exam demonstrates right knee with 5 degrees extension and 115 degrees of flexion..  Left knee extends to 0 degrees and flexes to about 115 degrees.  Mild to moderate tenderness over the medial lateral joint lines of bilateral knees.  No pain with hip range of motion bilaterally.  Small effusion noted of the right knee but not present in the  left knee.  No calf tenderness bilaterally.  Negative Homan sign bilaterally.  Able to perform straight leg raise of both legs.  Right wrist incision from prior carpal tunnel release has healed well without any dehiscence or infection infection signs.  Sensation intact through all fingers of the right hand.  No thenar muscle wasting.  No tenderness significantly over the incision  Specialty Comments:  No specialty comments available.  Imaging: No results found.   PMFS History: Patient Active Problem List   Diagnosis Date Noted  . Vitamin D deficiency 03/10/2017  . Chronic pain of right knee 10/08/2016  . Chronic pain of left knee 10/08/2016  . Atypical chest pain 07/07/2016  . Prediabetes 12/20/2015  . Microscopic hematuria 01/26/2015  . Polypharmacy 01/26/2015  . Carpal tunnel syndrome of right wrist 01/26/2015  . Panic attack 01/26/2015  . Migraine 04/05/2012  . Hyperlipidemia   . GERD (gastroesophageal reflux disease)   . Osteopenia   . HTN (hypertension) 11/04/2011   Past Medical History:  Diagnosis Date  . Allergy   . Anxiety   . Cancer ([place])   . Depression   . GERD (gastroesophageal reflux disease)   . Hyperlipidemia   . Hypertension   . Osteopenia 8-04   DEXA at [facility] 04/14/14 with lowest Tscore -1.7 at Left femoral neck. FRAX does not indicate need for bisphosphonate but density has sig worsened since prior dexa on 12/17/2009  . Prediabetes     Family History  Problem Relation Age of Onset  . Hypertension Father   . Heart disease Father   . Cancer Father        BLADDER & LUNG  . Heart disease Paternal Grandmother   . Alzheimer's disease Mother   . Heart disease Brother   . Heart disease Paternal Grandfather   . Diabetes Paternal Grandfather   . Colon cancer Neg Hx   . Pancreatic cancer Neg Hx   . Stomach cancer Neg Hx     Past Surgical History:  Procedure Laterality Date  . APPENDECTOMY    . DILATION AND CURETTAGE OF UTERUS  1969   MISSED AB  . KNEE  ARTHROSCOPY  2007  . 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 TUBAL LIGATION  1980  . tumor removal salivary gland     Social History   Occupational History  . Not on file  Tobacco Use  . Smoking status: Former Research scientist (life sciences)  . Smokeless tobacco: Never Used  Substance and Sexual Activity  . Alcohol use: Yes    Comment: Rare  . Drug use: No    Frequency: 3.0 times per week  . Sexual activity: Never

## 2020-06-17 ENCOUNTER — Encounter: Payer: Self-pay | Admitting: Orthopedic Surgery

## 2020-09-19 ENCOUNTER — Other Ambulatory Visit (HOSPITAL_COMMUNITY): Payer: Medicare PPO

## 2020-09-24 ENCOUNTER — Other Ambulatory Visit (HOSPITAL_COMMUNITY): Payer: Medicare PPO

## 2020-10-04 ENCOUNTER — Inpatient Hospital Stay: Payer: Medicare PPO | Admitting: Orthopedic Surgery

## 2020-10-11 ENCOUNTER — Inpatient Hospital Stay: Payer: Medicare PPO | Admitting: Orthopedic Surgery

## 2020-12-06 NOTE — Progress Notes (Signed)
Surgical Instructions    Your procedure is scheduled on 12/11/20.  Report to [facility] Main Entrance "A" at 05:30 A.M., then check in with the Admitting office.  Call this number if you have problems the morning of surgery:  [phone]   If you have any questions prior to your surgery date call [phone]: Open Monday-Friday 8am-4pm    Remember:  Do not eat after midnight the night before your surgery  You may drink clear liquids until 04:30am the morning of your surgery.   Clear liquids allowed are: Water, Non-Citrus Juices (without pulp), Carbonated Beverages, Clear Tea, Black Coffee Only, and Gatorade  Patient Instructions  . The night before surgery:  o No food after midnight. ONLY clear liquids after midnight  . The day of surgery (if you do NOT have diabetes):  o Drink ONE (1) Pre-Surgery Clear Ensure by 04:30am the morning of surgery. Drink in one sitting. Do not sip.  o This drink was given to you during your hospital  pre-op appointment visit.  o Nothing else to drink after completing the  Pre-Surgery Clear Ensure.      Take these medicines the morning of surgery with A SIP OF WATER  famotidine (PEPCID) if needed ketotifen (ALAWAY) eye drops if needede pravastatin (PRAVACHOL)    As of today, STOP taking any Aspirin (unless otherwise instructed by your surgeon) Aleve, Naproxen, Ibuprofen, Motrin, Advil, Goody's, BC's, all herbal medications, fish oil, and all vitamins.                     Do not wear jewelry, make up, or nail polish            Do not wear lotions, powders, perfumes/colognes, or deodorant.            Do not shave 48 hours prior to surgery.              Do not bring valuables to the hospital.            [facility] is not responsible for any belongings or valuables.  Do NOT Smoke (Tobacco/Vaping) or drink Alcohol 24 hours prior to your procedure If you use a CPAP at night, you may bring all equipment for your overnight stay.   Contacts,  glasses, dentures or bridgework may not be worn into surgery, please bring cases for these belongings   For patients admitted to the hospital, discharge time will be determined by your treatment team.   Patients discharged the day of surgery will not be allowed to drive home, and someone needs to stay with them for 24 hours.    Special instructions:   [place]- Preparing For Surgery  Before surgery, you can play an important role. Because skin is not sterile, your skin needs to be as free of germs as possible. You can reduce the number of germs on your skin by washing with CHG (chlorahexidine gluconate) Soap before surgery.  CHG is an antiseptic cleaner which kills germs and bonds with the skin to continue killing germs even after washing.    Oral Hygiene is also important to reduce your risk of infection.  Remember - BRUSH YOUR TEETH THE MORNING OF SURGERY WITH YOUR REGULAR TOOTHPASTE  Please do not use if you have an allergy to CHG or antibacterial soaps. If your skin becomes reddened/irritated stop using the CHG.  Do not shave (including legs and underarms) for at least 48 hours prior to first CHG shower. It is OK  to shave your face.  Please follow these instructions carefully.   1. Shower the NIGHT BEFORE SURGERY and the MORNING OF SURGERY  2. If you chose to wash your hair, wash your hair first as usual with your normal shampoo.  3. After you shampoo, rinse your hair and body thoroughly to remove the shampoo.  4. Wash Face and genitals (private parts) with your normal soap.   5.  Shower the NIGHT BEFORE SURGERY and the MORNING OF SURGERY with CHG Soap.   6. Use CHG Soap as you would any other liquid soap. You can apply CHG directly to the skin and wash gently with a scrungie or a clean washcloth.   7. Apply the CHG Soap to your body ONLY FROM THE NECK DOWN.  Do not use on open wounds or open sores. Avoid contact with your eyes, ears, mouth and genitals (private parts). Wash  Face and genitals (private parts)  with your normal soap.   8. Wash thoroughly, paying special attention to the area where your surgery will be performed.  9. Thoroughly rinse your body with warm water from the neck down.  10. DO NOT shower/wash with your normal soap after using and rinsing off the CHG Soap.  11. Pat yourself dry with a CLEAN TOWEL.  12. Wear CLEAN PAJAMAS to bed the night before surgery  13. Place CLEAN SHEETS on your bed the night before your surgery  14. DO NOT SLEEP WITH PETS.   Day of Surgery: Take a shower with CHG soap. Wear Clean/Comfortable clothing the morning of surgery Do not apply any deodorants/lotions.   Remember to brush your teeth WITH YOUR REGULAR TOOTHPASTE.   Please read over the following fact sheets that you were given.

## 2020-12-07 ENCOUNTER — Other Ambulatory Visit: Payer: Self-pay

## 2020-12-07 ENCOUNTER — Encounter (HOSPITAL_COMMUNITY): Payer: Self-pay | Admitting: Orthopedic Surgery

## 2020-12-07 ENCOUNTER — Encounter (HOSPITAL_COMMUNITY): Payer: Self-pay

## 2020-12-07 ENCOUNTER — Encounter (HOSPITAL_COMMUNITY)
Admission: RE | Admit: 2020-12-07 | Discharge: 2020-12-07 | Disposition: A | Discharge status: Home / Self Care | Payer: Medicare PPO | Source: Ambulatory Visit | Attending: Orthopedic Surgery | Admitting: Orthopedic Surgery

## 2020-12-07 ENCOUNTER — Other Ambulatory Visit (HOSPITAL_COMMUNITY): Payer: Medicare PPO

## 2020-12-07 DIAGNOSIS — Z01818 Encounter for other preprocedural examination: Secondary | ICD-10-CM | POA: Diagnosis not present

## 2020-12-07 DIAGNOSIS — Z20822 Contact with and (suspected) exposure to covid-19: Secondary | ICD-10-CM | POA: Insufficient documentation

## 2020-12-07 HISTORY — DX: Prediabetes: R73.03

## 2020-12-07 HISTORY — DX: Unspecified osteoarthritis, unspecified site: M19.90

## 2020-12-07 LAB — BASIC METABOLIC PANEL
Anion gap: 7 (ref 5–15)
BUN: 18 mg/dL (ref 8–23)
CO2: 25 mmol/L (ref 22–32)
Calcium: 9.5 mg/dL (ref 8.9–10.3)
Chloride: 106 mmol/L (ref 98–111)
Creatinine, Ser: 0.91 mg/dL (ref 0.44–1.00)
GFR, Estimated: >60 mL/min (ref >60)
Glucose, Bld: 102 mg/dL — ABNORMAL HIGH (ref 70–99)
Potassium: 4.6 mmol/L (ref 3.5–5.1)
Sodium: 138 mmol/L (ref 135–145)

## 2020-12-07 LAB — CBC
HCT: 34.7 % — ABNORMAL LOW (ref 36.0–46.0)
Hemoglobin: 12.2 g/dL (ref 12.0–15.0)
MCH: 34.5 pg — ABNORMAL HIGH (ref 26.0–34.0)
MCHC: 35.2 g/dL (ref 30.0–36.0)
MCV: 98 fL (ref 80.0–100.0)
Platelets: 255 10*3/uL (ref 150–400)
RBC: 3.54 MIL/uL — ABNORMAL LOW (ref 3.87–5.11)
RDW: 13.2 % (ref 11.5–15.5)
WBC: 6.9 10*3/uL (ref 4.0–10.5)
nRBC: 0 % (ref 0.0–0.2)

## 2020-12-07 LAB — URINALYSIS, ROUTINE W REFLEX MICROSCOPIC
Bilirubin Urine: NEGATIVE
Glucose, UA: NEGATIVE mg/dL
Hgb urine dipstick: NEGATIVE
Ketones, ur: NEGATIVE mg/dL
Leukocytes,Ua: NEGATIVE
Nitrite: NEGATIVE
Protein, ur: NEGATIVE mg/dL
Specific Gravity, Urine: 1.004 — ABNORMAL LOW (ref 1.005–1.030)
pH: 5 (ref 5.0–8.0)

## 2020-12-07 LAB — SURGICAL PCR SCREEN
MRSA, PCR: NEGATIVE
Staphylococcus aureus: NEGATIVE

## 2020-12-07 LAB — SARS CORONAVIRUS 2 (TAT 6-24 HRS): SARS Coronavirus 2: NEGATIVE

## 2020-12-07 NOTE — Progress Notes (Signed)
PCP -  Acquanetta Sit, MD @ [facility] Requested most recent office notes and labs from PCP.  Cardiologist - Mertie Moores  Chest x-ray -  n/a EKG - 12/07/20 Stress Test - 5/21/20210 ECHO - denies Cardiac Cath - denies  Sleep Study- n/a   Pre-diabetes/ pt.states she does not monitor blood sugars.    ERAS Protcol - Yes PRE-SURGERY Ensure or G2- Ensure  COVID TEST- 12/07/20   Anesthesia review: No  Patient denies shortness of breath, fever, cough and chest pain at PAT appointment   All instructions explained to the patient, with a verbal understanding of the material. Patient agrees to go over the instructions while at home for a better understanding. Patient also instructed to self quarantine after being tested for COVID-19. The opportunity to ask questions was provided.

## 2020-12-08 LAB — URINE CULTURE: Culture: <10000 — AB

## 2020-12-10 ENCOUNTER — Other Ambulatory Visit: Payer: Self-pay

## 2020-12-11 ENCOUNTER — Ambulatory Visit (HOSPITAL_COMMUNITY): Payer: Medicare PPO | Admitting: Anesthesiology

## 2020-12-11 ENCOUNTER — Observation Stay (HOSPITAL_COMMUNITY)
Admission: RE | Admit: 2020-12-11 | Discharge: 2020-12-13 | Disposition: A | Discharge status: Home / Self Care | Payer: Medicare PPO | Attending: Orthopedic Surgery | Admitting: Orthopedic Surgery

## 2020-12-11 ENCOUNTER — Other Ambulatory Visit: Payer: Self-pay

## 2020-12-11 ENCOUNTER — Encounter (HOSPITAL_COMMUNITY)
Admission: RE | Disposition: A | Discharge status: Home / Self Care | Payer: Self-pay | Source: Home / Self Care | Attending: Orthopedic Surgery

## 2020-12-11 ENCOUNTER — Encounter (HOSPITAL_COMMUNITY): Payer: Self-pay | Admitting: Orthopedic Surgery

## 2020-12-11 DIAGNOSIS — M1711 Unilateral primary osteoarthritis, right knee: Principal | ICD-10-CM | POA: Insufficient documentation

## 2020-12-11 DIAGNOSIS — I10 Essential (primary) hypertension: Secondary | ICD-10-CM | POA: Insufficient documentation

## 2020-12-11 DIAGNOSIS — Z85828 Personal history of other malignant neoplasm of skin: Secondary | ICD-10-CM | POA: Diagnosis not present

## 2020-12-11 DIAGNOSIS — Z79899 Other long term (current) drug therapy: Secondary | ICD-10-CM | POA: Insufficient documentation

## 2020-12-11 DIAGNOSIS — Z87891 Personal history of nicotine dependence: Secondary | ICD-10-CM | POA: Diagnosis not present

## 2020-12-11 DIAGNOSIS — Z96651 Presence of right artificial knee joint: Secondary | ICD-10-CM

## 2020-12-11 HISTORY — PX: TOTAL KNEE ARTHROPLASTY: SHX125

## 2020-12-11 SURGERY — ARTHROPLASTY, KNEE, TOTAL
Anesthesia: Spinal | Site: Knee | Laterality: Right

## 2020-12-11 MED ORDER — ONDANSETRON HCL 4 MG PO TABS: 4.0000 mg | ORAL_TABLET | Freq: Four times a day (QID) | ORAL | Status: DC | PRN

## 2020-12-11 MED ORDER — ORAL CARE MOUTH RINSE: 15.0000 mL | Freq: Once | OROMUCOSAL | Status: AC

## 2020-12-11 MED ORDER — BUPIVACAINE LIPOSOME 1.3 % IJ SUSP
INTRAMUSCULAR | Status: AC
  Filled 2020-12-11: qty 20

## 2020-12-11 MED ORDER — TRANEXAMIC ACID 1000 MG/10ML IV SOLN
2000.0000 mg | INTRAVENOUS | Status: DC
  Filled 2020-12-11: qty 20

## 2020-12-11 MED ORDER — POVIDONE-IODINE 7.5 % EX SOLN
Freq: Once | CUTANEOUS | Status: DC
  Filled 2020-12-11: qty 118

## 2020-12-11 MED ORDER — CLONIDINE HCL (ANALGESIA) 100 MCG/ML EP SOLN
EPIDURAL | Status: DC | PRN
  Administered 2020-12-11: 1 mL

## 2020-12-11 MED ORDER — MORPHINE SULFATE (PF) 4 MG/ML IV SOLN
INTRAVENOUS | Status: DC | PRN
  Administered 2020-12-11: 8 mg via INTRAMUSCULAR

## 2020-12-11 MED ORDER — IRRISEPT - 450ML BOTTLE WITH 0.05% CHG IN STERILE WATER, USP 99.95% OPTIME
TOPICAL | Status: DC | PRN
  Administered 2020-12-11: 450 mL

## 2020-12-11 MED ORDER — 0.9 % SODIUM CHLORIDE (POUR BTL) OPTIME
TOPICAL | Status: DC | PRN
  Administered 2020-12-11 (×2): 2000 mL
  Administered 2020-12-11: 1000 mL

## 2020-12-11 MED ORDER — BUPIVACAINE IN DEXTROSE 0.75-8.25 % IT SOLN
INTRATHECAL | Status: DC | PRN
  Administered 2020-12-11: 2 mL via INTRATHECAL

## 2020-12-11 MED ORDER — EPHEDRINE SULFATE-NACL 50-0.9 MG/10ML-% IV SOSY
PREFILLED_SYRINGE | INTRAVENOUS | Status: DC | PRN
  Administered 2020-12-11: 5 mg via INTRAVENOUS
  Administered 2020-12-11: 10 mg via INTRAVENOUS
  Administered 2020-12-11: 5 mg via INTRAVENOUS

## 2020-12-11 MED ORDER — VANCOMYCIN HCL 1000 MG IV SOLR
INTRAVENOUS | Status: DC | PRN
  Administered 2020-12-11: 1000 mg

## 2020-12-11 MED ORDER — LACTATED RINGERS IV SOLN: INTRAVENOUS | Status: DC

## 2020-12-11 MED ORDER — PROPOFOL 500 MG/50ML IV EMUL
INTRAVENOUS | Status: DC | PRN
  Administered 2020-12-11: 50 ug/kg/min via INTRAVENOUS

## 2020-12-11 MED ORDER — MIDAZOLAM HCL 2 MG/2ML IJ SOLN
INTRAMUSCULAR | Status: AC
  Filled 2020-12-11: qty 2

## 2020-12-11 MED ORDER — VANCOMYCIN HCL 1000 MG IV SOLR
INTRAVENOUS | Status: AC
  Filled 2020-12-11: qty 1000

## 2020-12-11 MED ORDER — TRANEXAMIC ACID 1000 MG/10ML IV SOLN
INTRAVENOUS | Status: DC | PRN
  Administered 2020-12-11: 2000 mg via TOPICAL

## 2020-12-11 MED ORDER — PHENOL 1.4 % MT LIQD: 1.0000 | OROMUCOSAL | Status: DC | PRN

## 2020-12-11 MED ORDER — FENTANYL CITRATE (PF) 100 MCG/2ML IJ SOLN
25.0000 ug | INTRAMUSCULAR | Status: DC | PRN
  Administered 2020-12-11: 25 ug via INTRAVENOUS

## 2020-12-11 MED ORDER — ASPIRIN 81 MG PO CHEW
81.0000 mg | CHEWABLE_TABLET | Freq: Two times a day (BID) | ORAL | Status: DC
  Administered 2020-12-11 – 2020-12-13 (×4): 81 mg via ORAL
  Filled 2020-12-11 (×4): qty 1

## 2020-12-11 MED ORDER — CLONIDINE HCL (ANALGESIA) 100 MCG/ML EP SOLN
EPIDURAL | Status: AC
  Filled 2020-12-11: qty 10

## 2020-12-11 MED ORDER — GLYCOPYRROLATE 0.2 MG/ML IJ SOLN
INTRAMUSCULAR | Status: DC | PRN
  Administered 2020-12-11: .2 mg via INTRAVENOUS

## 2020-12-11 MED ORDER — METOCLOPRAMIDE HCL 5 MG/ML IJ SOLN: 5.0000 mg | Freq: Three times a day (TID) | INTRAMUSCULAR | Status: DC | PRN

## 2020-12-11 MED ORDER — MENTHOL 3 MG MT LOZG: 1.0000 | LOZENGE | OROMUCOSAL | Status: DC | PRN

## 2020-12-11 MED ORDER — SODIUM CHLORIDE 0.9 % IV SOLN
1.0000 g | Freq: Three times a day (TID) | INTRAVENOUS | Status: AC
  Administered 2020-12-11 – 2020-12-12 (×3): 1 g via INTRAVENOUS
  Filled 2020-12-11 (×2): qty 10

## 2020-12-11 MED ORDER — OXYCODONE HCL 5 MG/5ML PO SOLN: 5.0000 mg | Freq: Once | ORAL | Status: AC | PRN

## 2020-12-11 MED ORDER — FENTANYL CITRATE (PF) 100 MCG/2ML IJ SOLN
INTRAMUSCULAR | Status: AC
  Filled 2020-12-11: qty 2

## 2020-12-11 MED ORDER — BUPIVACAINE LIPOSOME 1.3 % IJ SUSP
INTRAMUSCULAR | Status: DC | PRN
  Administered 2020-12-11: 20 mL

## 2020-12-11 MED ORDER — LIDOCAINE 2% (20 MG/ML) 5 ML SYRINGE
INTRAMUSCULAR | Status: AC
  Filled 2020-12-11: qty 5

## 2020-12-11 MED ORDER — OXYCODONE HCL 5 MG PO TABS
ORAL_TABLET | ORAL | Status: AC
  Filled 2020-12-11: qty 1

## 2020-12-11 MED ORDER — PHENYLEPHRINE HCL-NACL 10-0.9 MG/250ML-% IV SOLN
INTRAVENOUS | Status: DC | PRN
  Administered 2020-12-11: 25 ug/min via INTRAVENOUS

## 2020-12-11 MED ORDER — HYDROMORPHONE HCL 1 MG/ML IJ SOLN
0.5000 mg | INTRAMUSCULAR | Status: DC | PRN
Start: 2020-12-11 — End: 2020-12-13
  Filled 2020-12-11: qty 0.5

## 2020-12-11 MED ORDER — CHLORHEXIDINE GLUCONATE 0.12 % MT SOLN
15.0000 mL | Freq: Once | OROMUCOSAL | Status: AC
  Administered 2020-12-11: 15 mL via OROMUCOSAL
  Filled 2020-12-11: qty 15

## 2020-12-11 MED ORDER — OXYCODONE HCL 5 MG PO TABS
5.0000 mg | ORAL_TABLET | Freq: Once | ORAL | Status: AC | PRN
  Administered 2020-12-11: 5 mg via ORAL

## 2020-12-11 MED ORDER — METHOCARBAMOL 1000 MG/10ML IJ SOLN
500.0000 mg | Freq: Four times a day (QID) | INTRAMUSCULAR | Status: DC | PRN
  Filled 2020-12-11: qty 5

## 2020-12-11 MED ORDER — CEFAZOLIN SODIUM-DEXTROSE 2-4 GM/100ML-% IV SOLN
2.0000 g | INTRAVENOUS | Status: AC
  Administered 2020-12-11: 2 g via INTRAVENOUS
  Filled 2020-12-11: qty 100

## 2020-12-11 MED ORDER — ROCURONIUM BROMIDE 10 MG/ML (PF) SYRINGE
PREFILLED_SYRINGE | INTRAVENOUS | Status: AC
  Filled 2020-12-11: qty 10

## 2020-12-11 MED ORDER — ONDANSETRON HCL 4 MG/2ML IJ SOLN
INTRAMUSCULAR | Status: AC
  Filled 2020-12-11: qty 2

## 2020-12-11 MED ORDER — TRANEXAMIC ACID-NACL 1000-0.7 MG/100ML-% IV SOLN
1000.0000 mg | INTRAVENOUS | Status: AC
  Administered 2020-12-11: 1000 mg via INTRAVENOUS
  Filled 2020-12-11: qty 100

## 2020-12-11 MED ORDER — DEXAMETHASONE SODIUM PHOSPHATE 10 MG/ML IJ SOLN
INTRAMUSCULAR | Status: AC
  Filled 2020-12-11: qty 1

## 2020-12-11 MED ORDER — TRANEXAMIC ACID-NACL 1000-0.7 MG/100ML-% IV SOLN
INTRAVENOUS | Status: AC
  Filled 2020-12-11: qty 100

## 2020-12-11 MED ORDER — ONDANSETRON HCL 4 MG/2ML IJ SOLN: 4.0000 mg | Freq: Once | INTRAMUSCULAR | Status: DC | PRN

## 2020-12-11 MED ORDER — SODIUM CHLORIDE 0.9 % IV SOLN
12.5000 mg | Freq: Four times a day (QID) | INTRAVENOUS | Status: DC | PRN
  Administered 2020-12-11 – 2020-12-12 (×2): 12.5 mg via INTRAVENOUS
  Filled 2020-12-11 (×3): qty 0.5

## 2020-12-11 MED ORDER — SODIUM CHLORIDE 0.9 % IR SOLN
Status: DC | PRN
  Administered 2020-12-11: 3000 mL

## 2020-12-11 MED ORDER — FENTANYL CITRATE (PF) 250 MCG/5ML IJ SOLN
INTRAMUSCULAR | Status: AC
  Filled 2020-12-11: qty 5

## 2020-12-11 MED ORDER — SUCCINYLCHOLINE CHLORIDE 200 MG/10ML IV SOSY
PREFILLED_SYRINGE | INTRAVENOUS | Status: AC
  Filled 2020-12-11: qty 10

## 2020-12-11 MED ORDER — LISINOPRIL 20 MG PO TABS
20.0000 mg | ORAL_TABLET | Freq: Every day | ORAL | Status: DC
  Administered 2020-12-12 – 2020-12-13 (×2): 20 mg via ORAL
  Filled 2020-12-11 (×2): qty 1

## 2020-12-11 MED ORDER — BUPIVACAINE HCL (PF) 0.25 % IJ SOLN
INTRAMUSCULAR | Status: AC
  Filled 2020-12-11: qty 30

## 2020-12-11 MED ORDER — METOCLOPRAMIDE HCL 5 MG PO TABS: 5.0000 mg | ORAL_TABLET | Freq: Three times a day (TID) | ORAL | Status: DC | PRN

## 2020-12-11 MED ORDER — AMISULPRIDE (ANTIEMETIC) 5 MG/2ML IV SOLN: 10.0000 mg | Freq: Once | INTRAVENOUS | Status: DC | PRN

## 2020-12-11 MED ORDER — CELECOXIB 200 MG PO CAPS
200.0000 mg | ORAL_CAPSULE | Freq: Two times a day (BID) | ORAL | Status: DC
  Administered 2020-12-11 – 2020-12-13 (×4): 200 mg via ORAL
  Filled 2020-12-11 (×4): qty 1

## 2020-12-11 MED ORDER — DOCUSATE SODIUM 100 MG PO CAPS
100.0000 mg | ORAL_CAPSULE | Freq: Two times a day (BID) | ORAL | Status: DC
  Administered 2020-12-11 – 2020-12-13 (×4): 100 mg via ORAL
  Filled 2020-12-11 (×4): qty 1

## 2020-12-11 MED ORDER — ONDANSETRON HCL 4 MG/2ML IJ SOLN
INTRAMUSCULAR | Status: DC | PRN
  Administered 2020-12-11: 4 mg via INTRAVENOUS

## 2020-12-11 MED ORDER — EPHEDRINE 5 MG/ML INJ
INTRAVENOUS | Status: AC
  Filled 2020-12-11: qty 10

## 2020-12-11 MED ORDER — HYDROCODONE-ACETAMINOPHEN 7.5-325 MG PO TABS: 1.0000 | ORAL_TABLET | ORAL | Status: DC | PRN

## 2020-12-11 MED ORDER — MORPHINE SULFATE (PF) 4 MG/ML IV SOLN
INTRAVENOUS | Status: AC
  Filled 2020-12-11: qty 2

## 2020-12-11 MED ORDER — POVIDONE-IODINE 10 % EX SWAB: 2.0000 "application " | Freq: Once | CUTANEOUS | Status: DC

## 2020-12-11 MED ORDER — ONDANSETRON HCL 4 MG/2ML IJ SOLN
4.0000 mg | Freq: Once | INTRAMUSCULAR | Status: AC
  Administered 2020-12-11: 4 mg via INTRAVENOUS

## 2020-12-11 MED ORDER — ONDANSETRON HCL 4 MG/2ML IJ SOLN
4.0000 mg | Freq: Four times a day (QID) | INTRAMUSCULAR | Status: DC | PRN
  Administered 2020-12-11 – 2020-12-12 (×2): 4 mg via INTRAVENOUS
  Filled 2020-12-11 (×2): qty 2

## 2020-12-11 MED ORDER — PROPOFOL 10 MG/ML IV BOLUS
INTRAVENOUS | Status: AC
  Filled 2020-12-11: qty 40

## 2020-12-11 MED ORDER — FENTANYL CITRATE (PF) 100 MCG/2ML IJ SOLN
INTRAMUSCULAR | Status: DC | PRN
  Administered 2020-12-11 (×2): 50 ug via INTRAVENOUS

## 2020-12-11 MED ORDER — OXYCODONE HCL 5 MG PO TABS: 5.0000 mg | ORAL_TABLET | ORAL | Status: DC | PRN

## 2020-12-11 MED ORDER — TRAMADOL HCL 50 MG PO TABS
50.0000 mg | ORAL_TABLET | Freq: Four times a day (QID) | ORAL | Status: DC | PRN
Start: 2020-12-11 — End: 2020-12-13
  Administered 2020-12-11: 50 mg via ORAL
  Administered 2020-12-12 (×2): 100 mg via ORAL
  Administered 2020-12-12 – 2020-12-13 (×2): 50 mg via ORAL
  Administered 2020-12-13: 100 mg via ORAL
  Filled 2020-12-11 (×2): qty 2
  Filled 2020-12-11 (×3): qty 1
  Filled 2020-12-11: qty 2

## 2020-12-11 MED ORDER — SODIUM CHLORIDE 0.9% FLUSH
INTRAVENOUS | Status: DC | PRN
  Administered 2020-12-11: 20 mL

## 2020-12-11 MED ORDER — METHOCARBAMOL 500 MG PO TABS
ORAL_TABLET | ORAL | Status: AC
  Filled 2020-12-11: qty 1

## 2020-12-11 MED ORDER — PHENYLEPHRINE 40 MCG/ML (10ML) SYRINGE FOR IV PUSH (FOR BLOOD PRESSURE SUPPORT)
PREFILLED_SYRINGE | INTRAVENOUS | Status: AC
  Filled 2020-12-11: qty 10

## 2020-12-11 MED ORDER — FAMOTIDINE 20 MG PO TABS: 20.0000 mg | ORAL_TABLET | Freq: Two times a day (BID) | ORAL | Status: DC | PRN

## 2020-12-11 MED ORDER — MIDAZOLAM HCL 2 MG/2ML IJ SOLN
INTRAMUSCULAR | Status: DC | PRN
  Administered 2020-12-11: 1 mg via INTRAVENOUS
  Administered 2020-12-11: .5 mg via INTRAVENOUS

## 2020-12-11 MED ORDER — ACETAMINOPHEN 325 MG PO TABS: 325.0000 mg | ORAL_TABLET | Freq: Four times a day (QID) | ORAL | Status: DC | PRN

## 2020-12-11 MED ORDER — METHOCARBAMOL 500 MG PO TABS
500.0000 mg | ORAL_TABLET | Freq: Four times a day (QID) | ORAL | Status: DC | PRN
  Administered 2020-12-11 – 2020-12-13 (×2): 500 mg via ORAL
  Filled 2020-12-11: qty 1

## 2020-12-11 MED ORDER — BUPIVACAINE HCL (PF) 0.25 % IJ SOLN
INTRAMUSCULAR | Status: DC | PRN
  Administered 2020-12-11: 20 mL
  Administered 2020-12-11: 10 mL

## 2020-12-11 MED ORDER — ROPIVACAINE HCL 5 MG/ML IJ SOLN
INTRAMUSCULAR | Status: DC | PRN
  Administered 2020-12-11: 30 mL via PERINEURAL

## 2020-12-11 SURGICAL SUPPLY — 81 items
BAG DECANTER FOR FLEXI CONT (MISCELLANEOUS) ×2 IMPLANT
BANDAGE ESMARK 6X9 LF (GAUZE/BANDAGES/DRESSINGS) ×1 IMPLANT
BASEPLATE TIBIAL TRIATHALON 3 (Plate) ×1 IMPLANT
BLADE SAG 18X100X1.27 (BLADE) ×2 IMPLANT
BNDG CMPR 9X6 STRL LF SNTH (GAUZE/BANDAGES/DRESSINGS) ×1
BNDG CMPR MED 15X6 ELC VLCR LF (GAUZE/BANDAGES/DRESSINGS) ×1
BNDG COHESIVE 6X5 TAN NS LF (GAUZE/BANDAGES/DRESSINGS) ×1 IMPLANT
BNDG COHESIVE 6X5 TAN STRL LF (GAUZE/BANDAGES/DRESSINGS) ×2 IMPLANT
BNDG ELASTIC 6X15 VLCR STRL LF (GAUZE/BANDAGES/DRESSINGS) ×2 IMPLANT
BNDG ELASTIC 6X5.8 VLCR STR LF (GAUZE/BANDAGES/DRESSINGS) ×2 IMPLANT
BNDG ESMARK 6X9 LF (GAUZE/BANDAGES/DRESSINGS) ×2
BOWL SMART MIX CTS (DISPOSABLE) IMPLANT
BSPLAT TIB 3 CMNT PRM STRL KN (Plate) ×1 IMPLANT
CEMENT BONE SIMPLEX SPEEDSET (Cement) ×1 IMPLANT
CLSR STERI-STRIP ANTIMIC 1/2X4 (GAUZE/BANDAGES/DRESSINGS) ×1 IMPLANT
CNTNR URN SCR LID CUP LEK RST (MISCELLANEOUS) ×1 IMPLANT
COMPONENT TRI CR RETAIN KNEE (Orthopedic Implant) IMPLANT
CONT SPEC 4OZ STRL OR WHT (MISCELLANEOUS) ×2
COVER SURGICAL LIGHT HANDLE (MISCELLANEOUS) ×2 IMPLANT
COVER WAND RF STERILE (DRAPES) ×1 IMPLANT
CUFF TOURN SGL QUICK 34 (TOURNIQUET CUFF) ×2
CUFF TOURN SGL QUICK 42 (TOURNIQUET CUFF) IMPLANT
CUFF TRNQT CYL 34X4.125X (TOURNIQUET CUFF) ×1 IMPLANT
DECANTER SPIKE VIAL GLASS SM (MISCELLANEOUS) ×2 IMPLANT
DRAPE INCISE IOBAN 66X45 STRL (DRAPES) IMPLANT
DRAPE ORTHO SPLIT 77X108 STRL (DRAPES) ×6
DRAPE SURG ORHT 6 SPLT 77X108 (DRAPES) ×3 IMPLANT
DRAPE U-SHAPE 47X51 STRL (DRAPES) ×2 IMPLANT
DRSG AQUACEL AG ADV 3.5X14 (GAUZE/BANDAGES/DRESSINGS) ×1 IMPLANT
DURAPREP 26ML APPLICATOR (WOUND CARE) ×4 IMPLANT
ELECT CAUTERY BLADE 6.4 (BLADE) ×2 IMPLANT
ELECT REM PT RETURN 9FT ADLT (ELECTROSURGICAL) ×2
ELECTRODE REM PT RTRN 9FT ADLT (ELECTROSURGICAL) ×1 IMPLANT
GAUZE SPONGE 4X4 12PLY STRL (GAUZE/BANDAGES/DRESSINGS) ×2 IMPLANT
GLOVE ECLIPSE 7.0 STRL STRAW (GLOVE) ×2 IMPLANT
GLOVE ECLIPSE 8.0 STRL XLNG CF (GLOVE) ×2 IMPLANT
GLOVE SRG 8 PF TXTR STRL LF DI (GLOVE) ×1 IMPLANT
GLOVE SURG UNDER POLY LF SZ7 (GLOVE) ×2 IMPLANT
GLOVE SURG UNDER POLY LF SZ8 (GLOVE) ×2
GOWN STRL REUS W/ TWL LRG LVL3 (GOWN DISPOSABLE) ×3 IMPLANT
GOWN STRL REUS W/TWL LRG LVL3 (GOWN DISPOSABLE) ×6
HANDPIECE INTERPULSE COAX TIP (DISPOSABLE) ×2
HOOD PEEL AWAY FLYTE STAYCOOL (MISCELLANEOUS) ×6 IMPLANT
IMMOBILIZER KNEE 20 (SOFTGOODS)
IMMOBILIZER KNEE 20 THIGH 36 (SOFTGOODS) IMPLANT
IMMOBILIZER KNEE 22 UNIV (SOFTGOODS) ×1 IMPLANT
IMMOBILIZER KNEE 24 THIGH 36 (MISCELLANEOUS) IMPLANT
IMMOBILIZER KNEE 24 UNIV (MISCELLANEOUS)
INSERT TIB BEARING SZ3 11 (Miscellaneous) ×1 IMPLANT
KIT BASIN OR (CUSTOM PROCEDURE TRAY) ×2 IMPLANT
KIT TURNOVER KIT B (KITS) ×2 IMPLANT
MANIFOLD NEPTUNE II (INSTRUMENTS) ×2 IMPLANT
NDL SPNL 18GX3.5 QUINCKE PK (NEEDLE) ×1 IMPLANT
NEEDLE 22X1 1/2 (OR ONLY) (NEEDLE) ×4 IMPLANT
NEEDLE SPNL 18GX3.5 QUINCKE PK (NEEDLE) ×2 IMPLANT
NS IRRIG 1000ML POUR BTL (IV SOLUTION) ×4 IMPLANT
PACK TOTAL JOINT (CUSTOM PROCEDURE TRAY) ×2 IMPLANT
PAD ARMBOARD 7.5X6 YLW CONV (MISCELLANEOUS) ×4 IMPLANT
PAD CAST 4YDX4 CTTN HI CHSV (CAST SUPPLIES) ×1 IMPLANT
PADDING CAST COTTON 4X4 STRL (CAST SUPPLIES) ×2
PADDING CAST COTTON 6X4 STRL (CAST SUPPLIES) ×3 IMPLANT
PATELLA 32MMX10MM (Knees) ×1 IMPLANT
PIN FLUTED HEDLESS FIX 3.5X1/8 (PIN) ×1 IMPLANT
SET HNDPC FAN SPRY TIP SCT (DISPOSABLE) ×1 IMPLANT
STRIP CLOSURE SKIN 1/2X4 (GAUZE/BANDAGES/DRESSINGS) ×4 IMPLANT
SUCTION FRAZIER HANDLE 10FR (MISCELLANEOUS) ×2
SUCTION TUBE FRAZIER 10FR DISP (MISCELLANEOUS) ×1 IMPLANT
SUT MNCRL AB 3-0 PS2 18 (SUTURE) ×2 IMPLANT
SUT VIC AB 0 CT1 27 (SUTURE) ×6
SUT VIC AB 0 CT1 27XBRD ANBCTR (SUTURE) ×3 IMPLANT
SUT VIC AB 1 CT1 27 (SUTURE) ×10
SUT VIC AB 1 CT1 27XBRD ANBCTR (SUTURE) ×5 IMPLANT
SUT VIC AB 2-0 CT1 27 (SUTURE) ×8
SUT VIC AB 2-0 CT1 TAPERPNT 27 (SUTURE) ×4 IMPLANT
SYR 30ML LL (SYRINGE) ×6 IMPLANT
SYR TB 1ML LUER SLIP (SYRINGE) ×2 IMPLANT
TOWEL GREEN STERILE (TOWEL DISPOSABLE) ×4 IMPLANT
TOWEL GREEN STERILE FF (TOWEL DISPOSABLE) ×4 IMPLANT
TRAY CATH 16FR W/PLASTIC CATH (SET/KITS/TRAYS/PACK) IMPLANT
TRIA CRUCIATE RETAIN KNEE (Orthopedic Implant) ×2 IMPLANT
WATER STERILE IRR 1000ML POUR (IV SOLUTION) IMPLANT

## 2020-12-11 NOTE — Brief Op Note (Signed)
   12/11/2020  10:16 AM  PATIENT:  Carol Tucker  80 y.o. female  PRE-OPERATIVE DIAGNOSIS:  right knee osteoarthritis  POST-OPERATIVE DIAGNOSIS:  right knee osteoarthritis  PROCEDURE:  Procedure(s): RIGHT TOTAL KNEE ARTHROPLASTY  SURGEON:  Surgeon(s): Meredith Pel, MD  ASSISTANT: magnant pa  ANESTHESIA:   spinal  EBL: 75 ml    Total I/O In: 1000 [I.V.:800; IV Piggyback:200] Out: 50 [Blood:50]  BLOOD ADMINISTERED: none  DRAINS: none   LOCAL MEDICATIONS USED: Marcaine morphine clonidine Exparel vancomycin powder  SPECIMEN:  No Specimen  COUNTS:  YES  TOURNIQUET: 91 minutes at 300 mmHg  DICTATION: .[id number]  PLAN OF CARE: Admit for overnight observation  PATIENT DISPOSITION:  PACU - hemodynamically stable

## 2020-12-11 NOTE — Anesthesia Procedure Notes (Signed)
Spinal  Patient location during procedure: OR Reason for block: surgical anesthesia Staffing Performed: anesthesiologist  Anesthesiologist: Lunabella Badgett E, MD Preanesthetic Checklist Completed: patient identified, IV checked, risks and benefits discussed, surgical consent, monitors and equipment checked, pre-op evaluation and timeout performed Spinal Block Patient position: sitting Prep: DuraPrep and site prepped and draped Patient monitoring: continuous pulse ox, blood pressure and heart rate Approach: midline Location: L3-4 Injection technique: single-shot Needle Needle type: Pencan  Needle gauge: 24 G Needle length: 9 cm Assessment Events: CSF return Additional Notes Functioning IV was confirmed and monitors were applied. Sterile prep and drape, including hand hygiene and sterile gloves were used. The patient was positioned and the spine was prepped. The skin was anesthetized with lidocaine.  Free flow of clear CSF was obtained prior to injecting local anesthetic into the CSF. The needle was carefully withdrawn. The patient tolerated the procedure well.     

## 2020-12-11 NOTE — Anesthesia Preprocedure Evaluation (Addendum)
Anesthesia Evaluation  Patient identified by MRN, date of birth, ID band Patient awake    Reviewed: Allergy & Precautions, NPO status , Patient's Chart, lab work & pertinent test results  History of Anesthesia Complications Negative for: history of anesthetic complications  Airway Mallampati: II  TM Distance: >3 FB Neck ROM: Full    Dental   Pulmonary neg pulmonary ROS, former smoker,    Pulmonary exam normal        Cardiovascular hypertension, Pt. on medications Normal cardiovascular exam     Neuro/Psych negative neurological ROS     GI/Hepatic Neg liver ROS, GERD  ,  Endo/Other  diabetes (pre-DM)  Renal/GU negative Renal ROS  negative genitourinary   Musculoskeletal  (+) Arthritis ,   Abdominal   Peds  Hematology negative hematology ROS (+)   Anesthesia Other Findings  plts 255  Reproductive/Obstetrics                            Anesthesia Physical Anesthesia Plan  ASA: II  Anesthesia Plan: Spinal   Post-op Pain Management:  Regional for Post-op pain   Induction:   PONV Risk Score and Plan: 2 and Propofol infusion, Treatment may vary due to age or medical condition, Ondansetron and TIVA  Airway Management Planned: Nasal Cannula and Simple Face Mask  Additional Equipment: None  Intra-op Plan:   Post-operative Plan:   Informed Consent: I have reviewed the patients History and Physical, chart, labs and discussed the procedure including the risks, benefits and alternatives for the proposed anesthesia with the patient or authorized representative who has indicated his/her understanding and acceptance.       Plan Discussed with:   Anesthesia Plan Comments:         Anesthesia Quick Evaluation

## 2020-12-11 NOTE — Transfer of Care (Signed)
Immediate Anesthesia Transfer of Care Note  Patient: Carol Tucker  Procedure(s) Performed: RIGHT TOTAL KNEE ARTHROPLASTY (Right Knee)  Patient Location: PACU  Anesthesia Type:Spinal and MAC combined with regional for post-op pain  Level of Consciousness: awake and patient cooperative  Airway & Oxygen Therapy: Patient Spontanous Breathing  Post-op Assessment: Report given to RN and Post -op Vital signs reviewed and stable  Post vital signs: Reviewed and stable  Last Vitals:  Vitals Value Taken Time  BP 84/42 12/11/20 1017  Temp 36.4 C 12/11/20 1015  Pulse 69 12/11/20 1018  Resp 19 12/11/20 1018  SpO2 97 % 12/11/20 1018  Vitals shown include unvalidated device data.  Last Pain:  Vitals:   12/11/20 0619  TempSrc:   PainSc: 0-No pain      Patients Stated Pain Goal: 2 (12/50/87 1994)  Complications: No complications documented.

## 2020-12-11 NOTE — Plan of Care (Signed)
  Problem: Education: Goal: Knowledge of General Education information will improve Description: Including pain rating scale, medication(s)/side effects and non-pharmacologic comfort measures Outcome: Progressing   Problem: Activity: Goal: Risk for activity intolerance will decrease Outcome: Progressing   Problem: Nutrition: Goal: Adequate nutrition will be maintained Outcome: Progressing   

## 2020-12-11 NOTE — Plan of Care (Signed)

## 2020-12-11 NOTE — H&P (Signed)
TOTAL KNEE ADMISSION H&P  Patient is being admitted for right total knee arthroplasty.  Subjective:  Chief Complaint:right knee pain.  HPI: Carol Tucker, 80 y.o. female, has a history of pain and functional disability in the right knee due to arthritis and has failed non-surgical conservative treatments for greater than 12 weeks to includeNSAID's and/or analgesics, corticosteriod injections, flexibility and strengthening excercises and activity modification.  Onset of symptoms was gradual, starting 10 years ago with gradually worsening course since that time. The patient noted prior procedures on the knee to include  arthroscopy on the right knee(s).  Patient currently rates pain in the right knee(s) at 8 out of 10 with activity. Patient has night pain, worsening of pain with activity and weight bearing, pain that interferes with activities of daily living, pain with passive range of motion, crepitus and joint swelling.  Patient has evidence of subchondral sclerosis and joint space narrowing by imaging studies. This patient has had Night pain on most nights.  Patient also has pain which interferes with activities of daily living and the quality of life which she wishes to have.. There is no active infection.  Patient Active Problem List   Diagnosis Date Noted  . Vitamin D deficiency 03/10/2017  . Chronic pain of right knee 10/08/2016  . Chronic pain of left knee 10/08/2016  . Atypical chest pain 07/07/2016  . Prediabetes 12/20/2015  . Microscopic hematuria 01/26/2015  . Polypharmacy 01/26/2015  . Carpal tunnel syndrome of right wrist 01/26/2015  . Panic attack 01/26/2015  . Migraine 04/05/2012  . Hyperlipidemia   . GERD (gastroesophageal reflux disease)   . Osteopenia   . HTN (hypertension) 11/04/2011   Past Medical History:  Diagnosis Date  . Allergy   . Anxiety   . Arthritis    Bilateral knees  . Cancer ([place])    Basal cell carcinoma 2021  . Depression    Years ago back in  2000 when husband left and father died   . GERD (gastroesophageal reflux disease)   . Hyperlipidemia   . Hypertension   . Osteopenia 8-04   DEXA at [facility] 04/14/14 with lowest Tscore -1.7 at Left femoral neck. FRAX does not indicate need for bisphosphonate but density has sig worsened since prior dexa on 12/17/2009  . Pre-diabetes   . Prediabetes     Past Surgical History:  Procedure Laterality Date  . APPENDECTOMY    . DILATION AND CURETTAGE OF UTERUS  1969   MISSED AB  . KNEE ARTHROSCOPY  2007  . 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 TUBAL LIGATION  1980  . tumor removal salivary gland      Current Facility-Administered Medications  Medication Dose Route Frequency Provider Last Rate Last Admin  . ceFAZolin (ANCEF) IVPB 2g/100 mL premix  2 g Intravenous On Call to OR Magnant, Gerrianne Scale, PA-C      . lactated ringers infusion   Intravenous Continuous Suzette Battiest, MD      . ondansetron [facility]) 4 MG/2ML injection           . povidone-iodine (BETADINE) 7.5 % scrub   Topical Once Magnant, Charles L, PA-C      . povidone-iodine 10 % swab 2 application  2 application Topical Once Magnant, Charles L, PA-C      . povidone-iodine 10 % swab 2 application  2 application Topical Once Magnant, Charles L, PA-C      . tranexamic acid (CYKLOKAPRON) 2,000 mg in sodium chloride 0.9 %  50 mL Topical Application  6,440 mg Topical To OR Magnant, Charles L, PA-C      . tranexamic acid (CYKLOKAPRON) IVPB 1,000 mg  1,000 mg Intravenous To OR Magnant, Charles L, PA-C       Allergies  Allergen Reactions  . Metoclopramide Hcl     REACTION: tremors, clinched jaws     REGLAN    Social History   Tobacco Use  . Smoking status: Former Smoker    Quit date: 1975    Years since quitting: 47.3  . Smokeless tobacco: Never Used  Substance Use Topics  . Alcohol use: Yes    Comment: Rare- 2 glasses of wine a year    Family History  Problem Relation Age of Onset  . Hypertension Father   . Heart disease  Father   . Cancer Father        BLADDER & LUNG  . Heart disease Paternal Grandmother   . Alzheimer's disease Mother   . Heart disease Brother   . Heart disease Paternal Grandfather   . Diabetes Paternal Grandfather   . Colon cancer Neg Hx   . Pancreatic cancer Neg Hx   . Stomach cancer Neg Hx      Review of Systems  Musculoskeletal: Positive for arthralgias.  All other systems reviewed and are negative.   Objective:  Physical Exam Vitals reviewed.  HENT:     Head: Normocephalic.     Nose: Nose normal.     Mouth/Throat:     Mouth: Mucous membranes are moist.  Eyes:     Pupils: Pupils are equal, round, and reactive to light.  Cardiovascular:     Rate and Rhythm: Normal rate.     Pulses: Normal pulses.  Pulmonary:     Effort: Pulmonary effort is normal.  Abdominal:     General: Abdomen is flat.  Musculoskeletal:     Cervical back: Normal range of motion.  Skin:    General: Skin is warm.     Capillary Refill: Capillary refill takes less than 2 seconds.  Neurological:     General: No focal deficit present.     Mental Status: She is alert.  Psychiatric:        Mood and Affect: Mood normal.   Examination of the right knee demonstrates range of motion 5-1 15.  Ankle dorsiflexion intact.  Skin is intact.  Extensor mechanism is functional.  Collateral ligaments are stable.  Pedal pulses 2+ out of 4 Vital signs in last 24 hours: Temp:  [97.6 F (36.4 C)] 97.6 F (36.4 C) (04/26 0601) Pulse Rate:  [72] 72 (04/26 0601) Resp:  [18] 18 (04/26 0601) BP: (145)/(52) 145/52 (04/26 0601) SpO2:  [98 %] 98 % (04/26 0601) Weight:  [64.4 kg] 64.4 kg (04/26 0601)  Labs:   Estimated body mass index is 24.76 kg/m as calculated from the following:   Height as of this encounter: 5' 3.5" (1.613 m).   Weight as of this encounter: 64.4 kg.   Imaging Review Plain radiographs demonstrate moderate degenerative joint disease of the right knee(s). The overall alignment ismild varus.  The bone quality appears to be good for age and reported activity level.      Assessment/Plan:  End stage arthritis, right knee   The patient history, physical examination, clinical judgment of the provider and imaging studies are consistent with end stage degenerative joint disease of the right knee(s) and total knee arthroplasty is deemed medically necessary. The treatment options including medical management, injection  therapy arthroscopy and arthroplasty were discussed at length. The risks and benefits of total knee arthroplasty were presented and reviewed. The risks due to aseptic loosening, infection, stiffness, patella tracking problems, thromboembolic complications and other imponderables were discussed. The patient acknowledged the explanation, agreed to proceed with the plan and consent was signed. Patient is being admitted for inpatient treatment for surgery, pain control, PT, OT, prophylactic antibiotics, VTE prophylaxis, progressive ambulation and ADL's and discharge planning. The patient is planning to be discharged home with home health services     Patient's anticipated LOS is less than 2 midnights, meeting these requirements: - Younger than 6 - Lives within 1 hour of care - Has a competent adult at home to recover with post-op recover - NO history of  - Chronic pain requiring opiods  - Diabetes  - Coronary Artery Disease  - Heart failure  - Heart attack  - Stroke  - DVT/VTE  - Cardiac arrhythmia  - Respiratory Failure/COPD  - Renal failure  - Anemia  - Advanced Liver disease

## 2020-12-11 NOTE — Anesthesia Procedure Notes (Signed)
Procedure Name: MAC Date/Time: 12/11/2020 7:32 AM Performed by: Lowella Dell, CRNA Pre-anesthesia Checklist: Patient identified, Emergency Drugs available, Suction available, Patient being monitored and Timeout performed Patient Re-evaluated:Patient Re-evaluated prior to induction Oxygen Delivery Method: Nasal cannula Placement Confirmation: positive ETCO2 Dental Injury: Teeth and Oropharynx as per pre-operative assessment

## 2020-12-11 NOTE — Anesthesia Procedure Notes (Signed)
Anesthesia Regional Block: Adductor canal block   Pre-Anesthetic Checklist: ,, timeout performed, Correct Patient, Correct Site, Correct Laterality, Correct Procedure, Correct Position, site marked, Risks and benefits discussed,  Surgical consent,  Pre-op evaluation,  At surgeon's request and post-op pain management  Laterality: Right  Prep: chloraprep       Needles:  Injection technique: Single-shot  Needle Type: Echogenic Stimulator Needle     Needle Length: 10cm  Needle Gauge: 20     Additional Needles:   Procedures:,,,, ultrasound used (permanent image in chart),,,,  Narrative:  Start time: 12/11/2020 6:48 AM End time: 12/11/2020 6:53 AM Injection made incrementally with aspirations every 5 mL.  Performed by: Personally  Anesthesiologist: Lidia Collum, MD  Additional Notes: Standard monitors applied. Skin prepped. Good needle visualization with ultrasound. Injection made in 5cc increments with no resistance to injection. Patient tolerated the procedure well.

## 2020-12-11 NOTE — Progress Notes (Signed)
PT Cancellation Note  Patient Details Name: Carol Tucker MRN: [id number] DOB: [birth year]   Cancelled Treatment:    Reason Eval/Treat Not Completed: Medical issues which prohibited therapy Per RN, pt reporting nausea earlier and gave meds at 1500. Checked on at 1550 and pt reporting increased nausea and requesting to hold on PT for today. Will follow up as schedule allows.   Lou Miner, DPT  Acute Rehabilitation Services  Pager: [phone] Office: [phone]    Rudean Hitt 12/11/2020, 3:51 PM

## 2020-12-11 NOTE — Anesthesia Postprocedure Evaluation (Signed)
Anesthesia Post Note  Patient: Carol Tucker  Procedure(s) Performed: RIGHT TOTAL KNEE ARTHROPLASTY (Right Knee)     Patient location during evaluation: PACU Anesthesia Type: Spinal Level of consciousness: oriented and awake and alert Pain management: pain level controlled Vital Signs Assessment: post-procedure vital signs reviewed and stable Respiratory status: spontaneous breathing, respiratory function stable and nonlabored ventilation Cardiovascular status: blood pressure returned to baseline and stable Postop Assessment: no headache, no backache, no apparent nausea or vomiting and spinal receding Anesthetic complications: no   No complications documented.  Last Vitals:  Vitals:   12/11/20 1200 12/11/20 1215  BP: (!) 100/49 (!) 97/52  Pulse: (!) 59 (!) 57  Resp: 16 13  Temp:    SpO2: 98% 100%    Last Pain:  Vitals:   12/11/20 1215  TempSrc:   PainSc: 1                  Lidia Collum

## 2020-12-11 NOTE — Progress Notes (Signed)
Orthopedic Tech Progress Note Patient Details:  Carol Tucker October 01, 1940 [id number]  CPM Right Knee CPM Right Knee: On Right Knee Flexion (Degrees): 10 Right Knee Extension (Degrees): 40  Post Interventions Patient Tolerated: Well Instructions Provided: Care of device Ortho Devices Type of Ortho Device: Bone foam zero knee Ortho Device/Splint Interventions: Application,Ordered,Adjustment   Post Interventions Patient Tolerated: Well Instructions Provided: Care of device   Janit Pagan 12/11/2020, 12:15 PM

## 2020-12-12 ENCOUNTER — Encounter (HOSPITAL_COMMUNITY): Payer: Self-pay | Admitting: Orthopedic Surgery

## 2020-12-12 DIAGNOSIS — M1711 Unilateral primary osteoarthritis, right knee: Secondary | ICD-10-CM | POA: Diagnosis not present

## 2020-12-12 MED ORDER — PROMETHAZINE HCL 25 MG PO TABS: 25.0000 mg | ORAL_TABLET | Freq: Four times a day (QID) | ORAL | Status: DC | PRN

## 2020-12-12 NOTE — Progress Notes (Signed)
PT Cancellation Note  Patient Details Name: Carol Tucker MRN: [id number] DOB: 19-Jan-1941   Cancelled Treatment:    Reason Eval/Treat Not Completed: Medical issues which prohibited therapy.  Declining PT over nausea, but did walk with nursing to the bathroom earlier.  Follow up with pt.   Ramond Dial 12/12/2020, 10:14 AM   Mee Hives, PT MS Acute Rehab Dept. Number: [place] and [place]

## 2020-12-12 NOTE — Op Note (Signed)
NAME: Carol Tucker, PONTI MEDICAL RECORD NO: [id number] ACCOUNT NO: [id number] DATE OF BIRTH: [birth year] FACILITY: [facility] LOCATION: [facility] PHYSICIAN: Yetta Barre. Marlou Sa, MD  Operative Report   PREOPERATIVE DIAGNOSIS:  Right knee arthritis.  POSTOPERATIVE DIAGNOSIS:  Right knee arthritis.  PROCEDURE:  Right total knee replacement utilizing press-fit posterior cruciate retaining femur size 3 with cemented tibia size 3, 11 mm deep dish polyethylene insert and cemented 32 mm 3-peg patella.  SURGEON ATTENDING:  Yetta Barre. Marlou Sa, MD  ASSISTANT:  Annie Main, PA  INDICATIONS:  A 80 year old patient with end-stage right knee arthritis, who presents for operative management after explanation of risks and benefits.  DESCRIPTION OF PROCEDURE IN DETAIL:  The patient was brought to the operating room where spinal anesthetic was induced.  Preoperative antibiotics were administered.  Timeout was called.  Right leg was examined under anesthesia and found to have about a  5-10 degree flexion contracture, but flexion to about 115.  Right leg was pre-scrubbed with alcohol and Betadine, allowed the air to dry. Prepped with DuraPrep solution and draped in a sterile manner.  Charlie Pitter was used to cover the operative field.  The  leg was elevated and exsanguinated with the Esmarch wrap. Tourniquet inflated to 300 mmHg for a total tourniquet time of 91 minutes.  Anterior approach to knee was made.  Skin and subcutaneous tissue were sharply divided.  Median parapatellar approach  was made, marked with #1 Vicryl suture.  Patella was everted.  A lateral patellofemoral ligament was released.  Synovitis was removed from the anterior distal femur.  Minimal soft tissue dissection was performed on the medial side.  Fat pad was partially  excised.  ACL was released.  The anterior horn of the lateral meniscus was released.  Osteophytes were removed.  At this time, intramedullary alignment was used to make a cut of 9 mm off the least  affected lateral tibial plateau.  Tricompartmental  arthritis was present, however.  This cut was later revised 2 more millimeters.  A 10 mm cut was made off the distal femur due to the preoperative flexion contracture.  Cut 5 degrees of valgus.  The patient achieved good extension with both the 9 and 11  mm spacer block.  Femur sized to a size 3.  Anterior, posterior chamfer cuts were made.  Flexion and extension gaps were symmetric.  Tibia was then placed.  Bone quality on the femur was excellent, bone quality on the tibia was also good, but decision at  that time was made to do a press-fit to do a cemented tibia.  Tibia was prepared.  Trial components placed.  The patient had about 2 degrees of hyperextension with an 11 mm spacer.  Patella was cut down from 24 to 14 and a 3-peg trial of 32 patellar  button was placed.  The patient had very good alignment, excellent range of motion with no liftoff and good patellar tracking using no thumbs technique.  Trial components were removed.  Thorough irrigation performed.  Tranexamic acid sponge allowed to  sit for 3 minutes within the incision.  IrriSept solution also utilized after the incision, after the arthrotomy and multiple times during the case.  Tibia was cemented into position with good alignment maintained.  Press-fit femur was placed and the  patella was cemented.  All in all, excellent alignment and stability was maintained.  An 11 mm spacer was placed, which seated well.  Tourniquet was released.  Bleeding points encountered were controlled using electrocautery.  Thorough irrigation  again  performed with point irrigation.  Arthrotomy closed over a bolster using #1 Vicryl suture.  IrriSept solution, vancomycin powder placed within the knee joint prior to final closure of the arthrotomy.  Then, a solution of Marcaine, morphine, clonidine  injected into the knee joint.  It should be noted that a solution of Marcaine, saline and Exparel was injected into  the capsule of the knee joint prior to placement of implants.  After this, vancomycin power was placed into the joint and Irrisept  solution and vancomycin powder were then placed above the arthrotomy.  The incision was then closed using 0 Vicryl suture, 2-0 Vicryl suture, and 3-0 Monocryl with Steri-Strips and Aquacel applied.  The patient tolerated the procedure well without  immediate complication and transferred to the recovery room in stable condition.  Luke's assistance was required at all times during the case for retraction, opening, closing, mobilization of tissue.  His assistance was a medical necessity.   NIK D: 12/11/2020 10:22:28 am T: 12/12/2020 1:43:00 am  JOB: [id number]/ [id number]

## 2020-12-12 NOTE — Progress Notes (Signed)
  Subjective: Patient stable.  Pain controlled.  She is able to do a straight leg raise this morning.   Objective: Vital signs in last 24 hours: Temp:  [97.6 F (36.4 C)-97.8 F (36.6 C)] 97.8 F (36.6 C) (04/26 1743) Pulse Rate:  [57-69] 63 (04/26 1743) Resp:  [13-23] 15 (04/26 1743) BP: (82-109)/(42-56) 101/56 (04/26 1743) SpO2:  [96 %-100 %] 97 % (04/26 1743)  Intake/Output from previous day: 04/26 0701 - 04/27 0700 In: 1597.9 [P.O.:240; I.V.:1057.9; IV Piggyback:300] Out: 51 [Urine:1; Blood:50] Intake/Output this shift: No intake/output data recorded.  Exam:  Sensation intact distally Intact pulses distally Compartment soft  Labs: No results for input(s): HGB in the last 72 hours. No results for input(s): WBC, RBC, HCT, PLT in the last 72 hours. No results for input(s): NA, K, CL, CO2, BUN, CREATININE, GLUCOSE, CALCIUM in the last 72 hours. No results for input(s): LABPT, INR in the last 72 hours.  Assessment/Plan: Plan at this time is physical therapy this morning and this afternoon.  Anticipate discharge home this afternoon pending assessment by therapy.  Encouraging that she can do a straight leg raise this morning.   Carol Tucker 12/12/2020, 7:46 AM

## 2020-12-12 NOTE — Progress Notes (Signed)
PT Cancellation Note  Patient Details Name: Carol Tucker MRN: [id number] DOB: 1941/04/13   Cancelled Treatment:    Reason Eval/Treat Not Completed: Other (comment).  Pt asked not to get up yet, has received anti nausea meds and no longer nauseated.  Retry as time and pt allow.   Ramond Dial 12/12/2020, 12:16 PM Mee Hives, PT MS Acute Rehab Dept. Number: [place] and [place]

## 2020-12-12 NOTE — Evaluation (Signed)
Physical Therapy Evaluation Patient Details Name: Carol Tucker MRN: [id number] DOB: [birth year] Today's Date: 12/12/2020   History of Present Illness  80 yo female with onset of R knee pain with failed conservative tx is admitted for TKA on 4/26.  Pt has PMHx:  CTS, migraine, chestpain, osteopenia, HTN, hematuria, basal cell CA, pre-diabetes  Clinical Impression  Pt was seen for mobility finally this PM with a good initial visit.  Pt is following instructions well for correction of gait pattern and to work on maneuvering walker in a confined space.  Up in chair at end of session with ice replaced in polar care container.  Follow along with her to climb stairs and work on safety with gait, daughter is able to observe since she works from home and is at work during her time at [employer] with pt.  Focus on safety and family education.    Follow Up Recommendations Home health PT;Supervision for mobility/OOB    Equipment Recommendations  Rolling walker with 5" wheels (has a walker in the room)    Recommendations for Other Services       Precautions / Restrictions Precautions Precautions: Fall Required Braces or Orthoses: Knee Immobilizer - Right Knee Immobilizer - Right: On when out of bed or walking;Discontinue once straight leg raise with < 10 degree lag Restrictions Weight Bearing Restrictions: Yes RLE Weight Bearing: Weight bearing as tolerated      Mobility  Bed Mobility Overal bed mobility: Needs Assistance Bed Mobility: Supine to Sit     Supine to sit: Min assist     General bed mobility comments: min assist to support RLE braced and encourage scooting out with trunk support    Transfers Overall transfer level: Needs assistance Equipment used: Rolling walker (2 wheeled);1 person hand held assist Transfers: Sit to/from Stand Sit to Stand: Min assist         General transfer comment: min assist with cues for hand placement  Ambulation/Gait Ambulation/Gait  assistance: Min assist Gait Distance (Feet): 45 Feet Assistive device: Rolling walker (2 wheeled);1 person hand held assist Gait Pattern/deviations: Step-through pattern;Step-to pattern;Decreased stride length;Decreased weight shift to right;Wide base of support Gait velocity: reduced Gait velocity interpretation: <1.31 ft/sec, indicative of household ambulator General Gait Details: pt used immob to support gait and took more steps with less instability due to brace  Stairs            Wheelchair Mobility    Modified Rankin (Stroke Patients Only)       Balance Overall balance assessment: History of Falls;Needs assistance Sitting-balance support: Feet supported Sitting balance-Leahy Scale: Fair     Standing balance support: Bilateral upper extremity supported;During functional activity Standing balance-Leahy Scale: Poor                               Pertinent Vitals/Pain Pain Assessment: No/denies pain    Home Living Family/patient expects to be discharged to:: Private residence Living Arrangements: Children Available Help at Discharge: Family;Available 24 hours/day Type of Home: House Home Access: Stairs to enter Entrance Stairs-Rails: None Entrance Stairs-Number of Steps: 2+1 Home Layout: One level Home Equipment: Environmental consultant - 2 wheels;Cane - single point;Shower seat Additional Comments: has equipment from her husband's TKA's    Prior Function Level of Independence: Independent;Independent with assistive device(s)         Comments: had RW for previous use as needed     Hand Dominance   Dominant Hand: Right  Extremity/Trunk Assessment   Upper Extremity Assessment Upper Extremity Assessment: Overall WFL for tasks assessed    Lower Extremity Assessment Lower Extremity Assessment: RLE deficits/detail RLE Deficits / Details: pain and edema limit ROM of R knee RLE: Unable to fully assess due to immobilization RLE Coordination: decreased gross  motor    Cervical / Trunk Assessment Cervical / Trunk Assessment: Kyphotic  Communication   Communication: No difficulties  Cognition Arousal/Alertness: Awake/alert Behavior During Therapy: WFL for tasks assessed/performed Overall Cognitive Status: Within Functional Limits for tasks assessed                                        General Comments General comments (skin integrity, edema, etc.): pt is up to walk with help and noted step to pattern that she corrected to step through with cues    Exercises     Assessment/Plan    PT Assessment Patient needs continued PT services  PT Problem List Decreased strength;Decreased range of motion;Decreased activity tolerance;Decreased balance;Decreased mobility;Decreased coordination;Decreased knowledge of use of DME;Decreased safety awareness;Decreased skin integrity;Pain       PT Treatment Interventions DME instruction;Gait training;Stair training;Functional mobility training;Therapeutic activities;Therapeutic exercise;Balance training;Neuromuscular re-education;Patient/family education    PT Goals (Current goals can be found in the Care Plan section)  Acute Rehab PT Goals Patient Stated Goal: to go home and feel better, not nauseated PT Goal Formulation: With patient/family Time For Goal Achievement: 12/19/20 Potential to Achieve Goals: Good    Frequency 7X/week   Barriers to discharge Inaccessible home environment has stairs to enter house    Co-evaluation               AM-PAC PT "6 Clicks" Mobility  Outcome Measure Help needed turning from your back to your side while in a flat bed without using bedrails?: A Little Help needed moving from lying on your back to sitting on the side of a flat bed without using bedrails?: A Little Help needed moving to and from a bed to a chair (including a wheelchair)?: A Little Help needed standing up from a chair using your arms (e.g., wheelchair or bedside chair)?: A  Little Help needed to walk in hospital room?: A Little Help needed climbing 3-5 steps with a railing? : A Lot 6 Click Score: 17    End of Session Equipment Utilized During Treatment: Gait belt;Right knee immobilizer Activity Tolerance: Patient limited by pain;Patient limited by fatigue Patient left: in chair;with call bell/phone within reach;with chair alarm set;with family/visitor present Nurse Communication: Mobility status PT Visit Diagnosis: Unsteadiness on feet (R26.81);Muscle weakness (generalized) (M62.81);Pain Pain - Right/Left: Right Pain - part of body: Knee    Time: 0092-3300 PT Time Calculation (min) (ACUTE ONLY): 27 min   Charges:   PT Evaluation $PT Eval Moderate Complexity: 1 Mod PT Treatments $Gait Training: 8-22 mins       Ramond Dial 12/12/2020, 3:49 PM Mee Hives, PT MS Acute Rehab Dept. Number: [place] [facility] [place]

## 2020-12-13 DIAGNOSIS — M1711 Unilateral primary osteoarthritis, right knee: Secondary | ICD-10-CM

## 2020-12-13 MED ORDER — TRAMADOL HCL 50 MG PO TABS: 50.0000 mg | ORAL_TABLET | Freq: Four times a day (QID) | ORAL | 0 refills | Status: DC | PRN

## 2020-12-13 MED ORDER — METHOCARBAMOL 500 MG PO TABS: 500.0000 mg | ORAL_TABLET | Freq: Three times a day (TID) | ORAL | 0 refills | Status: DC | PRN

## 2020-12-13 MED ORDER — ASPIRIN 81 MG PO CHEW: 81.0000 mg | CHEWABLE_TABLET | Freq: Two times a day (BID) | ORAL | 0 refills | Status: DC

## 2020-12-13 NOTE — Progress Notes (Signed)
Physical Therapy Treatment Patient Details Name: Carol Tucker MRN: [id number] DOB: [birth year] Today's Date: 12/13/2020    History of Present Illness 80 yo female with onset of R knee pain with failed conservative tx is admitted for TKA on 4/26.  Pt has PMHx:  CTS, migraine, chestpain, osteopenia, HTN, hematuria, basal cell CA, pre-diabetes    PT Comments    The pt was able to demo good continued progress with mobility and activity tolerance in session with focus on exercises and education for return home. The pt was able to demo good technique with all exercises, verbalized understanding of technique and frequencies. The pt was able to complete multiple transfers and short bout of ambulation in the room with good stability, supervision-modI level with set up (placement of RW close or assisting with donning of KI). The pt expresses no further questions or concerns regarding mobility, is safe to return home with family support this afternoon.    Follow Up Recommendations  Home health PT;Supervision for mobility/OOB     Equipment Recommendations  3in1 (PT)    Recommendations for Other Services       Precautions / Restrictions Precautions Precautions: Fall Required Braces or Orthoses: Knee Immobilizer - Right Knee Immobilizer - Right: On when out of bed or walking;Discontinue once straight leg raise with < 10 degree lag Restrictions Weight Bearing Restrictions: Yes RLE Weight Bearing: Weight bearing as tolerated    Mobility  Bed Mobility Overal bed mobility: Needs Assistance             General bed mobility comments: pt OOB in recliner at start and end of session    Transfers Overall transfer level: Needs assistance Equipment used: Rolling walker (2 wheeled) Transfers: Sit to/from Stand Sit to Stand: Min guard         General transfer comment: minG with cues for hand placement to control eccentric lower  Ambulation/Gait Ambulation/Gait assistance: Min  guard Gait Distance (Feet): 15 Feet Assistive device: Rolling walker (2 wheeled) Gait Pattern/deviations: Step-through pattern;Decreased weight shift to right;Wide base of support;Decreased stance time - right;Decreased stride length;Antalgic     General Gait Details: pt using immobilizer, remains with antalgic gait with decreased wt acceptance onto RLE, slight R hip hike to clear with immobilizer. reliance on BUE support on RW         Balance Overall balance assessment: History of Falls;Needs assistance Sitting-balance support: Feet supported Sitting balance-Leahy Scale: Fair     Standing balance support: Bilateral upper extremity supported;During functional activity Standing balance-Leahy Scale: Poor Standing balance comment: reliant on BUE support                            Cognition Arousal/Alertness: Awake/alert Behavior During Therapy: WFL for tasks assessed/performed Overall Cognitive Status: Within Functional Limits for tasks assessed                                        Exercises Total Joint Exercises Ankle Circles/Pumps: AROM;Both;10 reps Quad Sets: AROM;Right;10 reps;Seated Heel Slides: AAROM;AROM;Right;10 reps;Seated Hip ABduction/ADduction: AROM;Right;10 reps;Standing Straight Leg Raises: AAROM;Right;5 reps;Seated Long Arc Quad: AAROM;Right;10 reps;Seated Knee Flexion: AAROM;10 reps;Seated Goniometric ROM: -15 deg ext to 50 deg flexion Standing Hip Extension: AROM;Right;10 reps;Standing        Pertinent Vitals/Pain Pain Assessment: No/denies pain           PT Goals (current  goals can now be found in the care plan section) Acute Rehab PT Goals Patient Stated Goal: to go home and feel better, not nauseated PT Goal Formulation: With patient/family Time For Goal Achievement: 12/19/20 Potential to Achieve Goals: Good Progress towards PT goals: Progressing toward goals    Frequency    7X/week      PT Plan Current  plan remains appropriate       AM-PAC PT "6 Clicks" Mobility   Outcome Measure  Help needed turning from your back to your side while in a flat bed without using bedrails?: None Help needed moving from lying on your back to sitting on the side of a flat bed without using bedrails?: None Help needed moving to and from a bed to a chair (including a wheelchair)?: A Little Help needed standing up from a chair using your arms (e.g., wheelchair or bedside chair)?: A Little Help needed to walk in hospital room?: A Little Help needed climbing 3-5 steps with a railing? : A Little 6 Click Score: 20    End of Session Equipment Utilized During Treatment: Gait belt;Right knee immobilizer Activity Tolerance: Patient limited by pain;Patient limited by fatigue Patient left: in chair;with call bell/phone within reach;with chair alarm set;with family/visitor present Nurse Communication: Mobility status PT Visit Diagnosis: Unsteadiness on feet (R26.81);Muscle weakness (generalized) (M62.81);Pain Pain - Right/Left: Right Pain - part of body: Knee     Time: 1208-1237 PT Time Calculation (min) (ACUTE ONLY): 29 min  Charges:  $Therapeutic Exercise: 8-22 mins $Therapeutic Activity: 8-22 mins                     Karma Ganja, PT, DPT   Acute Rehabilitation Department Pager #: [phone]   Otho Bellows 12/13/2020, 12:47 PM

## 2020-12-13 NOTE — Plan of Care (Addendum)
Patient ambulates well to and from [facility]. Pain controlled with PO meds. Patient educated to use CPM to help with healing and prevent stiffness. Pt refused for night due to pain but educated to use in the morning and will premedicate with pain med if needed to encourage use. Ice man in use and refilled x3. Will continue to monitor patient.  Attempt to place pt in CPM, 0630, pt requested pain medication prior to. Patient ambulated to [facility], ice man refilled, pain medication given. Educated patient we will place on CPM at shift change. Pt agreed.   Patient placed on CPM at [place], patient tolerating well, told patient to let on coming nurse know if she can tolerate more range of motion.  Problem: Education: Goal: Knowledge of General Education information will improve Description: Including pain rating scale, medication(s)/side effects and non-pharmacologic comfort measures Outcome: Progressing   Problem: Activity: Goal: Risk for activity intolerance will decrease Outcome: Progressing   Problem: Pain Managment: Goal: General experience of comfort will improve Outcome: Progressing   Problem: Safety: Goal: Ability to remain free from injury will improve Outcome: Progressing   Problem: Skin Integrity: Goal: Risk for impaired skin integrity will decrease Outcome: Progressing

## 2020-12-13 NOTE — Progress Notes (Signed)
  Subjective: Carol Tucker is a 80 y.o. female s/p right TKA.  They are POD2.  Pt's pain is controlled.  Pt denies numbness/tingling/weakness.  Pt has ambulated with some difficulty.   Did stairs in PT today.  Denies nausea, lightheadeness, dizziness, SOB, CP.  Objective: Vital signs in last 24 hours: Temp:  [98.1 F (36.7 C)-99.5 F (37.5 C)] 98.2 F (36.8 C) (04/28 0959) Pulse Rate:  [62-86] 69 (04/28 0959) Resp:  [17-20] 20 (04/28 0959) BP: (126-143)/(51-56) 143/56 (04/28 0959) SpO2:  [97 %] 97 % (04/28 0959)  Intake/Output from previous day: 04/27 0701 - 04/28 0700 In: 2971.2 [P.O.:360; I.V.:2511.2; IV Piggyback:100] Out: -  Intake/Output this shift: No intake/output data recorded.  Exam:  No gross blood or drainage overlying the dressing 1+ DP pulse Sensation intact distally in the right foot Able to dorsiflex and plantarflex the right foot No calf tenderness   Labs: No results for input(s): HGB in the last 72 hours. No results for input(s): WBC, RBC, HCT, PLT in the last 72 hours. No results for input(s): NA, K, CL, CO2, BUN, CREATININE, GLUCOSE, CALCIUM in the last 72 hours. No results for input(s): LABPT, INR in the last 72 hours.  Assessment/Plan: Pt is POD2 s/p right TKA.    -Plan to discharge to home today   -WBAT with a walker   Donella Stade 12/13/2020, 12:13 PM

## 2020-12-13 NOTE — Progress Notes (Signed)
Discharge instructions given. Patient verbalized understanding and all questions were answered.  ?

## 2020-12-13 NOTE — Progress Notes (Signed)
Physical Therapy Treatment Patient Details Name: Carol Tucker MRN: [id number] DOB: 05/13/1941 Today's Date: 12/13/2020    History of Present Illness 80 yo female with onset of R knee pain with failed conservative tx is admitted for TKA on 4/26.  Pt has PMHx:  CTS, migraine, chestpain, osteopenia, HTN, hematuria, basal cell CA, pre-diabetes    PT Comments    The pt is making good progress with mobility and was able to progress to complete x3 steps with use of RW but no physical assist/support this morning. The pt was able to tolerate increased ambulation distance as well as stair training with no reports of change in pain. The pt was able to mobilize with RW, but without additional physical assist, and will be safe to d/c home with family for supervision with mobility. The pt was educated on exercises to improve R knee ROM, but will continue to benefit from skilled PT acutely and following d/c to facilitate return to independence with mobility without use of AD.   I plan to return later this morning for second session focused on transfers, HEP, and final education, but anticipate pt will be ready for d/c once medically cleared.     Follow Up Recommendations  Home health PT;Supervision for mobility/OOB     Equipment Recommendations  Rolling walker with 5" wheels    Recommendations for Other Services       Precautions / Restrictions Precautions Precautions: Fall Required Braces or Orthoses: Knee Immobilizer - Right Knee Immobilizer - Right: On when out of bed or walking;Discontinue once straight leg raise with < 10 degree lag Restrictions Weight Bearing Restrictions: Yes RLE Weight Bearing: Weight bearing as tolerated    Mobility  Bed Mobility Overal bed mobility: Needs Assistance Bed Mobility: Supine to Sit     Supine to sit: Min guard     General bed mobility comments: minG for safety, no use of bed rails or assist to manage RLE    Transfers Overall transfer  level: Needs assistance Equipment used: Rolling walker (2 wheeled) Transfers: Sit to/from Stand Sit to Stand: Min guard         General transfer comment: minG with cues for hand placement to control eccentric lower  Ambulation/Gait Ambulation/Gait assistance: Min guard Gait Distance (Feet): 50 Feet Assistive device: Rolling walker (2 wheeled) Gait Pattern/deviations: Step-through pattern;Decreased weight shift to right;Wide base of support;Decreased stance time - right;Decreased stride length;Antalgic Gait velocity: 0.27 m/s Gait velocity interpretation: <1.31 ft/sec, indicative of household ambulator General Gait Details: pt using immobilizer, remains with antalgic gait with decreased wt acceptance onto RLE, slight R hip hike to clear with immobilizer. reliance on BUE support on RW   Stairs Stairs: Yes Stairs assistance: Min guard Stair Management: No rails;Backwards;With walker Number of Stairs: 2 General stair comments: instructed in backwards technique with BUE support on RW, +1 assist for management of RW only, no physical assist to pt. good stability      Balance Overall balance assessment: History of Falls;Needs assistance Sitting-balance support: Feet supported Sitting balance-Leahy Scale: Fair     Standing balance support: Bilateral upper extremity supported;During functional activity Standing balance-Leahy Scale: Poor Standing balance comment: reliant on BUE support                            Cognition Arousal/Alertness: Awake/alert Behavior During Therapy: WFL for tasks assessed/performed Overall Cognitive Status: Within Functional Limits for tasks assessed  Exercises Total Joint Exercises Ankle Circles/Pumps: AROM;Both;10 reps Quad Sets: AROM;Right;10 reps;Seated Heel Slides: AAROM;AROM;Right;10 reps;Seated Long Arc Quad: AAROM;Right;10 reps;Seated Knee Flexion: AAROM;10  reps;Seated Goniometric ROM: -15 deg ext to 50 deg flexion        Pertinent Vitals/Pain Pain Assessment: No/denies pain           PT Goals (current goals can now be found in the care plan section) Acute Rehab PT Goals Patient Stated Goal: to go home and feel better, not nauseated PT Goal Formulation: With patient/family Time For Goal Achievement: 12/19/20 Potential to Achieve Goals: Good Progress towards PT goals: Progressing toward goals    Frequency    7X/week      PT Plan Current plan remains appropriate       AM-PAC PT "6 Clicks" Mobility   Outcome Measure  Help needed turning from your back to your side while in a flat bed without using bedrails?: None Help needed moving from lying on your back to sitting on the side of a flat bed without using bedrails?: None Help needed moving to and from a bed to a chair (including a wheelchair)?: A Little Help needed standing up from a chair using your arms (e.g., wheelchair or bedside chair)?: A Little Help needed to walk in hospital room?: A Little Help needed climbing 3-5 steps with a railing? : A Little 6 Click Score: 20    End of Session Equipment Utilized During Treatment: Gait belt;Right knee immobilizer Activity Tolerance: Patient limited by pain;Patient limited by fatigue Patient left: in chair;with call bell/phone within reach;with chair alarm set;with family/visitor present Nurse Communication: Mobility status PT Visit Diagnosis: Unsteadiness on feet (R26.81);Muscle weakness (generalized) (M62.81);Pain Pain - Right/Left: Right Pain - part of body: Knee     Time: 3790-2409 PT Time Calculation (min) (ACUTE ONLY): 30 min  Charges:  $Gait Training: 23-37 mins                     Karma Ganja, PT, DPT   Acute Rehabilitation Department Pager #: [phone]   Otho Bellows 12/13/2020, 8:49 AM

## 2020-12-20 NOTE — Discharge Summary (Signed)
Physician Discharge Summary      Patient ID: Carol Tucker MRN: [id number] DOB/AGE: Mar 05, 1941 80 y.o.  Admit date: 12/11/2020 Discharge date: 12/13/2020  Admission Diagnoses:  Active Problems:   S/P total knee arthroplasty, right   Arthritis of right knee   Discharge Diagnoses:  Same  Surgeries: Procedure(s): RIGHT TOTAL KNEE ARTHROPLASTY on 12/11/2020   Consultants:   Discharged Condition: Stable  Hospital Course: Carol Tucker is an 80 y.o. female who was admitted 12/11/2020 with a chief complaint of right knee pain, and found to have a diagnosis of right knee arthritis.  They were brought to the operating room on 12/11/2020 and underwent the above named procedures.  Pt awoke from anesthesia without complication and was transferred to the floor. On POD1, patient had persistent complaint of nausea but her pain was well controlled and she was able to do straight leg raise on physical exam.  On POD 2, her nausea was vastly improved and she did well with therapy to the point where she felt comfortable with discharge.  She was discharged home on POD 2..  Pt will f/u with Dr. Marlou Sa in clinic in ~2 weeks.   Antibiotics given:  Anti-infectives (From admission, onward)   Start     Dose/Rate Route Frequency Ordered Stop   12/11/20 1530  ceFAZolin (ANCEF) 1 g in sodium chloride 0.9 % 100 mL IVPB        1 g 200 mL/hr over 30 Minutes Intravenous Every 8 hours 12/11/20 1355 12/12/20 0206   12/11/20 0856  vancomycin (VANCOCIN) powder  Status:  Discontinued          As needed 12/11/20 0856 12/11/20 1012   12/11/20 0600  ceFAZolin (ANCEF) IVPB 2g/100 mL premix        2 g 200 mL/hr over 30 Minutes Intravenous On call to O.R. 12/11/20 [id number] 12/11/20 [id number]    .  Recent vital signs:  Vitals:   12/13/20 0400 12/13/20 0959  BP: (!) 126/52 (!) 143/56  Pulse: 62 69  Resp: 18 20  Temp: 98.3 F (36.8 C) 98.2 F (36.8 C)  SpO2: 97% 97%    Recent laboratory studies:  Results for  orders placed or performed during the hospital encounter of 12/07/20  SARS CORONAVIRUS 2 (TAT 6-24 HRS) Nasopharyngeal Nasopharyngeal Swab   Specimen: Nasopharyngeal Swab  Result Value Ref Range   SARS Coronavirus 2 NEGATIVE NEGATIVE  Urine culture   Specimen: Urine, Clean Catch  Result Value Ref Range   Specimen Description URINE, CLEAN CATCH    Special Requests NONE    Culture (A)     <10,000 COLONIES/mL INSIGNIFICANT GROWTH Performed at [facility], [address], [place], [place] [address]    Report Status 12/08/2020 FINAL   Surgical pcr screen   Specimen: Nasal Mucosa; Nasal Swab  Result Value Ref Range   MRSA, PCR NEGATIVE NEGATIVE   Staphylococcus aureus NEGATIVE NEGATIVE  CBC  Result Value Ref Range   WBC 6.9 4.0 - 10.5 K/uL   RBC 3.54 (L) 3.87 - 5.11 MIL/uL   Hemoglobin 12.2 12.0 - 15.0 g/dL   HCT 34.7 (L) 36.0 - 46.0 %   MCV 98.0 80.0 - 100.0 fL   MCH 34.5 (H) 26.0 - 34.0 pg   MCHC 35.2 30.0 - 36.0 g/dL   RDW 13.2 11.5 - 15.5 %   Platelets 255 150 - 400 K/uL   nRBC 0.0 0.0 - 0.2 %  Basic metabolic panel  Result Value Ref Range  Sodium 138 135 - 145 mmol/L   Potassium 4.6 3.5 - 5.1 mmol/L   Chloride 106 98 - 111 mmol/L   CO2 25 22 - 32 mmol/L   Glucose, Bld 102 (H) 70 - 99 mg/dL   BUN 18 8 - 23 mg/dL   Creatinine, Ser 0.91 0.44 - 1.00 mg/dL   Calcium 9.5 8.9 - 10.3 mg/dL   GFR, Estimated >60 >60 mL/min   Anion gap 7 5 - 15  Urinalysis, Routine w reflex microscopic  Result Value Ref Range   Color, Urine STRAW (A) YELLOW   APPearance CLEAR CLEAR   Specific Gravity, Urine 1.004 (L) 1.005 - 1.030   pH 5.0 5.0 - 8.0   Glucose, UA NEGATIVE NEGATIVE mg/dL   Hgb urine dipstick NEGATIVE NEGATIVE   Bilirubin Urine NEGATIVE NEGATIVE   Ketones, ur NEGATIVE NEGATIVE mg/dL   Protein, ur NEGATIVE NEGATIVE mg/dL   Nitrite NEGATIVE NEGATIVE   Leukocytes,Ua NEGATIVE NEGATIVE    Discharge Medications:   Allergies as of 12/13/2020      Reactions   Oxycodone  Nausea Only   Metoclopramide Hcl    REACTION: tremors, clinched jaws     REGLAN      Medication List    TAKE these medications   aspirin 81 MG chewable tablet Chew 1 tablet (81 mg total) by mouth 2 (two) times daily.   calcium-vitamin D 500-200 MG-UNIT tablet Commonly known as: OSCAL WITH D Take 1 tablet by mouth daily with breakfast.   famotidine 20 MG tablet Commonly known as: PEPCID TAKE 1 TABLET BY MOUTH TWICE DAILY What changed:   when to take this  reasons to take this   ketotifen 0.025 % ophthalmic solution Commonly known as: ZADITOR Place 1 drop into both eyes daily as needed (Itchy eyes).   lisinopril 20 MG tablet Commonly known as: ZESTRIL Take 1 tablet (20 mg total) by mouth daily. What changed: how much to take   methocarbamol 500 MG tablet Commonly known as: ROBAXIN Take 1 tablet (500 mg total) by mouth every 8 (eight) hours as needed for muscle spasms.   pravastatin 40 MG tablet Commonly known as: PRAVACHOL TAKE 1 TABLET BY MOUTH EVERY DAY   PreserVision AREDS 2 Caps Take 1 Cartridge by mouth 2 (two) times daily.   traMADol 50 MG tablet Commonly known as: ULTRAM Take 1-2 tablets (50-100 mg total) by mouth every 6 (six) hours as needed for moderate pain.       Diagnostic Studies: No results found.  Disposition: Discharge disposition: 01-Home or Self Care       Discharge Instructions    Call MD / Call 911   Complete by: As directed    If you experience chest pain or shortness of breath, CALL 911 and be transported to the hospital emergency room.  If you develope a fever above 101 F, pus (white drainage) or increased drainage or redness at the wound, or calf pain, call your surgeon's office.   Constipation Prevention   Complete by: As directed    Drink plenty of fluids.  Prune juice may be helpful.  You may use a stool softener, such as Colace (over the counter) 100 mg twice a day.  Use MiraLax (over the counter) for constipation as needed.    Diet - low sodium heart healthy   Complete by: As directed    Discharge instructions   Complete by: As directed    From Dr. Marlou Sa and Lurena Joiner: You may shower, dressing is waterproof.  Do not remove the dressing, we will remove it at your first post-op appointment.  Do not take a bath or soak the knee in a tub or pool.  You may weightbear as you can tolerate on the operative leg with a walker.  Continue using the CPM machine 3 times per day for one hour each time, increasing the degrees of range of motion daily.  Use the blue cradle boot under your heel to work on getting your leg straight.  Do NOT put a pillow under your knee.  You will follow-up with Dr. Marlou Sa in the clinic in 2 weeks at your given appointment date.    INSTRUCTIONS AFTER JOINT REPLACEMENT   Remove items at home which could result in a fall. This includes throw rugs or furniture in walking pathways ICE to the affected joint every three hours while awake for 30 minutes at a time, for at least the first 3-5 days, and then as needed for pain and swelling.  Continue to use ice for pain and swelling. You may notice swelling that will progress down to the foot and ankle.  This is normal after surgery.  Elevate your leg when you are not up walking on it.   Continue to use the breathing machine you got in the hospital (incentive spirometer) which will help keep your temperature down.  It is common for your temperature to cycle up and down following surgery, especially at night when you are not up moving around and exerting yourself.  The breathing machine keeps your lungs expanded and your temperature down.   DIET:  As you were doing prior to hospitalization, we recommend a well-balanced diet.  DRESSING / WOUND CARE / SHOWERING  Keep the surgical dressing until follow up.  The dressing is water proof, so you can shower without any extra covering.  IF THE DRESSING FALLS OFF or the wound gets wet inside, change the dressing with sterile gauze.   Please use good hand washing techniques before changing the dressing.  Do not use any lotions or creams on the incision until instructed by your surgeon.    ACTIVITY  Increase activity slowly as tolerated, but follow the weight bearing instructions below.   No driving for 6 weeks or until further direction given by your physician.  You cannot drive while taking narcotics.  No lifting or carrying greater than 10 lbs. until further directed by your surgeon. Avoid periods of inactivity such as sitting longer than an hour when not asleep. This helps prevent blood clots.  You may return to work once you are authorized by your doctor.     WEIGHT BEARING   Weight bearing as tolerated with assist device (walker, cane, etc) as directed, use it as long as suggested by your surgeon or therapist, typically at least 4-6 weeks.   EXERCISES  Results after joint replacement surgery are often greatly improved when you follow the exercise, range of motion and muscle strengthening exercises prescribed by your doctor. Safety measures are also important to protect the joint from further injury. Any time any of these exercises cause you to have increased pain or swelling, decrease what you are doing until you are comfortable again and then slowly increase them. If you have problems or questions, call your caregiver or physical therapist for advice.   Rehabilitation is important following a joint replacement. After just a few days of immobilization, the muscles of the leg can become weakened and shrink (atrophy).  These exercises are designed to  build up the tone and strength of the thigh and leg muscles and to improve motion. Often times heat used for twenty to thirty minutes before working out will loosen up your tissues and help with improving the range of motion but do not use heat for the first two weeks following surgery (sometimes heat can increase post-operative swelling).   These exercises can be done on a  training (exercise) mat, on the floor, on a table or on a bed. Use whatever works the best and is most comfortable for you.    Use music or television while you are exercising so that the exercises are a pleasant break in your day. This will make your life better with the exercises acting as a break in your routine that you can look forward to.   Perform all exercises about fifteen times, three times per day or as directed.  You should exercise both the operative leg and the other leg as well.  Exercises include:   Quad Sets - Tighten up the muscle on the front of the thigh (Quad) and hold for 5-10 seconds.   Straight Leg Raises - With your knee straight (if you were given a brace, keep it on), lift the leg to 60 degrees, hold for 3 seconds, and slowly lower the leg.  Perform this exercise against resistance later as your leg gets stronger.  Leg Slides: Lying on your back, slowly slide your foot toward your buttocks, bending your knee up off the floor (only go as far as is comfortable). Then slowly slide your foot back down until your leg is flat on the floor again.  Angel Wings: Lying on your back spread your legs to the side as far apart as you can without causing discomfort.  Hamstring Strength:  Lying on your back, push your heel against the floor with your leg straight by tightening up the muscles of your buttocks.  Repeat, but this time bend your knee to a comfortable angle, and push your heel against the floor.  You may put a pillow under the heel to make it more comfortable if necessary.   A rehabilitation program following joint replacement surgery can speed recovery and prevent re-injury in the future due to weakened muscles. Contact your doctor or a physical therapist for more information on knee rehabilitation.    CONSTIPATION  Constipation is defined medically as fewer than three stools per week and severe constipation as less than one stool per week.  Even if you have a regular bowel  pattern at home, your normal regimen is likely to be disrupted due to multiple reasons following surgery.  Combination of anesthesia, postoperative narcotics, change in appetite and fluid intake all can affect your bowels.   YOU MUST use at least one of the following options; they are listed in order of increasing strength to get the job done.  They are all available over the counter, and you may need to use some, POSSIBLY even all of these options:    Drink plenty of fluids (prune juice may be helpful) and high fiber foods Colace 100 mg by mouth twice a day  Senokot for constipation as directed and as needed Dulcolax (bisacodyl), take with full glass of water  Miralax (polyethylene glycol) once or twice a day as needed.  If you have tried all these things and are unable to have a bowel movement in the first 3-4 days after surgery call either your surgeon or your primary doctor.    If  you experience loose stools or diarrhea, hold the medications until you stool forms back up.  If your symptoms do not get better within 1 week or if they get worse, check with your doctor.  If you experience "the worst abdominal pain ever" or develop nausea or vomiting, please contact the office immediately for further recommendations for treatment.   ITCHING:  If you experience itching with your medications, try taking only a single pain pill, or even half a pain pill at a time.  You can also use Benadryl over the counter for itching or also to help with sleep.   TED HOSE STOCKINGS:  Use stockings on both legs until for at least 2 weeks or as directed by physician office. They may be removed at night for sleeping.  MEDICATIONS:  See your medication summary on the "After Visit Summary" that nursing will review with you.  You may have some home medications which will be placed on hold until you complete the course of blood thinner medication.  It is important for you to complete the blood thinner medication as  prescribed.  PRECAUTIONS:  If you experience chest pain or shortness of breath - call 911 immediately for transfer to the hospital emergency department.   If you develop a fever greater that 101 F, purulent drainage from wound, increased redness or drainage from wound, foul odor from the wound/dressing, or calf pain - CONTACT YOUR SURGEON.                                                   FOLLOW-UP APPOINTMENTS:  If you do not already have a post-op appointment, please call the office for an appointment to be seen by your surgeon.  Guidelines for how soon to be seen are listed in your "After Visit Summary", but are typically between 1-4 weeks after surgery.  OTHER INSTRUCTIONS:   Knee Replacement:  Do not place pillow under knee, focus on keeping the knee straight while resting. CPM instructions: 0-90 degrees, 2 hours in the morning, 2 hours in the afternoon, and 2 hours in the evening. Place foam block, curve side up under heel at all times except when in CPM or when walking.  DO NOT modify, tear, cut, or change the foam block in any way.  POST-OPERATIVE OPIOID TAPER INSTRUCTIONS: It is important to wean off of your opioid medication as soon as possible. If you do not need pain medication after your surgery it is ok to stop day one. Opioids include: Codeine, Hydrocodone(Norco, Vicodin), Oxycodone(Percocet, oxycontin) and hydromorphone amongst others.  Long term and even short term use of opiods can cause: Increased pain response Dependence Constipation Depression Respiratory depression And more.  Withdrawal symptoms can include Flu like symptoms Nausea, vomiting And more Techniques to manage these symptoms Hydrate well Eat regular healthy meals Stay active Use relaxation techniques(deep breathing, meditating, yoga) Do Not substitute Alcohol to help with tapering If you have been on opioids for less than two weeks and do not have pain than it is ok to stop all together.  Plan to  wean off of opioids This plan should start within one week post op of your joint replacement. Maintain the same interval or time between taking each dose and first decrease the dose.  Cut the total daily intake of opioids by one tablet each day Next start  to increase the time between doses. The last dose that should be eliminated is the evening dose.   MAKE SURE YOU:  Understand these instructions.  Get help right away if you are not doing well or get worse.    Thank you for letting us be a part of your medical care team.  It is a privilege we respect greatly.  We hope these instructions will help you stay on track for a fast and full recovery!    Dental Antibiotics:  In most cases prophylactic antibiotics for Dental procdeures after total joint surgery are not necessary.  Exceptions are as follows:  1. History of prior total joint infection  2. Severely immunocompromised (Organ Transplant, cancer chemotherapy, Rheumatoid biologic meds such as [place])  3. Poorly controlled diabetes (A1C &gt; 8.0, blood glucose over 200)  If you have one of these conditions, contact your surgeon for an antibiotic prescription, prior to your dental procedure.   Increase activity slowly as tolerated   Complete by: As directed    Post-operative opioid taper instructions:   Complete by: As directed    POST-OPERATIVE OPIOID TAPER INSTRUCTIONS: It is important to wean off of your opioid medication as soon as possible. If you do not need pain medication after your surgery it is ok to stop day one. Opioids include: Codeine, Hydrocodone(Norco, Vicodin), Oxycodone(Percocet, oxycontin) and hydromorphone amongst others.  Long term and even short term use of opiods can cause: Increased pain response Dependence Constipation Depression Respiratory depression And more.  Withdrawal symptoms can include Flu like symptoms Nausea, vomiting And more Techniques to manage these symptoms Hydrate well Eat  regular healthy meals Stay active Use relaxation techniques(deep breathing, meditating, yoga) Do Not substitute Alcohol to help with tapering If you have been on opioids for less than two weeks and do not have pain than it is ok to stop all together.  Plan to wean off of opioids This plan should start within one week post op of your joint replacement. Maintain the same interval or time between taking each dose and first decrease the dose.  Cut the total daily intake of opioids by one tablet each day Next start to increase the time between doses. The last dose that should be eliminated is the evening dose.          Follow-up Information    [facility].   Specialty: [place] Why: home health services arranged with [facility], start of care within 48 hours post discharge Contact information: [address] [place] [address] [phone]                Signed: Donella Stade 12/20/2020, 7:28 AM

## 2020-12-21 ENCOUNTER — Other Ambulatory Visit: Payer: Self-pay | Admitting: Surgical

## 2020-12-21 ENCOUNTER — Telehealth: Payer: Self-pay | Admitting: Orthopedic Surgery

## 2020-12-21 ENCOUNTER — Telehealth: Payer: Self-pay

## 2020-12-21 MED ORDER — METHOCARBAMOL 500 MG PO TABS: 500.0000 mg | ORAL_TABLET | Freq: Three times a day (TID) | ORAL | 0 refills | Status: DC | PRN

## 2020-12-21 NOTE — Telephone Encounter (Signed)
Other msg routed to [facility]

## 2020-12-21 NOTE — Telephone Encounter (Signed)
Patient's daughter Neca called requesting a call back from [employer]. She states pt doesn't need another perscription but need medical advice. Please call pt daughter asap. At [phone].

## 2020-12-21 NOTE — Telephone Encounter (Signed)
Patients daughter Necacalled she stated the patient thinks she pulled a muscle in her hip, patient has not been sleeping well and aso hasn't been able to go to pt due to having pain and discomfort. Neca is requesting a rx for muscle relaxer's to be called into the pharmacy call back:[phone]

## 2020-12-21 NOTE — Telephone Encounter (Signed)
Pls advise.  

## 2020-12-21 NOTE — Telephone Encounter (Signed)
Sent in refill for robaxin

## 2020-12-21 NOTE — Telephone Encounter (Signed)
Patient called. Says the medication she has is not working. Would like a call back. [phone]

## 2020-12-21 NOTE — Telephone Encounter (Signed)
Another msg was sent...  "Patient's daughter Neca called requesting a call back from [employer]. She states pt doesn't need another perscription but need medical advice. Please call pt daughter asap. At [phone]."

## 2020-12-24 ENCOUNTER — Telehealth: Payer: Self-pay | Admitting: Orthopedic Surgery

## 2020-12-24 ENCOUNTER — Other Ambulatory Visit: Payer: Self-pay | Admitting: Surgical

## 2020-12-24 MED ORDER — CYCLOBENZAPRINE HCL 5 MG PO TABS: 5.0000 mg | ORAL_TABLET | Freq: Three times a day (TID) | ORAL | 0 refills | Status: AC | PRN

## 2020-12-24 NOTE — Telephone Encounter (Signed)
Called 2x and called her daughters number. Left VM with my cell number to call back

## 2020-12-24 NOTE — Telephone Encounter (Signed)
Patient called advised she was told that she can take up to 9 motrin a day and she do not want to do that. Patient said she think that is a large amount of motrin to take. Patient said she has done everything she was told and nothing is working to help with the pain. The number to contact patient is [phone]

## 2020-12-24 NOTE — Telephone Encounter (Signed)
Pls advise. Patient calling again.

## 2020-12-24 NOTE — Telephone Encounter (Signed)
Pt called and states that the methocarbamol isn't working. She states she has called several times and no one is returning her call. I let her know her message has been sent to the doctor and we must wait for their response. [phone]

## 2020-12-24 NOTE — Telephone Encounter (Signed)
Pls advise.  

## 2020-12-26 ENCOUNTER — Ambulatory Visit (INDEPENDENT_AMBULATORY_CARE_PROVIDER_SITE_OTHER): Payer: Medicare PPO | Admitting: Orthopedic Surgery

## 2020-12-26 ENCOUNTER — Encounter: Payer: Self-pay | Admitting: Orthopedic Surgery

## 2020-12-26 ENCOUNTER — Ambulatory Visit (INDEPENDENT_AMBULATORY_CARE_PROVIDER_SITE_OTHER): Payer: Medicare PPO

## 2020-12-26 ENCOUNTER — Other Ambulatory Visit: Payer: Self-pay | Admitting: Surgical

## 2020-12-26 DIAGNOSIS — M545 Low back pain, unspecified: Secondary | ICD-10-CM

## 2020-12-26 DIAGNOSIS — M79605 Pain in left leg: Secondary | ICD-10-CM

## 2020-12-26 DIAGNOSIS — Z96651 Presence of right artificial knee joint: Secondary | ICD-10-CM

## 2020-12-26 MED ORDER — ONDANSETRON HCL 4 MG PO TABS: 4.0000 mg | ORAL_TABLET | Freq: Three times a day (TID) | ORAL | 0 refills | Status: DC | PRN

## 2020-12-26 MED ORDER — OXYCODONE HCL 5 MG PO TABS: 5.0000 mg | ORAL_TABLET | Freq: Four times a day (QID) | ORAL | 0 refills | Status: DC | PRN

## 2020-12-26 NOTE — Progress Notes (Signed)
Office Visit Note   Patient: Carol Tucker           Date of Birth: 06/18/1941           MRN: [id number] Visit Date: 12/26/2020 Requested by: Shawnee Knapp, MD [place],   [id number] PCP: Pcp, No  Subjective: Chief Complaint  Patient presents with  . Other     S/p right TKA    HPI: [name] is a 80 year old patient who underwent right total knee replacement 12/11/2020.  She has been doing very well with that.  Has not been using a CPM machine due to some back pain.  Her back pain is her main complaint today.  Had prior episode of this about 2 years ago.  Had an MRI scan which we are getting the report from.  No history of epidural steroid injection that because her back to get better with a fall.  Her knee is doing well but her back is really hurting her on the left-hand side with no radicular pain and no numbness and tingling.  Flexeril has not been helpful and Robaxin is also not been helpful.  She was on Norco in the hospital which gave her nausea.  She stated today that she believes she took oxycodone with Mohs surgery many years ago and did not have too many side effects from that.  She was taking Ultram but stopped that due to constipation.  I called [facility] and we will get a request for the MRI L-spine report as soon as possible.  However the pain she is having currently is worse than the pain she was having 2 years ago.              ROS: All systems reviewed are negative as they relate to the chief complaint within the history of present illness.  Patient denies  fevers or chills.   Assessment & Plan: Visit Diagnoses:  1. Status post total right knee replacement   2. Pain in left leg     Plan: Impression is right total knee replacement doing well with good range of motion and good radiographs.  Main problem now is her left-sided low back pain which has no radicular component but is fairly focal on the left-hand side.  Plain radiographs today  unremarkable in terms of acute fracture.  She does have some facet arthritis in that region.  Oxycodone prescribed.  Therapy prescribed for [place] for knee range of motion and strengthening.  Need to schedule her an epidural steroid injection with Dr. Ernestina Patches once we get the MRI lumbar spine report from [facility].  If that is not possible then we will repeat the MRI scan of her lumbar spine to evaluate left-sided low back pain which is incapacitating.  3-week return for recheck on the knee.  Follow-Up Instructions: Return in about 3 weeks (around 01/16/2021).   Orders:  Orders Placed This Encounter  Procedures  . XR Knee 1-2 Views Right  . XR Lumbar Spine 2-3 Views  . MR Lumbar Spine w/o contrast  . Ambulatory referral to Physical Medicine Rehab   No orders of the defined types were placed in this encounter.     Procedures: No procedures performed   Clinical Data: No additional findings.  Objective: Vital Signs: There were no vitals taken for this visit.  Physical Exam:   Constitutional: Patient appears well-developed HEENT:  Head: Normocephalic Eyes:EOM are normal Neck: Normal range of motion Cardiovascular: Normal rate Pulmonary/chest:  Effort normal Neurologic: Patient is alert Skin: Skin is warm Psychiatric: Patient has normal mood and affect    Ortho Exam: Ortho exam of the right knee demonstrates intact incision.  Negative Homans no calf tenderness to direct palpation range of motion is 0-95.  Collaterals are stable to varus leg stress at 0 30 and 90 degrees.  She has no nerve root tension signs no groin pain with internal ex rotation of either leg.  She does have focal low back pain around the SI joint on that left-hand side.  Mild pain with forward lateral bending but no weakness with ankle dorsiflexion plantarflexion quad hamstring or hip flexion strength testing.  No definite paresthesias L1 S1 bilaterally.  Specialty Comments:  No specialty comments  available.  Imaging: XR Knee 1-2 Views Right  Result Date: 12/26/2020 AP lateral radiographs right knee reviewed.  Cemented total knee prosthesis in good position alignment with no complicating features.  No acute fracture.  XR Lumbar Spine 2-3 Views  Result Date: 12/26/2020 AP lateral lumbar spine radiographs reviewed.  Mild osteoarthritis between the vertebral bodies is present with moderate arthritis in the lumbar facet joints particularly L4-5 and L5-S1.  No spondylolisthesis or compression fractures.  Some calcification of the aorta is present.    PMFS History: Patient Active Problem List   Diagnosis Date Noted  . Arthritis of right knee   . 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 Vitamin D deficiency 03/10/2017  . Chronic pain of right knee 10/08/2016  . Chronic pain of left knee 10/08/2016  . Atypical chest pain 07/07/2016  . Prediabetes 12/20/2015  . Microscopic hematuria 01/26/2015  . Polypharmacy 01/26/2015  . Carpal tunnel syndrome of right wrist 01/26/2015  . Panic attack 01/26/2015  . Migraine 04/05/2012  . Hyperlipidemia   . GERD (gastroesophageal reflux disease)   . Osteopenia   . HTN (hypertension) 11/04/2011   Past Medical History:  Diagnosis Date  . Allergy   . Anxiety   . Arthritis    Bilateral knees  . Cancer ([place])    Basal cell carcinoma 2021  . Depression    Years ago back in 2000 when husband left and father died   . GERD (gastroesophageal reflux disease)   . Hyperlipidemia   . Hypertension   . Osteopenia 8-04   DEXA at [facility] 04/14/14 with lowest Tscore -1.7 at Left femoral neck. FRAX does not indicate need for bisphosphonate but density has sig worsened since prior dexa on 12/17/2009  . Pre-diabetes   . Prediabetes     Family History  Problem Relation Age of Onset  . Hypertension Father   . Heart disease Father   . Cancer Father        BLADDER & LUNG  . Heart disease Paternal Grandmother   . Alzheimer's disease Mother   . Heart  disease Brother   . Heart disease Paternal Grandfather   . Diabetes Paternal Grandfather   . Colon cancer Neg Hx   . Pancreatic cancer Neg Hx   . Stomach cancer Neg Hx     Past Surgical History:  Procedure Laterality Date  . APPENDECTOMY    . DILATION AND CURETTAGE OF UTERUS  1969   MISSED AB  . KNEE ARTHROSCOPY  2007  . 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 TOTAL KNEE ARTHROPLASTY Right 12/11/2020   Procedure: RIGHT TOTAL KNEE ARTHROPLASTY;  Surgeon: Meredith Pel, MD;  Location: [facility];  Service: Orthopedics;  Laterality: Right;  .  TUBAL LIGATION  1980  . tumor removal salivary gland     Social History   Occupational History  . Not on file  Tobacco Use  . Smoking status: Former Smoker    Quit date: 1975    Years since quitting: 47.3  . Smokeless tobacco: Never Used  Vaping Use  . Vaping Use: Never used  Substance and Sexual Activity  . Alcohol use: Yes    Comment: Rare- 2 glasses of wine a year  . Drug use: No    Frequency: 3.0 times per week  . Sexual activity: Never

## 2021-01-01 ENCOUNTER — Other Ambulatory Visit: Payer: Self-pay

## 2021-01-01 ENCOUNTER — Telehealth: Payer: Self-pay

## 2021-01-01 DIAGNOSIS — M79605 Pain in left leg: Secondary | ICD-10-CM

## 2021-01-01 NOTE — Telephone Encounter (Signed)
I called them yesterday and discussed it at length.  My recommendation was for MRI with contrast of lumbar in thoracic spine along with setting her up to get injection with Dr. Ernestina Patches.  I did call her yesterday and talk with her for about 15 minutes at the end of the day

## 2021-01-01 NOTE — Telephone Encounter (Signed)
Patient calling wanting to know if you can please call them about the MRI report from [facility] that was discussed at last OV.

## 2021-01-08 ENCOUNTER — Other Ambulatory Visit: Payer: Medicare PPO

## 2021-01-13 ENCOUNTER — Ambulatory Visit
Admission: RE | Admit: 2021-01-13 | Discharge: 2021-01-13 | Disposition: A | Discharge status: Home / Self Care | Payer: Medicare PPO | Source: Ambulatory Visit | Attending: Orthopedic Surgery | Admitting: Orthopedic Surgery

## 2021-01-13 DIAGNOSIS — M79605 Pain in left leg: Secondary | ICD-10-CM

## 2021-01-13 IMAGING — MR MR LUMBAR SPINE WO/W CM
4 of 7 series · 23 of 48 positions shown · IV contrast (13ml Multihance)
Comparison: None.

CLINICAL DATA: Pain in the left leg and left back since a total
knee replacement [DATE]

EXAM:
MRI THORACIC AND LUMBAR SPINE WITHOUT AND WITH CONTRAST
TECHNIQUE: Multiplanar and multiecho pulse sequences of the thoracic and lumbar
spine were obtained without and with intravenous contrast.
Contrast: 13 cc of MultiHance intravenous

[Series 2: T1 · sagittal · 4.0mm · 1.09mm/px · 3 of 15 slices shown (1 of 2)]
[im 1/15]
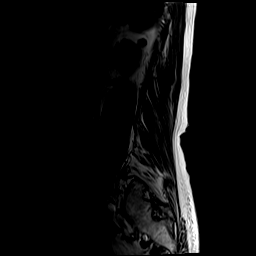
[im 8/15]
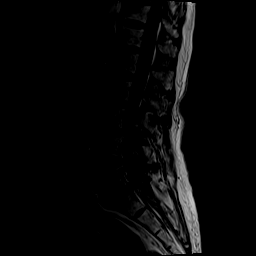
[im 15/15]
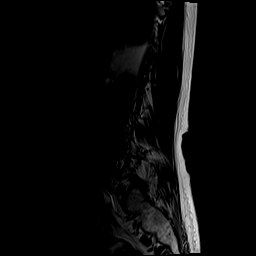

[Series 4: T2 · axial · 4.0mm · 0.39mm/px · z∈[-460,-258]mm · 11 of 41 slices shown (1 of 2)]
[im 1/41]
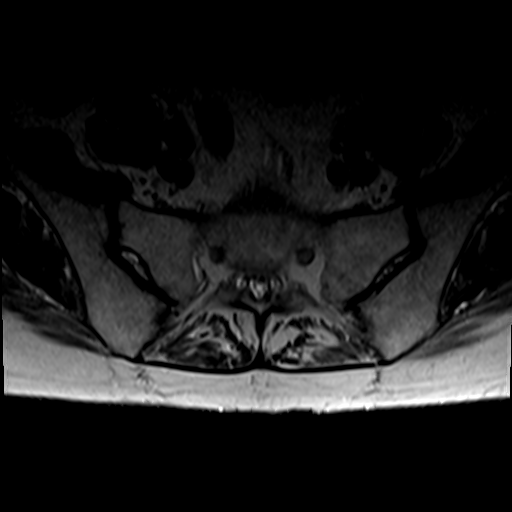
[im 5/41]
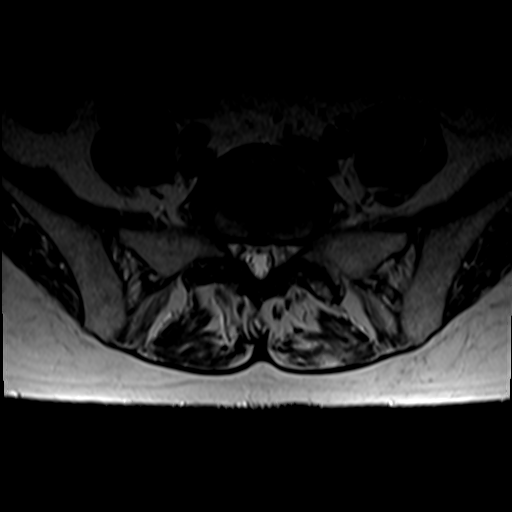
[im 9/41]
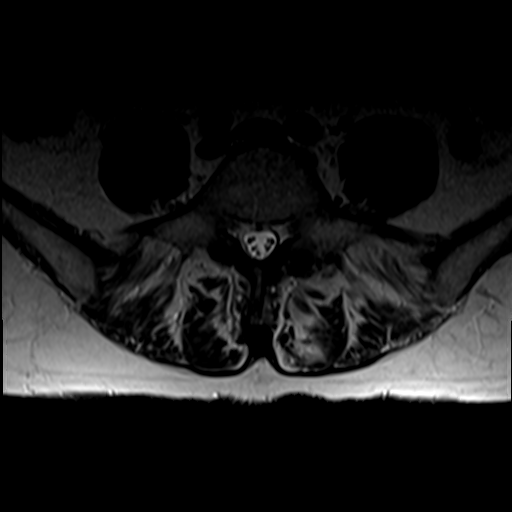
[im 13/41]
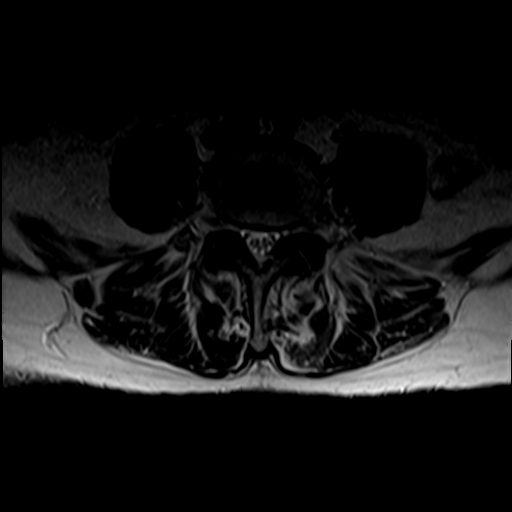
[im 17/41]
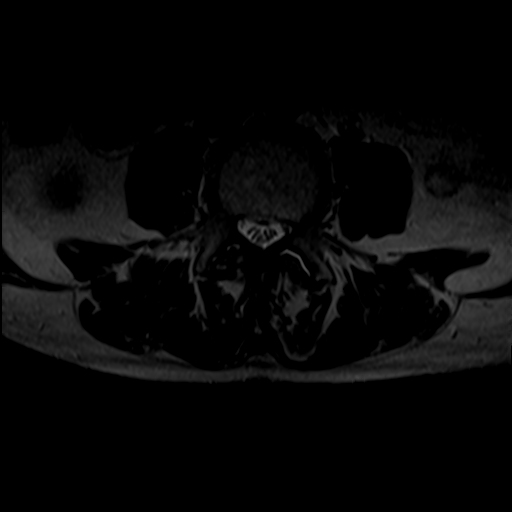
[im 21/41]
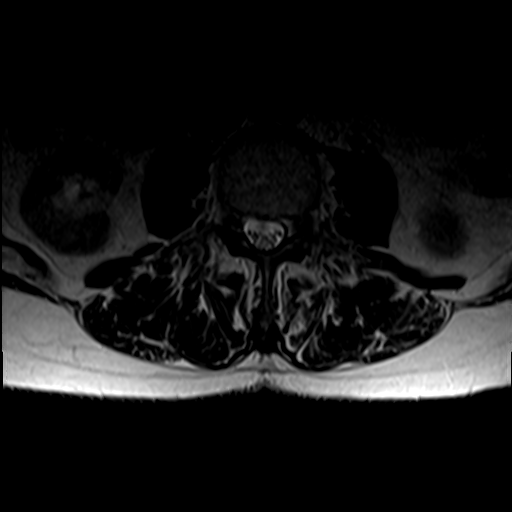
[im 25/41]
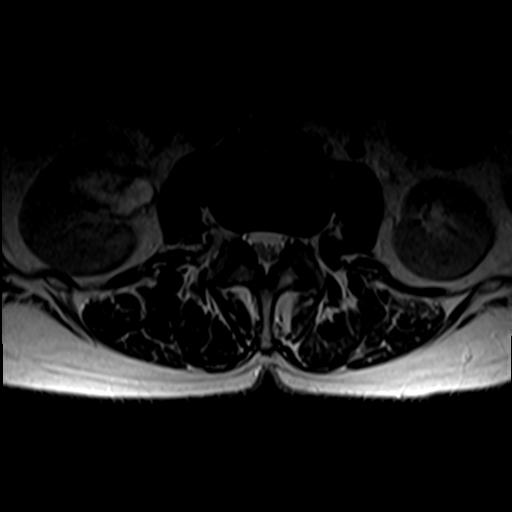
[im 29/41]
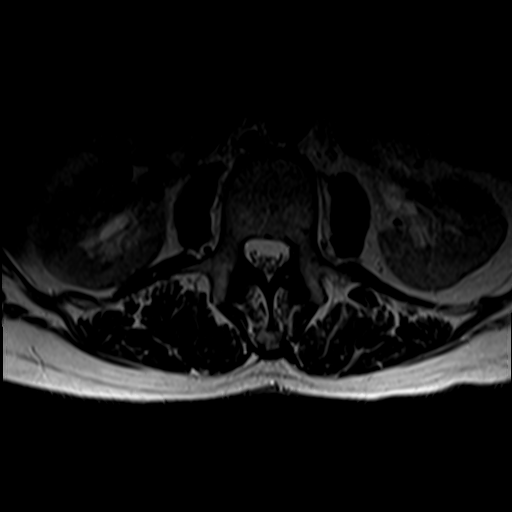
[im 33/41]
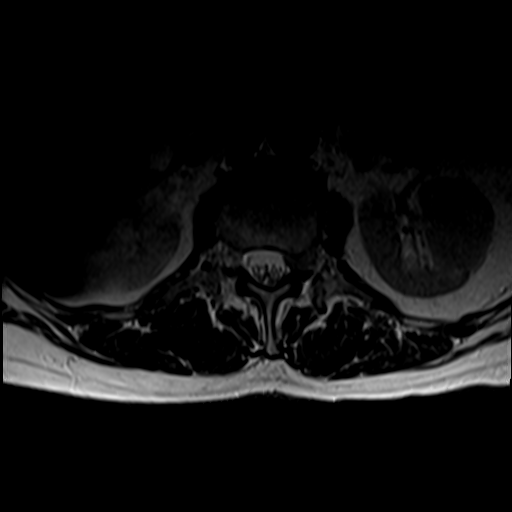
[im 37/41]
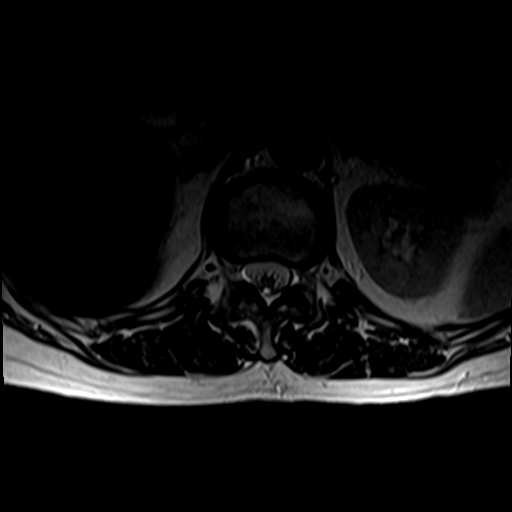
[im 41/41]
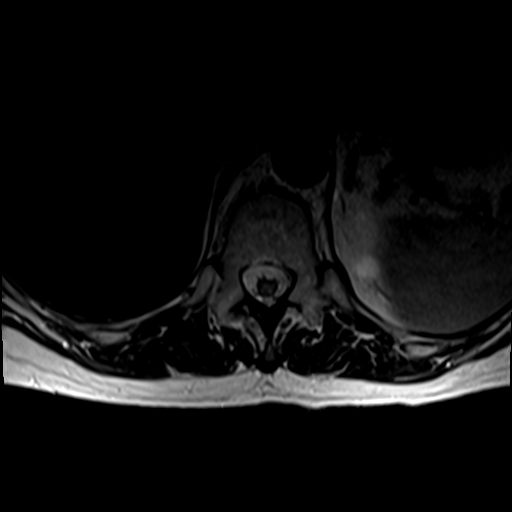

[Series 5: T1 · axial · 4.0mm · 0.39mm/px · z∈[-460,-277]mm · 5 of 41 slices shown (2 of 2)]
[im 1/41]
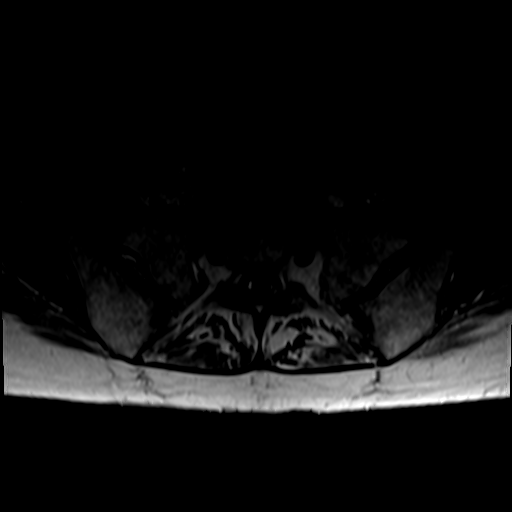
[im 5/41]
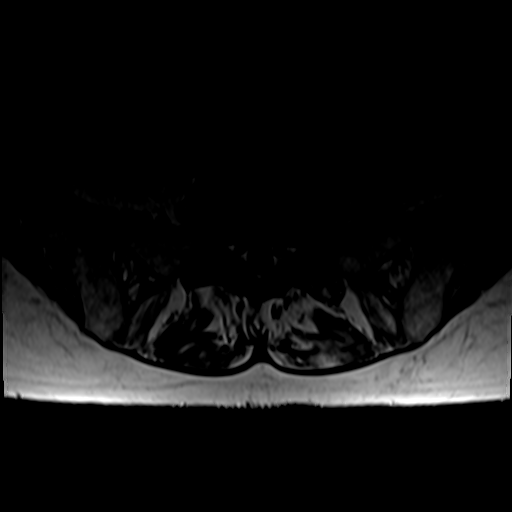
[im 9/41]
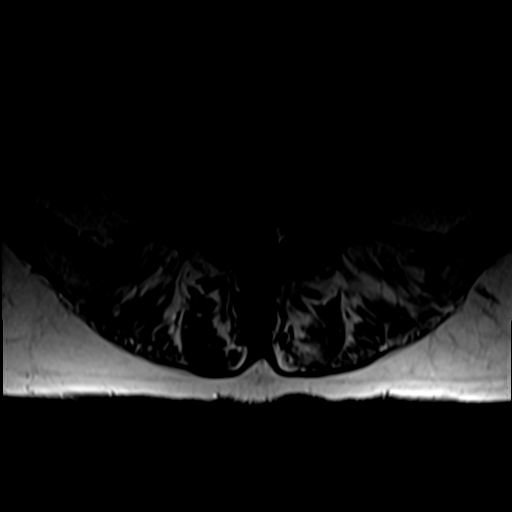
[im 21/41]
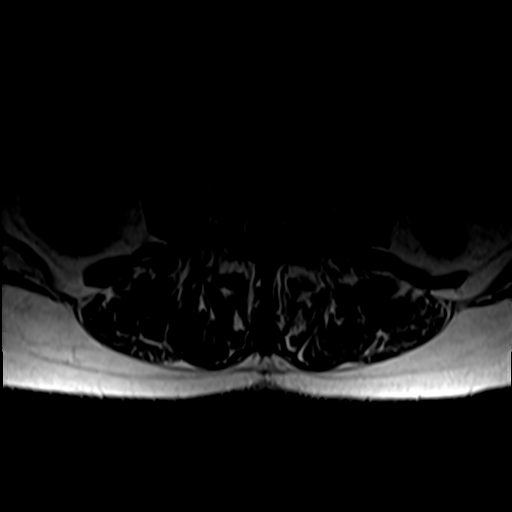
[im 37/41]
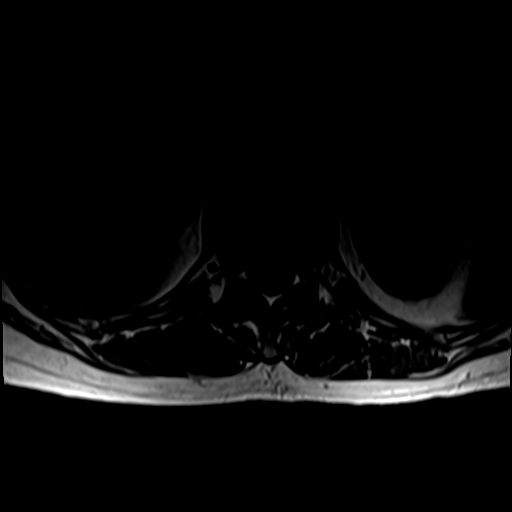

[Series 6: T2 · sagittal · 4.0mm · 1.09mm/px · 4 of 15 slices shown (2 of 2)]
[im 1/15]
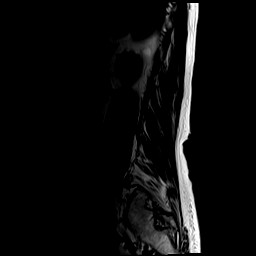
[im 5/15]
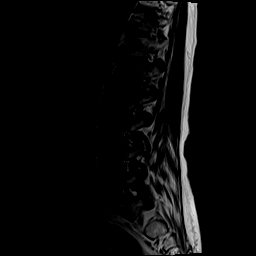
[im 10/15]
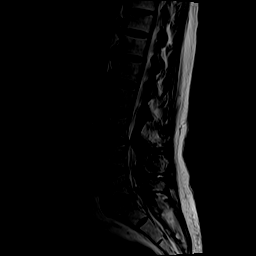
[im 15/15]
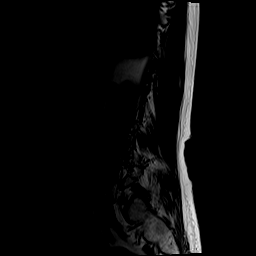

[23 of 48 positions shown; findings below may reference images not displayed]

FINDINGS: MRI THORACIC SPINE FINDINGS

Alignment:  Physiologic.

Vertebrae: Remote T11 superior endplate fracture. No acute fracture
or discitis.

Cord: Small focus of nonenhancing T2 hyperintensity in the cord at
T12, not covered on axial slices. No associated swelling, suspect
myelomalacia.

Paraspinal and other soft tissues: Negative.

Disc levels:

Generalized disc space narrowing and desiccation with small
noncompressive disc protrusions at nearly every level. Mild facet
spurring and lower thoracic ligamentum flavum thickening. The canal
and foramina appear diffusely patent.

MRI LUMBAR SPINE FINDINGS

Segmentation:  5 lumbar type vertebrae

Alignment:  Degenerative grade 1 anterolisthesis at L3-4 and L4-5.

Vertebrae: Minor discogenic endplate edema at T12-L1. Interspinous
bursa at L3-4 and L4-5, usually related to accentuated motion. No
evidence of fracture or bone lesion. No abnormal enhancement

Conus medullaris and cauda equina: Conus extends to the L1-2 level.
Normal appearance of the cauda equina

Paraspinal and other soft tissues: No perispinal mass or
inflammation seen.

Disc levels:

T12- L1: Disc narrowing and mild bulging.

L1-L2: Although not well depicted on sagittal images, on axial
slices there is a convincing left foraminal extrusion at the upper
foramen, impinging on the exiting L1 nerve root.

L2-L3: Minor disc bulging and ligamentum flavum thickening

L3-L4: Facet osteoarthritis with spurring and ligamentous
thickening. The disc is narrowed and bulging with left far-lateral
annular fissure. Advanced and compressive spinal stenosis. Left
foraminal impingement.

L4-L5: Disc narrowing and bulging. Facet spurring and ligamentum
flavum thickening with high-grade spinal stenosis. Bilateral L5
impingement in the subarticular recesses. Patent foramina

L5-S1:Disc narrowing and bulging. Degenerative facet spurring on
both sides. Right more than left subarticular recess narrowing which
could affect either S1 nerve root.
IMPRESSION: MR THORACIC SPINE IMPRESSION

1. No impingement or visible inflammation.
2. Small focus of presumed myelomalacia at the T12 level.

MR LUMBAR SPINE IMPRESSION

1. L1-2 left foraminal extrusion impinging on the L1 nerve root.
2. High-grade degenerative spinal stenosis at L4-5 and especially
L3-4.

## 2021-01-13 IMAGING — MR MR THORACIC SPINE WO/W CM
4 of 9 series · 19 of 48 positions shown · IV contrast (13ml Multihance)
Comparison: None.

CLINICAL DATA: Pain in the left leg and left back since a total
knee replacement [DATE]

EXAM:
MRI THORACIC AND LUMBAR SPINE WITHOUT AND WITH CONTRAST
TECHNIQUE: Multiplanar and multiecho pulse sequences of the thoracic and lumbar
spine were obtained without and with intravenous contrast.
Contrast: 13 cc of MultiHance intravenous

[Series 7: T1 · sagittal · 4.0mm · 0.48mm/px · 3 of 16 slices shown]
[im 1/16]
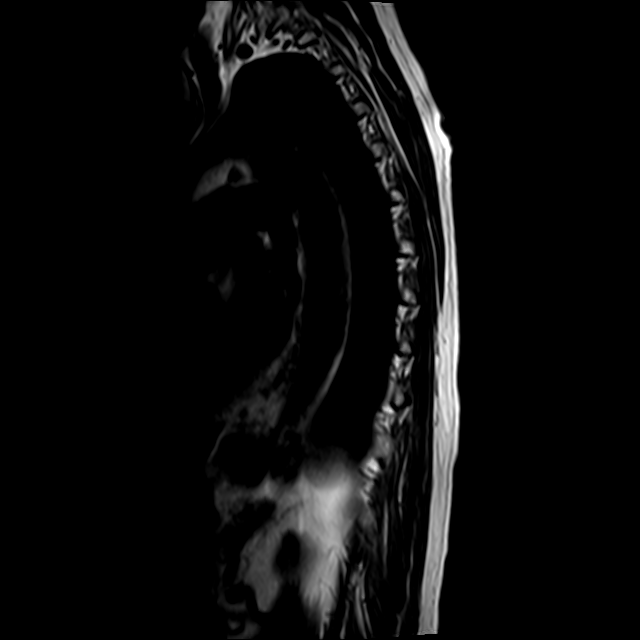
[im 8/16]
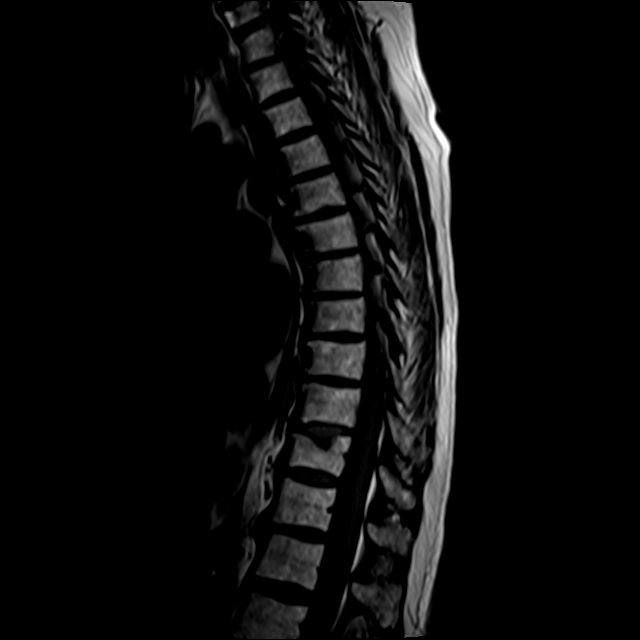
[im 16/16]
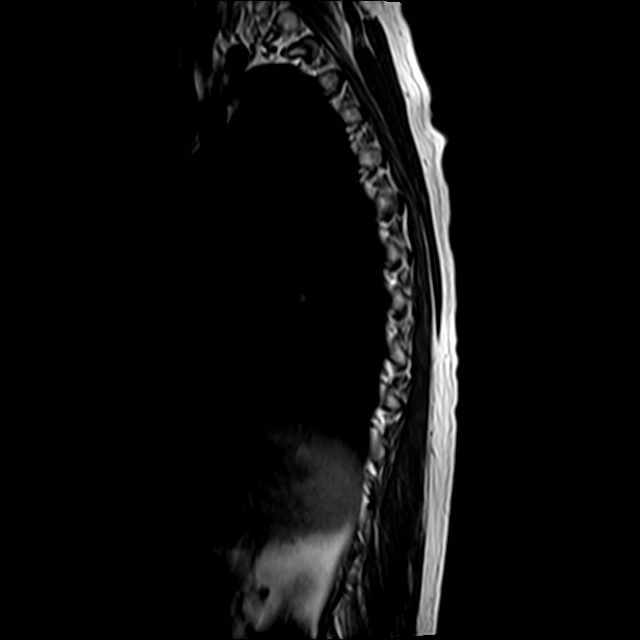

[Series 8: T2 · axial · 4.0mm · 0.39mm/px · z∈[-263,-43]mm · 8 of 39 slices shown (1 of 2)]
[im 1/39]
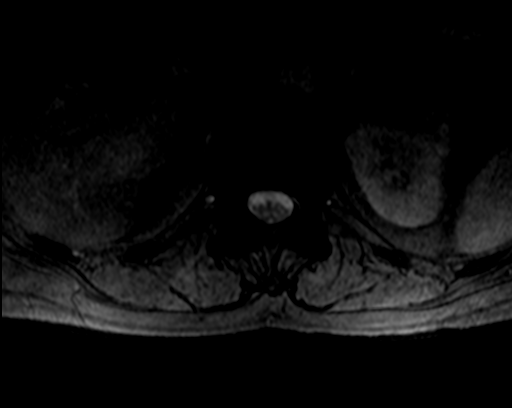
[im 6/39]
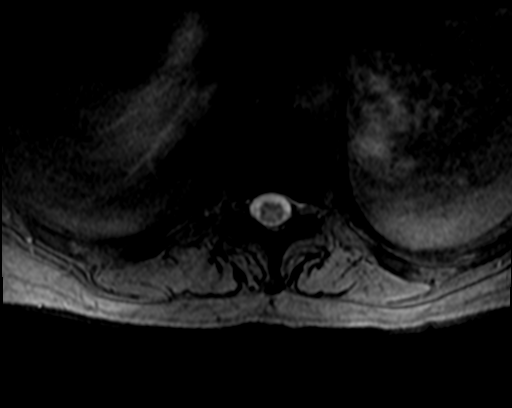
[im 11/39]
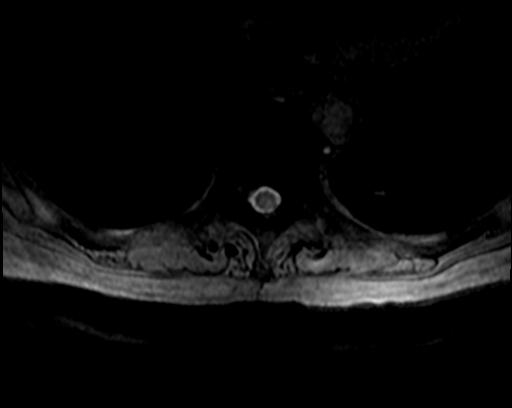
[im 17/39]
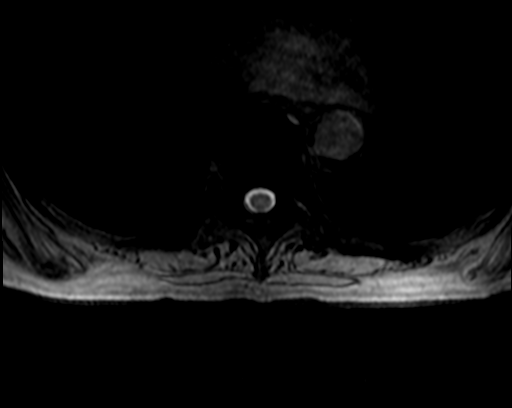
[im 22/39]
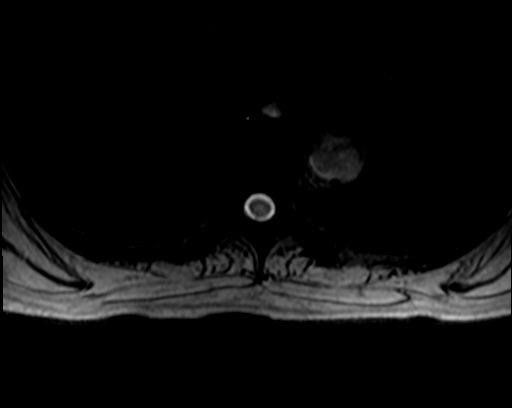
[im 28/39]
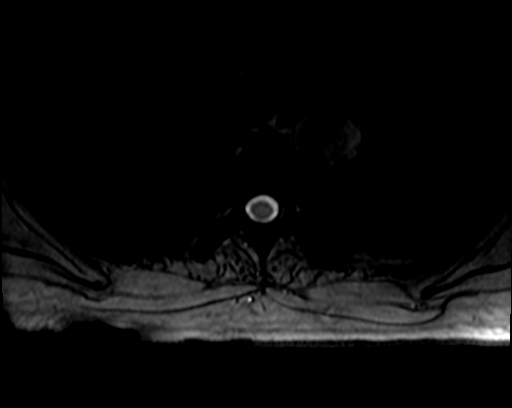
[im 33/39]
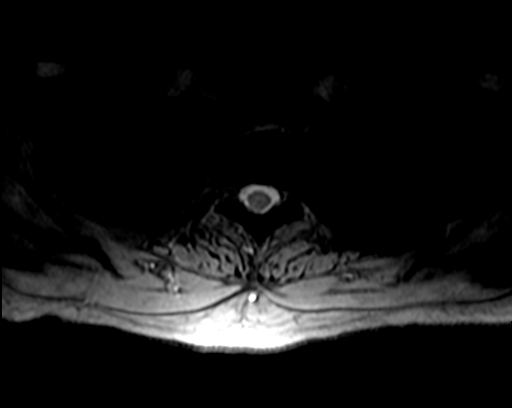
[im 39/39]
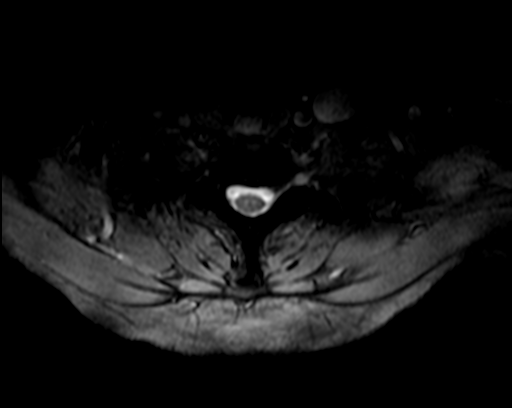

[Series 9: T2 · axial · 4.0mm · 0.39mm/px · z∈[-263,-79]mm · 5 of 39 slices shown (2 of 2)]
[im 1/39]
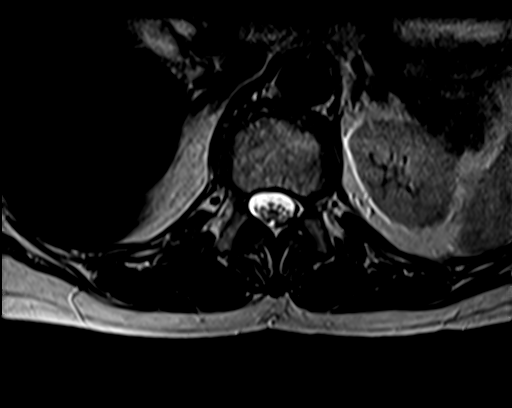
[im 6/39]
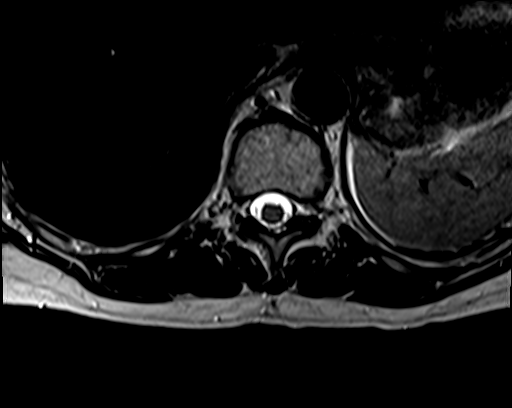
[im 11/39]
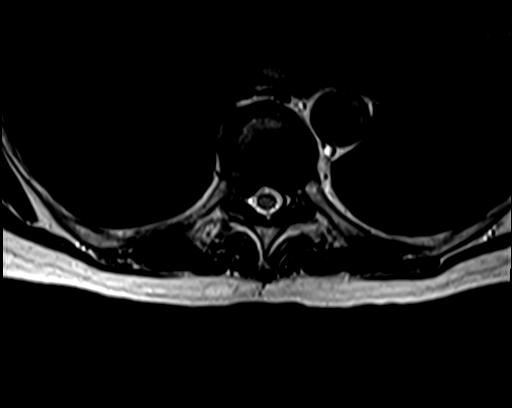
[im 22/39]
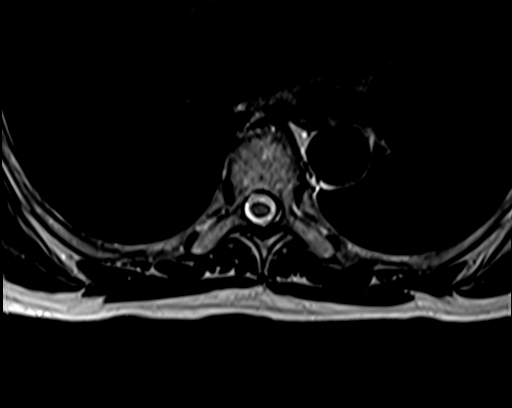
[im 33/39]
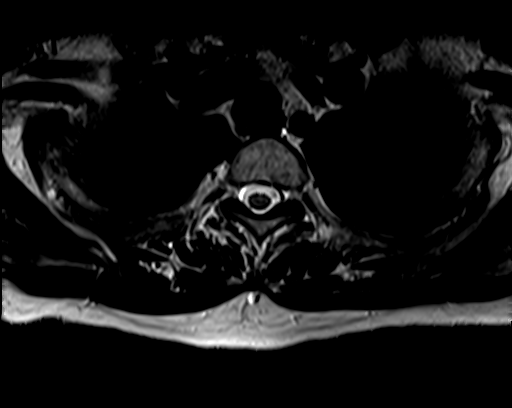

[Series 11: T2 post-contrast · sagittal · 4.0mm · 0.61mm/px · 3 of 16 slices shown]
[im 1/16]
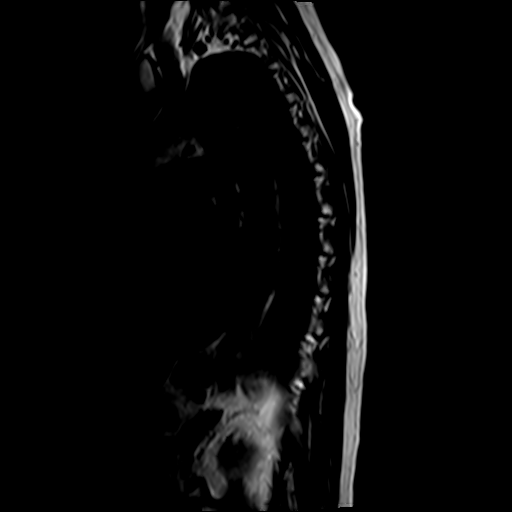
[im 8/16]
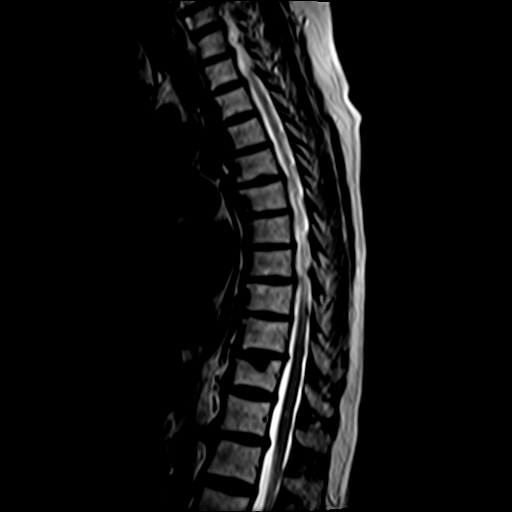
[im 16/16]
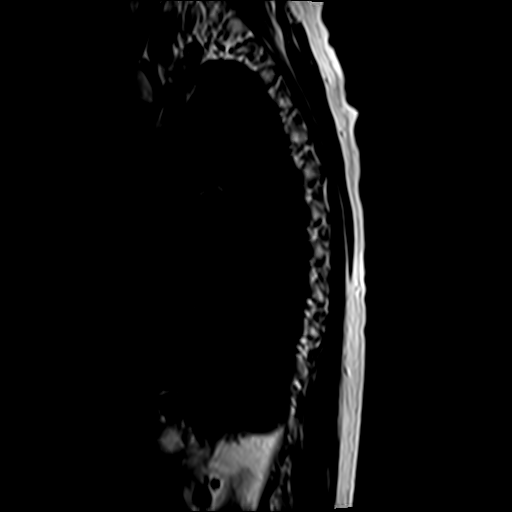

[19 of 48 positions shown; findings below may reference images not displayed]

FINDINGS: MRI THORACIC SPINE FINDINGS

Alignment:  Physiologic.

Vertebrae: Remote T11 superior endplate fracture. No acute fracture
or discitis.

Cord: Small focus of nonenhancing T2 hyperintensity in the cord at
T12, not covered on axial slices. No associated swelling, suspect
myelomalacia.

Paraspinal and other soft tissues: Negative.

Disc levels:

Generalized disc space narrowing and desiccation with small
noncompressive disc protrusions at nearly every level. Mild facet
spurring and lower thoracic ligamentum flavum thickening. The canal
and foramina appear diffusely patent.

MRI LUMBAR SPINE FINDINGS

Segmentation:  5 lumbar type vertebrae

Alignment:  Degenerative grade 1 anterolisthesis at L3-4 and L4-5.

Vertebrae: Minor discogenic endplate edema at T12-L1. Interspinous
bursa at L3-4 and L4-5, usually related to accentuated motion. No
evidence of fracture or bone lesion. No abnormal enhancement

Conus medullaris and cauda equina: Conus extends to the L1-2 level.
Normal appearance of the cauda equina

Paraspinal and other soft tissues: No perispinal mass or
inflammation seen.

Disc levels:

T12- L1: Disc narrowing and mild bulging.

L1-L2: Although not well depicted on sagittal images, on axial
slices there is a convincing left foraminal extrusion at the upper
foramen, impinging on the exiting L1 nerve root.

L2-L3: Minor disc bulging and ligamentum flavum thickening

L3-L4: Facet osteoarthritis with spurring and ligamentous
thickening. The disc is narrowed and bulging with left far-lateral
annular fissure. Advanced and compressive spinal stenosis. Left
foraminal impingement.

L4-L5: Disc narrowing and bulging. Facet spurring and ligamentum
flavum thickening with high-grade spinal stenosis. Bilateral L5
impingement in the subarticular recesses. Patent foramina

L5-S1:Disc narrowing and bulging. Degenerative facet spurring on
both sides. Right more than left subarticular recess narrowing which
could affect either S1 nerve root.
IMPRESSION: MR THORACIC SPINE IMPRESSION

1. No impingement or visible inflammation.
2. Small focus of presumed myelomalacia at the T12 level.

MR LUMBAR SPINE IMPRESSION

1. L1-2 left foraminal extrusion impinging on the L1 nerve root.
2. High-grade degenerative spinal stenosis at L4-5 and especially
L3-4.

## 2021-01-13 MED ORDER — GADOBENATE DIMEGLUMINE 529 MG/ML IV SOLN
13.0000 mL | Freq: Once | INTRAVENOUS | Status: AC | PRN
  Administered 2021-01-13: 13 mL via INTRAVENOUS

## 2021-01-16 ENCOUNTER — Ambulatory Visit (INDEPENDENT_AMBULATORY_CARE_PROVIDER_SITE_OTHER): Payer: Medicare PPO | Admitting: Orthopedic Surgery

## 2021-01-16 ENCOUNTER — Telehealth: Payer: Self-pay

## 2021-01-16 DIAGNOSIS — M79605 Pain in left leg: Secondary | ICD-10-CM

## 2021-01-16 NOTE — Telephone Encounter (Signed)
This has been cancelled

## 2021-01-16 NOTE — Telephone Encounter (Signed)
Patient seen in office today and wants to cancel ESI scheduled for [facility] on 06/03

## 2021-01-18 ENCOUNTER — Other Ambulatory Visit: Payer: Medicare PPO

## 2021-01-18 ENCOUNTER — Encounter: Payer: Self-pay | Admitting: Orthopedic Surgery

## 2021-01-18 NOTE — Progress Notes (Signed)
Post-Op Visit Note   Patient: Carol Tucker           Date of Birth: Jul 06, 1941           MRN: [id number] Visit Date: 01/16/2021 PCP: Pcp, No   Assessment & Plan:  Chief Complaint:  Chief Complaint  Patient presents with  . Other    Scan review   Visit Diagnoses:  1. Pain in left leg     Plan: [name] is a 80 year old patient who is now about 6 weeks out right total knee replacement.  She is in physical therapy doing stretching and strengthening.  Plans to watch a movie with her girlfriends tomorrow.  Going back to [place] this weekend.  In general she was having some back pain as well and has stenosis on MRI scans but that has improved significantly.  And reports are given to the patient today in case she needs to get epidural steroid injections in [place].  On examination she has range of motion 0-1 10 with good strength.  No nerve root tension signs.  No masses lymphadenopathy or skin changes noted around either knee.  No effusion is present.  Extensor mechanism intact bilaterally.  Plan at this time is cancel shot at [facility].  Continue with therapy and quad strengthening on her own in [place].  Overall she is doing well.  She may get her left knee replaced in October.  Follow-up at that time if she decides to pursue that.  She does have a daughter who lives here in town who is been looking after her.  Follow-Up Instructions: Return if symptoms worsen or fail to improve.   Orders:  No orders of the defined types were placed in this encounter.  No orders of the defined types were placed in this encounter.   Imaging: No results found.  PMFS History: Patient Active Problem List   Diagnosis Date Noted  . Arthritis of right knee   . 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 Vitamin D deficiency 03/10/2017  . Chronic pain of right knee 10/08/2016  . Chronic pain of left knee 10/08/2016  . Atypical chest pain 07/07/2016  . Prediabetes  12/20/2015  . Microscopic hematuria 01/26/2015  . Polypharmacy 01/26/2015  . Carpal tunnel syndrome of right wrist 01/26/2015  . Panic attack 01/26/2015  . Migraine 04/05/2012  . Hyperlipidemia   . GERD (gastroesophageal reflux disease)   . Osteopenia   . HTN (hypertension) 11/04/2011   Past Medical History:  Diagnosis Date  . Allergy   . Anxiety   . Arthritis    Bilateral knees  . Cancer ([place])    Basal cell carcinoma 2021  . Depression    Years ago back in 2000 when husband left and father died   . GERD (gastroesophageal reflux disease)   . Hyperlipidemia   . Hypertension   . Osteopenia 8-04   DEXA at [facility] 04/14/14 with lowest Tscore -1.7 at Left femoral neck. FRAX does not indicate need for bisphosphonate but density has sig worsened since prior dexa on 12/17/2009  . Pre-diabetes   . Prediabetes     Family History  Problem Relation Age of Onset  . Hypertension Father   . Heart disease Father   . Cancer Father        BLADDER & LUNG  . Heart disease Paternal Grandmother   . Alzheimer's disease Mother   . Heart disease Brother   . Heart disease Paternal Grandfather   . Diabetes Paternal  Grandfather   . Colon cancer Neg Hx   . Pancreatic cancer Neg Hx   . Stomach cancer Neg Hx     Past Surgical History:  Procedure Laterality Date  . APPENDECTOMY    . DILATION AND CURETTAGE OF UTERUS  1969   MISSED AB  . KNEE ARTHROSCOPY  2007  . 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 TOTAL KNEE ARTHROPLASTY Right 12/11/2020   Procedure: RIGHT TOTAL KNEE ARTHROPLASTY;  Surgeon: Meredith Pel, MD;  Location: [facility];  Service: Orthopedics;  Laterality: Right;  . TUBAL LIGATION  1980  . tumor removal salivary gland     Social History   Occupational History  . Not on file  Tobacco Use  . Smoking status: Former Smoker    Quit date: 1975    Years since quitting: 47.4  . Smokeless tobacco: Never Used  Vaping Use  . Vaping Use: Never used  Substance and Sexual Activity  .  Alcohol use: Yes    Comment: Rare- 2 glasses of wine a year  . Drug use: No    Frequency: 3.0 times per week  . Sexual activity: Never

## 2021-03-11 ENCOUNTER — Telehealth: Payer: Self-pay | Admitting: Orthopedic Surgery

## 2021-03-11 NOTE — Telephone Encounter (Signed)
Patient called to say she is ready to proceed with surgery on the LEFT KNEE now.  She had right total knee back in April.  She has asked that I hold 04-23-21 for her as she is trying to coordinate when her daughter will be able to help her with recovery.  Please provide surgery sheet.  Thanks

## 2021-03-14 NOTE — Telephone Encounter (Signed)
Done thx

## 2021-03-26 ENCOUNTER — Other Ambulatory Visit: Payer: Self-pay

## 2021-04-18 NOTE — Pre-Procedure Instructions (Addendum)
Surgical Instructions    Your procedure is scheduled on Tuesday, September 6th.  Report to [facility] Main Entrance "A" at 9:00 A.M., then check in with the Admitting office.  Call this number if you have problems the morning of surgery:  [phone]   If you have any questions prior to your surgery date call [phone]: Open Monday-Friday 8am-4pm    Remember:  Do not eat after midnight the night before your surgery  You may drink clear liquids until 8:00 a.m. the morning of your surgery.   Clear liquids allowed are: Water, Non-Citrus Juices (without pulp), Carbonated Beverages, Clear Tea, Black Coffee Only, and Gatorade.  Please complete your PRE-SURGERY 10oz bottle of water that was provided to you by 8:00 a.m. the morning of surgery.      Take these medicines the morning of surgery with A SIP OF WATER  pravastatin (PRAVACHOL)    Take these medications as needed: acetaminophen (TYLENOL) cyclobenzaprine (FLEXERIL) famotidine (PEPCID)  ketotifen (ZADITOR) oxyCODONE (ROXICODONE) Nasal spray  As of today, STOP taking any Aspirin (unless otherwise instructed by your surgeon) Aleve, Naproxen, Ibuprofen, Motrin, Advil, Goody's, BC's, all herbal medications, fish oil, and all vitamins.                     Do NOT Smoke (Tobacco/Vaping) or drink Alcohol 24 hours prior to your procedure.  If you use a CPAP at night, you may bring all equipment for your overnight stay.   Contacts, glasses, piercing's, hearing aid's, dentures or partials may not be worn into surgery, please bring cases for these belongings.    For patients admitted to the hospital, discharge time will be determined by your treatment team.   Patients discharged the day of surgery will not be allowed to drive home, and someone needs to stay with them for 24 hours.  ONLY 1 SUPPORT PERSON MAY BE PRESENT WHILE YOU ARE IN SURGERY. IF YOU ARE TO BE ADMITTED ONCE YOU ARE IN YOUR ROOM YOU WILL BE ALLOWED TWO (2) VISITORS.   Minor children may have two parents present. Special consideration for safety and communication needs will be reviewed on a case by case basis.   Special instructions:   - Preparing For Surgery  Before surgery, you can play an important role. Because skin is not sterile, your skin needs to be as free of germs as possible. You can reduce the number of germs on your skin by washing with CHG (chlorahexidine gluconate) Soap before surgery.  CHG is an antiseptic cleaner which kills germs and bonds with the skin to continue killing germs even after washing.    Oral Hygiene is also important to reduce your risk of infection.  Remember - BRUSH YOUR TEETH THE MORNING OF SURGERY WITH YOUR REGULAR TOOTHPASTE  Please do not use if you have an allergy to CHG or antibacterial soaps. If your skin becomes reddened/irritated stop using the CHG.  Do not shave (including legs and underarms) for at least 48 hours prior to first CHG shower. It is OK to shave your face.  Please follow these instructions carefully.   Shower the NIGHT BEFORE SURGERY and the MORNING OF SURGERY  If you chose to wash your hair, wash your hair first as usual with your normal shampoo.  After you shampoo, rinse your hair and body thoroughly to remove the shampoo.  Use CHG Soap as you would any other liquid soap. You can apply CHG directly to the skin and wash gently with  a scrungie or a clean washcloth.   Apply the CHG Soap to your body ONLY FROM THE NECK DOWN.  Do not use on open wounds or open sores. Avoid contact with your eyes, ears, mouth and genitals (private parts). Wash Face and genitals (private parts)  with your normal soap.   Wash thoroughly, paying special attention to the area where your surgery will be performed.  Thoroughly rinse your body with warm water from the neck down.  DO NOT shower/wash with your normal soap after using and rinsing off the CHG Soap.  Pat yourself dry with a CLEAN TOWEL.  Wear  CLEAN PAJAMAS to bed the night before surgery  Place CLEAN SHEETS on your bed the night before your surgery  DO NOT SLEEP WITH PETS.   Day of Surgery: Shower with CHG soap. Do not wear jewelry, make up, nail polish, gel polish, artificial nails, or any other type of covering on natural nails including finger and toenails. If patients have artificial nails, gel coating, etc. that need to be removed by a nail salon please have this removed prior to surgery. Surgery may need to be canceled/delayed if the surgeon/ anesthesia feels like the patient is unable to be adequately monitored. Do not wear lotions, powders, perfumes/colognes, or deodorant. Do not shave 48 hours prior to surgery.  Men may shave face and neck. Do not bring valuables to the hospital. [facility] is not responsible for any belongings or valuables. Wear Clean/Comfortable clothing the morning of surgery Remember to brush your teeth WITH YOUR REGULAR TOOTHPASTE.   Please read over the following fact sheets that you were given.

## 2021-04-19 ENCOUNTER — Encounter (HOSPITAL_COMMUNITY)
Admission: RE | Admit: 2021-04-19 | Discharge: 2021-04-19 | Disposition: A | Discharge status: Home / Self Care | Payer: Medicare PPO | Source: Ambulatory Visit | Attending: Orthopedic Surgery | Admitting: Orthopedic Surgery

## 2021-04-19 ENCOUNTER — Other Ambulatory Visit: Payer: Self-pay

## 2021-04-19 ENCOUNTER — Encounter (HOSPITAL_COMMUNITY): Payer: Self-pay

## 2021-04-19 DIAGNOSIS — Z01818 Encounter for other preprocedural examination: Secondary | ICD-10-CM | POA: Insufficient documentation

## 2021-04-19 DIAGNOSIS — Z20822 Contact with and (suspected) exposure to covid-19: Secondary | ICD-10-CM | POA: Diagnosis not present

## 2021-04-19 LAB — BASIC METABOLIC PANEL WITH GFR
Anion gap: 7 (ref 5–15)
BUN: 13 mg/dL (ref 8–23)
CO2: 26 mmol/L (ref 22–32)
Calcium: 9.7 mg/dL (ref 8.9–10.3)
Chloride: 103 mmol/L (ref 98–111)
Creatinine, Ser: 0.98 mg/dL (ref 0.44–1.00)
GFR, Estimated: 59 mL/min — ABNORMAL LOW
Glucose, Bld: 101 mg/dL — ABNORMAL HIGH (ref 70–99)
Potassium: 4.2 mmol/L (ref 3.5–5.1)
Sodium: 136 mmol/L (ref 135–145)

## 2021-04-19 LAB — HEMOGLOBIN A1C
Hgb A1c MFr Bld: 6.3 % — ABNORMAL HIGH (ref 4.8–5.6)
Mean Plasma Glucose: 134.11 mg/dL

## 2021-04-19 LAB — URINALYSIS, ROUTINE W REFLEX MICROSCOPIC
Bilirubin Urine: NEGATIVE
Glucose, UA: NEGATIVE mg/dL
Hgb urine dipstick: NEGATIVE
Ketones, ur: NEGATIVE mg/dL
Leukocytes,Ua: NEGATIVE
Nitrite: NEGATIVE
Protein, ur: NEGATIVE mg/dL
Specific Gravity, Urine: 1.004 — ABNORMAL LOW (ref 1.005–1.030)
pH: 6 (ref 5.0–8.0)

## 2021-04-19 LAB — CBC
HCT: 37.7 % (ref 36.0–46.0)
Hemoglobin: 12.5 g/dL (ref 12.0–15.0)
MCH: 30.9 pg (ref 26.0–34.0)
MCHC: 33.2 g/dL (ref 30.0–36.0)
MCV: 93.1 fL (ref 80.0–100.0)
Platelets: 284 10*3/uL (ref 150–400)
RBC: 4.05 MIL/uL (ref 3.87–5.11)
RDW: 13.6 % (ref 11.5–15.5)
WBC: 8.5 10*3/uL (ref 4.0–10.5)
nRBC: 0 % (ref 0.0–0.2)

## 2021-04-19 LAB — SURGICAL PCR SCREEN
MRSA, PCR: NEGATIVE
Staphylococcus aureus: NEGATIVE

## 2021-04-19 LAB — SARS CORONAVIRUS 2 (TAT 6-24 HRS): SARS Coronavirus 2: NEGATIVE

## 2021-04-19 MED ORDER — TRANEXAMIC ACID 1000 MG/10ML IV SOLN
2000.0000 mg | INTRAVENOUS | Status: DC
  Filled 2021-04-19: qty 20

## 2021-04-19 NOTE — Progress Notes (Signed)
PCP - Dr. Acquanetta Sit at [place] - Dr. Mertie Moores  PPM/ICD - N/A  Chest x-ray - N/A EKG - 12/07/20 Stress Test - No ECHO - No Cardiac Cath - No  Sleep Study - N/A  Patient states she is pre-diabetic; does not check her blood sugars.  Blood Thinner Instructions: N/A Aspirin Instructions: N/A  ERAS Protcol - Yes, water  COVID TEST- 04/19/21; Done in PAT   Anesthesia review: Yes, cardiac hx; abnormal EKG on 12/07/20  Patient denies shortness of breath, fever, cough and chest pain at PAT appointment   All instructions explained to the patient, with a verbal understanding of the material. Patient agrees to go over the instructions while at home for a better understanding. Patient also instructed to self quarantine after being tested for COVID-19. The opportunity to ask questions was provided.

## 2021-04-20 LAB — URINE CULTURE

## 2021-04-23 ENCOUNTER — Encounter (HOSPITAL_COMMUNITY): Payer: Self-pay | Admitting: Orthopedic Surgery

## 2021-04-23 ENCOUNTER — Encounter (HOSPITAL_COMMUNITY)
Admission: RE | Disposition: A | Discharge status: Home / Self Care | Payer: Self-pay | Source: Home / Self Care | Attending: Orthopedic Surgery

## 2021-04-23 ENCOUNTER — Ambulatory Visit (HOSPITAL_COMMUNITY): Payer: Medicare PPO | Admitting: Vascular Surgery

## 2021-04-23 ENCOUNTER — Ambulatory Visit (HOSPITAL_COMMUNITY): Payer: Medicare PPO | Admitting: Anesthesiology

## 2021-04-23 ENCOUNTER — Observation Stay (HOSPITAL_COMMUNITY)
Admission: RE | Admit: 2021-04-23 | Discharge: 2021-04-24 | Disposition: A | Discharge status: Home / Self Care | Payer: Medicare PPO | Attending: Orthopedic Surgery | Admitting: Orthopedic Surgery

## 2021-04-23 ENCOUNTER — Other Ambulatory Visit: Payer: Self-pay

## 2021-04-23 DIAGNOSIS — Z96651 Presence of right artificial knee joint: Secondary | ICD-10-CM | POA: Diagnosis not present

## 2021-04-23 DIAGNOSIS — R7303 Prediabetes: Secondary | ICD-10-CM | POA: Insufficient documentation

## 2021-04-23 DIAGNOSIS — Z85828 Personal history of other malignant neoplasm of skin: Secondary | ICD-10-CM | POA: Insufficient documentation

## 2021-04-23 DIAGNOSIS — M1712 Unilateral primary osteoarthritis, left knee: Principal | ICD-10-CM | POA: Insufficient documentation

## 2021-04-23 DIAGNOSIS — Z96652 Presence of left artificial knee joint: Secondary | ICD-10-CM

## 2021-04-23 HISTORY — PX: TOTAL KNEE ARTHROPLASTY: SHX125

## 2021-04-23 LAB — GLUCOSE, CAPILLARY
Glucose-Capillary: 102 mg/dL — ABNORMAL HIGH (ref 70–99)
Glucose-Capillary: 84 mg/dL (ref 70–99)

## 2021-04-23 SURGERY — ARTHROPLASTY, KNEE, TOTAL
Anesthesia: Regional | Site: Knee | Laterality: Left

## 2021-04-23 MED ORDER — MORPHINE SULFATE (PF) 4 MG/ML IV SOLN
INTRAVENOUS | Status: AC
  Filled 2021-04-23: qty 2

## 2021-04-23 MED ORDER — ONDANSETRON HCL 4 MG/2ML IJ SOLN: 4.0000 mg | Freq: Once | INTRAMUSCULAR | Status: DC | PRN

## 2021-04-23 MED ORDER — PROPOFOL 10 MG/ML IV BOLUS
INTRAVENOUS | Status: AC
  Filled 2021-04-23: qty 20

## 2021-04-23 MED ORDER — FENTANYL CITRATE PF 50 MCG/ML IJ SOSY: 50.0000 ug | PREFILLED_SYRINGE | Freq: Once | INTRAMUSCULAR | Status: DC

## 2021-04-23 MED ORDER — BUPIVACAINE LIPOSOME 1.3 % IJ SUSP
INTRAMUSCULAR | Status: DC | PRN
  Administered 2021-04-23: 20 mL

## 2021-04-23 MED ORDER — BUPIVACAINE LIPOSOME 1.3 % IJ SUSP
INTRAMUSCULAR | Status: AC
  Filled 2021-04-23: qty 20

## 2021-04-23 MED ORDER — MELOXICAM 7.5 MG PO TABS
7.5000 mg | ORAL_TABLET | Freq: Every day | ORAL | Status: DC
  Administered 2021-04-23 – 2021-04-24 (×2): 7.5 mg via ORAL
  Filled 2021-04-23 (×2): qty 1

## 2021-04-23 MED ORDER — FENTANYL CITRATE (PF) 100 MCG/2ML IJ SOLN
INTRAMUSCULAR | Status: AC
  Administered 2021-04-23: 50 ug
  Filled 2021-04-23: qty 2

## 2021-04-23 MED ORDER — DEXAMETHASONE SODIUM PHOSPHATE 10 MG/ML IJ SOLN
INTRAMUSCULAR | Status: AC
  Filled 2021-04-23: qty 1

## 2021-04-23 MED ORDER — ASPIRIN 81 MG PO CHEW
81.0000 mg | CHEWABLE_TABLET | Freq: Two times a day (BID) | ORAL | Status: DC
  Administered 2021-04-23 – 2021-04-24 (×2): 81 mg via ORAL
  Filled 2021-04-23 (×2): qty 1

## 2021-04-23 MED ORDER — ONDANSETRON HCL 4 MG PO TABS: 4.0000 mg | ORAL_TABLET | Freq: Four times a day (QID) | ORAL | Status: DC | PRN

## 2021-04-23 MED ORDER — METOCLOPRAMIDE HCL 5 MG/ML IJ SOLN: 5.0000 mg | Freq: Three times a day (TID) | INTRAMUSCULAR | Status: DC | PRN

## 2021-04-23 MED ORDER — METOCLOPRAMIDE HCL 5 MG PO TABS: 5.0000 mg | ORAL_TABLET | Freq: Three times a day (TID) | ORAL | Status: DC | PRN

## 2021-04-23 MED ORDER — FENTANYL CITRATE (PF) 100 MCG/2ML IJ SOLN
25.0000 ug | INTRAMUSCULAR | Status: DC | PRN
  Administered 2021-04-23: 25 ug via INTRAVENOUS

## 2021-04-23 MED ORDER — TRAMADOL HCL 50 MG PO TABS
50.0000 mg | ORAL_TABLET | Freq: Four times a day (QID) | ORAL | Status: DC
  Administered 2021-04-23: 100 mg via ORAL
  Administered 2021-04-24 (×3): 50 mg via ORAL
  Filled 2021-04-23: qty 1
  Filled 2021-04-23: qty 2
  Filled 2021-04-23 (×2): qty 1

## 2021-04-23 MED ORDER — TRANEXAMIC ACID-NACL 1000-0.7 MG/100ML-% IV SOLN
1000.0000 mg | INTRAVENOUS | Status: AC
  Administered 2021-04-23: 1000 mg via INTRAVENOUS
  Filled 2021-04-23: qty 100

## 2021-04-23 MED ORDER — LISINOPRIL 20 MG PO TABS
30.0000 mg | ORAL_TABLET | Freq: Every day | ORAL | Status: DC
  Administered 2021-04-23 – 2021-04-24 (×2): 30 mg via ORAL
  Filled 2021-04-23 (×2): qty 1

## 2021-04-23 MED ORDER — DEXAMETHASONE SODIUM PHOSPHATE 10 MG/ML IJ SOLN
INTRAMUSCULAR | Status: DC | PRN
  Administered 2021-04-23: 5 mg via INTRAVENOUS

## 2021-04-23 MED ORDER — DOCUSATE SODIUM 100 MG PO CAPS
100.0000 mg | ORAL_CAPSULE | Freq: Two times a day (BID) | ORAL | Status: DC
  Administered 2021-04-23 – 2021-04-24 (×2): 100 mg via ORAL
  Filled 2021-04-23 (×2): qty 1

## 2021-04-23 MED ORDER — LACTATED RINGERS IV SOLN: INTRAVENOUS | Status: DC

## 2021-04-23 MED ORDER — MIDAZOLAM HCL 2 MG/2ML IJ SOLN
INTRAMUSCULAR | Status: AC
  Filled 2021-04-23: qty 2

## 2021-04-23 MED ORDER — TRANEXAMIC ACID-NACL 1000-0.7 MG/100ML-% IV SOLN
INTRAVENOUS | Status: AC
  Filled 2021-04-23: qty 100

## 2021-04-23 MED ORDER — PROPOFOL 10 MG/ML IV BOLUS
INTRAVENOUS | Status: DC | PRN
  Administered 2021-04-23: 20 mg via INTRAVENOUS
  Administered 2021-04-23: 30 mg via INTRAVENOUS

## 2021-04-23 MED ORDER — VANCOMYCIN HCL 1000 MG IV SOLR
INTRAVENOUS | Status: DC | PRN
  Administered 2021-04-23: 1000 mg

## 2021-04-23 MED ORDER — CEFAZOLIN SODIUM-DEXTROSE 2-4 GM/100ML-% IV SOLN
2.0000 g | INTRAVENOUS | Status: AC
  Administered 2021-04-23: 2 g via INTRAVENOUS
  Filled 2021-04-23: qty 100

## 2021-04-23 MED ORDER — OXYCODONE HCL 5 MG PO TABS: 5.0000 mg | ORAL_TABLET | ORAL | Status: DC | PRN

## 2021-04-23 MED ORDER — ONDANSETRON HCL 4 MG/2ML IJ SOLN
INTRAMUSCULAR | Status: DC | PRN
  Administered 2021-04-23: 4 mg via INTRAVENOUS

## 2021-04-23 MED ORDER — MORPHINE SULFATE (PF) 4 MG/ML IV SOLN
INTRAVENOUS | Status: DC | PRN
  Administered 2021-04-23: 8 mg via INTRAMUSCULAR

## 2021-04-23 MED ORDER — DROPERIDOL 2.5 MG/ML IJ SOLN: 0.6250 mg | Freq: Once | INTRAMUSCULAR | Status: DC | PRN

## 2021-04-23 MED ORDER — TRANEXAMIC ACID 1000 MG/10ML IV SOLN
INTRAVENOUS | Status: DC | PRN
  Administered 2021-04-23: 2000 mg via TOPICAL

## 2021-04-23 MED ORDER — SODIUM CHLORIDE 0.9 % IR SOLN
Status: DC | PRN
  Administered 2021-04-23: 3000 mL

## 2021-04-23 MED ORDER — ACETAMINOPHEN 500 MG PO TABS
500.0000 mg | ORAL_TABLET | Freq: Four times a day (QID) | ORAL | Status: AC
  Administered 2021-04-23 – 2021-04-24 (×4): 500 mg via ORAL
  Filled 2021-04-23 (×4): qty 1

## 2021-04-23 MED ORDER — POVIDONE-IODINE 10 % EX SWAB: 2.0000 "application " | Freq: Once | CUTANEOUS | Status: DC

## 2021-04-23 MED ORDER — EPHEDRINE SULFATE-NACL 50-0.9 MG/10ML-% IV SOSY
PREFILLED_SYRINGE | INTRAVENOUS | Status: DC | PRN
  Administered 2021-04-23 (×5): 5 mg via INTRAVENOUS

## 2021-04-23 MED ORDER — POVIDONE-IODINE 10 % EX SWAB
2.0000 "application " | Freq: Once | CUTANEOUS | Status: AC
  Administered 2021-04-23: 2 via TOPICAL

## 2021-04-23 MED ORDER — ALBUMIN HUMAN 5 % IV SOLN: INTRAVENOUS | Status: DC | PRN

## 2021-04-23 MED ORDER — ONDANSETRON HCL 4 MG/2ML IJ SOLN: 4.0000 mg | Freq: Four times a day (QID) | INTRAMUSCULAR | Status: DC | PRN

## 2021-04-23 MED ORDER — PROPOFOL 1000 MG/100ML IV EMUL
INTRAVENOUS | Status: AC
  Filled 2021-04-23: qty 100

## 2021-04-23 MED ORDER — PROPOFOL 500 MG/50ML IV EMUL
INTRAVENOUS | Status: DC | PRN
  Administered 2021-04-23: 25 ug/kg/min via INTRAVENOUS

## 2021-04-23 MED ORDER — BUPIVACAINE HCL 0.25 % IJ SOLN
INTRAMUSCULAR | Status: DC | PRN
  Administered 2021-04-23: 20 mL
  Administered 2021-04-23: 10 mL

## 2021-04-23 MED ORDER — ONDANSETRON HCL 4 MG/2ML IJ SOLN
INTRAMUSCULAR | Status: AC
  Filled 2021-04-23: qty 2

## 2021-04-23 MED ORDER — CEFAZOLIN SODIUM-DEXTROSE 1-4 GM/50ML-% IV SOLN
1.0000 g | Freq: Three times a day (TID) | INTRAVENOUS | Status: AC
  Administered 2021-04-23 (×2): 1 g via INTRAVENOUS
  Filled 2021-04-23 (×2): qty 50

## 2021-04-23 MED ORDER — FENTANYL CITRATE (PF) 100 MCG/2ML IJ SOLN
INTRAMUSCULAR | Status: AC
  Filled 2021-04-23: qty 2

## 2021-04-23 MED ORDER — CHLORHEXIDINE GLUCONATE 0.12 % MT SOLN
15.0000 mL | Freq: Once | OROMUCOSAL | Status: AC
  Administered 2021-04-23: 15 mL via OROMUCOSAL
  Filled 2021-04-23: qty 15

## 2021-04-23 MED ORDER — PHENYLEPHRINE 40 MCG/ML (10ML) SYRINGE FOR IV PUSH (FOR BLOOD PRESSURE SUPPORT)
PREFILLED_SYRINGE | INTRAVENOUS | Status: DC | PRN
  Administered 2021-04-23 (×3): 80 ug via INTRAVENOUS

## 2021-04-23 MED ORDER — PHENYLEPHRINE 40 MCG/ML (10ML) SYRINGE FOR IV PUSH (FOR BLOOD PRESSURE SUPPORT)
PREFILLED_SYRINGE | INTRAVENOUS | Status: AC
  Filled 2021-04-23: qty 10

## 2021-04-23 MED ORDER — 0.9 % SODIUM CHLORIDE (POUR BTL) OPTIME
TOPICAL | Status: DC | PRN
  Administered 2021-04-23: 1000 mL
  Administered 2021-04-23: 3000 mL

## 2021-04-23 MED ORDER — BUPIVACAINE HCL (PF) 0.5 % IJ SOLN
INTRAMUSCULAR | Status: DC | PRN
  Administered 2021-04-23: 20 mL via PERINEURAL

## 2021-04-23 MED ORDER — VANCOMYCIN HCL 1000 MG IV SOLR
INTRAVENOUS | Status: AC
  Filled 2021-04-23: qty 20

## 2021-04-23 MED ORDER — SODIUM CHLORIDE 0.9% FLUSH
INTRAVENOUS | Status: DC | PRN
  Administered 2021-04-23: 20 mL via INTRAVENOUS

## 2021-04-23 MED ORDER — ORAL CARE MOUTH RINSE: 15.0000 mL | Freq: Once | OROMUCOSAL | Status: AC

## 2021-04-23 MED ORDER — IRRISEPT - 450ML BOTTLE WITH 0.05% CHG IN STERILE WATER, USP 99.95% OPTIME
TOPICAL | Status: DC | PRN
  Administered 2021-04-23: 450 mL via TOPICAL

## 2021-04-23 MED ORDER — GABAPENTIN 300 MG PO CAPS
300.0000 mg | ORAL_CAPSULE | Freq: Three times a day (TID) | ORAL | Status: DC
  Administered 2021-04-23 – 2021-04-24 (×3): 300 mg via ORAL
  Filled 2021-04-23 (×3): qty 1

## 2021-04-23 MED ORDER — ACETAMINOPHEN 325 MG PO TABS: 325.0000 mg | ORAL_TABLET | Freq: Four times a day (QID) | ORAL | Status: DC | PRN

## 2021-04-23 MED ORDER — FAMOTIDINE 20 MG PO TABS: 20.0000 mg | ORAL_TABLET | Freq: Two times a day (BID) | ORAL | Status: DC | PRN

## 2021-04-23 MED ORDER — MORPHINE SULFATE (PF) 2 MG/ML IV SOLN: 0.5000 mg | INTRAVENOUS | Status: DC | PRN

## 2021-04-23 MED ORDER — POVIDONE-IODINE 7.5 % EX SOLN
Freq: Once | CUTANEOUS | Status: DC
  Filled 2021-04-23: qty 118

## 2021-04-23 MED ORDER — PHENOL 1.4 % MT LIQD: 1.0000 | OROMUCOSAL | Status: DC | PRN

## 2021-04-23 MED ORDER — BUPIVACAINE HCL (PF) 0.25 % IJ SOLN
INTRAMUSCULAR | Status: AC
  Filled 2021-04-23: qty 30

## 2021-04-23 MED ORDER — ACETAMINOPHEN 500 MG PO TABS
1000.0000 mg | ORAL_TABLET | Freq: Once | ORAL | Status: AC
  Administered 2021-04-23: 1000 mg via ORAL
  Filled 2021-04-23: qty 2

## 2021-04-23 MED ORDER — MENTHOL 3 MG MT LOZG: 1.0000 | LOZENGE | OROMUCOSAL | Status: DC | PRN

## 2021-04-23 MED ORDER — CYCLOBENZAPRINE HCL 5 MG PO TABS: 5.0000 mg | ORAL_TABLET | Freq: Three times a day (TID) | ORAL | Status: DC | PRN

## 2021-04-23 MED ORDER — CLONIDINE HCL (ANALGESIA) 100 MCG/ML EP SOLN
EPIDURAL | Status: DC | PRN
  Administered 2021-04-23: 1 mL

## 2021-04-23 SURGICAL SUPPLY — 85 items
BAG COUNTER SPONGE SURGICOUNT (BAG) ×2 IMPLANT
BAG DECANTER FOR FLEXI CONT (MISCELLANEOUS) ×2 IMPLANT
BAG SPNG CNTER NS LX DISP (BAG) ×1
BANDAGE ESMARK 6X9 LF (GAUZE/BANDAGES/DRESSINGS) ×1 IMPLANT
BASEPLATE TIBIAL TRIATHALON 3 (Plate) ×1 IMPLANT
BLADE SAG 18X100X1.27 (BLADE) ×1 IMPLANT
BLADE SAGITTAL (BLADE) ×2
BLADE SAW SGTL MED 73X18.5 STR (BLADE) ×1 IMPLANT
BLADE SAW THK.89X75X18XSGTL (BLADE) ×1 IMPLANT
BNDG CMPR 9X6 STRL LF SNTH (GAUZE/BANDAGES/DRESSINGS) ×1
BNDG CMPR MED 10X6 ELC LF (GAUZE/BANDAGES/DRESSINGS) ×1
BNDG CMPR MED 15X6 ELC VLCR LF (GAUZE/BANDAGES/DRESSINGS) ×1
BNDG COHESIVE 6X5 TAN STRL LF (GAUZE/BANDAGES/DRESSINGS) ×2 IMPLANT
BNDG ELASTIC 4X5.8 VLCR STR LF (GAUZE/BANDAGES/DRESSINGS) ×1 IMPLANT
BNDG ELASTIC 6X10 VLCR STRL LF (GAUZE/BANDAGES/DRESSINGS) ×1 IMPLANT
BNDG ELASTIC 6X15 VLCR STRL LF (GAUZE/BANDAGES/DRESSINGS) ×2 IMPLANT
BNDG ESMARK 6X9 LF (GAUZE/BANDAGES/DRESSINGS) ×2
BOWL SMART MIX CTS (DISPOSABLE) ×1 IMPLANT
BSPLAT TIB 3 CMNT PRM STRL KN (Plate) ×1 IMPLANT
CEMENT BONE SIMPLEX SPEEDSET (Cement) ×2 IMPLANT
CNTNR URN SCR LID CUP LEK RST (MISCELLANEOUS) ×1 IMPLANT
CONT SPEC 4OZ STRL OR WHT (MISCELLANEOUS) ×2
COOLER ICEMAN CLASSIC (MISCELLANEOUS) ×1 IMPLANT
COVER SURGICAL LIGHT HANDLE (MISCELLANEOUS) ×2 IMPLANT
CUFF TOURN SGL QUICK 34 (TOURNIQUET CUFF) ×2
CUFF TOURN SGL QUICK 42 (TOURNIQUET CUFF) IMPLANT
CUFF TRNQT CYL 34X4.125X (TOURNIQUET CUFF) ×1 IMPLANT
DECANTER SPIKE VIAL GLASS SM (MISCELLANEOUS) ×2 IMPLANT
DRAPE INCISE IOBAN 66X45 STRL (DRAPES) IMPLANT
DRAPE ORTHO SPLIT 77X108 STRL (DRAPES) ×6
DRAPE SURG ORHT 6 SPLT 77X108 (DRAPES) ×3 IMPLANT
DRAPE U-SHAPE 47X51 STRL (DRAPES) ×2 IMPLANT
DRSG AQUACEL AG ADV 3.5X14 (GAUZE/BANDAGES/DRESSINGS) ×1 IMPLANT
DURAPREP 26ML APPLICATOR (WOUND CARE) ×4 IMPLANT
ELECT CAUTERY BLADE 6.4 (BLADE) ×2 IMPLANT
ELECT REM PT RETURN 9FT ADLT (ELECTROSURGICAL) ×2
ELECTRODE REM PT RTRN 9FT ADLT (ELECTROSURGICAL) ×1 IMPLANT
GAUZE SPONGE 4X4 12PLY STRL (GAUZE/BANDAGES/DRESSINGS) ×2 IMPLANT
GLOVE SRG 8 PF TXTR STRL LF DI (GLOVE) ×1 IMPLANT
GLOVE SURG LTX SZ7 (GLOVE) ×2 IMPLANT
GLOVE SURG LTX SZ8 (GLOVE) ×2 IMPLANT
GLOVE SURG UNDER POLY LF SZ7 (GLOVE) ×2 IMPLANT
GLOVE SURG UNDER POLY LF SZ8 (GLOVE) ×2
GOWN STRL REUS W/ TWL LRG LVL3 (GOWN DISPOSABLE) ×3 IMPLANT
GOWN STRL REUS W/TWL LRG LVL3 (GOWN DISPOSABLE) ×6
HANDPIECE INTERPULSE COAX TIP (DISPOSABLE) ×2
HOOD PEEL AWAY FLYTE STAYCOOL (MISCELLANEOUS) ×6 IMPLANT
IMMOBILIZER KNEE 22 UNIV (SOFTGOODS) ×1 IMPLANT
INSERT TIB BEARING SZ3 11 (Miscellaneous) ×1 IMPLANT
KIT BASIN OR (CUSTOM PROCEDURE TRAY) ×2 IMPLANT
KIT TURNOVER KIT B (KITS) ×2 IMPLANT
KNEE FEM COMP 3 L (Joint) ×1 IMPLANT
MANIFOLD NEPTUNE II (INSTRUMENTS) ×2 IMPLANT
NDL 18GX1X1/2 (RX/OR ONLY) (NEEDLE) IMPLANT
NDL SPNL 18GX3.5 QUINCKE PK (NEEDLE) ×1 IMPLANT
NEEDLE 18GX1X1/2 (RX/OR ONLY) (NEEDLE) ×2 IMPLANT
NEEDLE 22X1 1/2 (OR ONLY) (NEEDLE) ×4 IMPLANT
NEEDLE SPNL 18GX3.5 QUINCKE PK (NEEDLE) ×2 IMPLANT
NS IRRIG 1000ML POUR BTL (IV SOLUTION) ×4 IMPLANT
PACK TOTAL JOINT (CUSTOM PROCEDURE TRAY) ×2 IMPLANT
PAD ARMBOARD 7.5X6 YLW CONV (MISCELLANEOUS) ×4 IMPLANT
PAD CAST 4YDX4 CTTN HI CHSV (CAST SUPPLIES) ×1 IMPLANT
PADDING CAST COTTON 4X4 STRL (CAST SUPPLIES) ×2
PADDING CAST COTTON 6X4 STRL (CAST SUPPLIES) ×2 IMPLANT
PATELLA 32MMX10MM (Knees) ×1 IMPLANT
PIN FLUTED HEDLESS FIX 3.5X1/8 (PIN) ×1 IMPLANT
SET HNDPC FAN SPRY TIP SCT (DISPOSABLE) ×1 IMPLANT
STRIP CLOSURE SKIN 1/2X4 (GAUZE/BANDAGES/DRESSINGS) ×3 IMPLANT
SUCTION FRAZIER HANDLE 10FR (MISCELLANEOUS) ×2
SUCTION TUBE FRAZIER 10FR DISP (MISCELLANEOUS) ×1 IMPLANT
SUT MNCRL AB 3-0 PS2 18 (SUTURE) ×2 IMPLANT
SUT VIC AB 0 CT1 27 (SUTURE) ×10
SUT VIC AB 0 CT1 27XBRD ANBCTR (SUTURE) ×3 IMPLANT
SUT VIC AB 1 CT1 27 (SUTURE) ×14
SUT VIC AB 1 CT1 27XBRD ANBCTR (SUTURE) ×5 IMPLANT
SUT VIC AB 1 CT1 36 (SUTURE) ×1 IMPLANT
SUT VIC AB 2-0 CT1 27 (SUTURE) ×10
SUT VIC AB 2-0 CT1 TAPERPNT 27 (SUTURE) ×4 IMPLANT
SYR 30ML LL (SYRINGE) ×6 IMPLANT
SYR TB 1ML LUER SLIP (SYRINGE) ×2 IMPLANT
TOWEL GREEN STERILE (TOWEL DISPOSABLE) ×4 IMPLANT
TOWEL GREEN STERILE FF (TOWEL DISPOSABLE) ×4 IMPLANT
TRAY CATH 16FR W/PLASTIC CATH (SET/KITS/TRAYS/PACK) ×1 IMPLANT
WATER STERILE IRR 1000ML POUR (IV SOLUTION) ×1 IMPLANT
YANKAUER SUCT BULB TIP NO VENT (SUCTIONS) ×2 IMPLANT

## 2021-04-23 NOTE — Anesthesia Procedure Notes (Signed)
Anesthesia Regional Block: Adductor canal block   Pre-Anesthetic Checklist: , timeout performed,  Correct Patient, Correct Site, Correct Laterality,  Correct Procedure, Correct Position, site marked,  Risks and benefits discussed,  Surgical consent,  Pre-op evaluation,  At surgeon's request and post-op pain management  Laterality: Left  Prep: chloraprep       Needles:  Injection technique: Single-shot  Needle Type: Echogenic Stimulator Needle     Needle Length: 10cm  Needle Gauge: 20     Additional Needles:   Procedures:,,,, ultrasound used (permanent image in chart),,    Narrative:  Start time: 04/23/2021 10:05 AM End time: 04/23/2021 10:10 AM Injection made incrementally with aspirations every 5 mL.  Performed by: Personally  Anesthesiologist: Merlinda Frederick, MD  Additional Notes: A functioning IV was confirmed and monitors were applied.  Sterile prep and drape, hand hygiene and sterile gloves were used.  Negative aspiration and test dose prior to incremental administration of local anesthetic. The patient tolerated the procedure well.Ultrasound  guidance: relevant anatomy identified, needle position confirmed, local anesthetic spread visualized around nerve(s), vascular puncture avoided.  Image printed for medical record.

## 2021-04-23 NOTE — Op Note (Signed)
NAME: Carol Tucker, Carol Tucker MEDICAL RECORD NO: [id number] ACCOUNT NO: [id number] DATE OF BIRTH: [birth year] FACILITY: [facility] LOCATION: [facility] PHYSICIAN: Yetta Barre. Marlou Sa, MD  Operative Report   DATE OF PROCEDURE: 04/23/2021  PREOPERATIVE DIAGNOSIS:  Left knee arthritis.  POSTOPERATIVE DIAGNOSIS:  Left knee arthritis.  PROCEDURE:  Left total knee replacement using posterior cruciate retaining cemented [employer] components Triathlon size 3 femur, size 3 tibia, 11 mm deep dish polyethylene insert and 32 mm 3-peg cemented patella.  SURGEON:  Yetta Barre. Marlou Sa, MD  ASSISTANT:  Annie Main, PA.  INDICATIONS:  The patient is a 80 year old patient with end-stage left knee arthritis.  She did well from right total knee replacement approximately 4 months ago and presents now for left knee replacement after explanation of risks and benefits.  DESCRIPTION OF PROCEDURE:  The patient was brought to the operating room where spinal anesthetic was induced.  Preoperative antibiotics were administered.  Timeout was called.  Left leg had a flexion contracture of about 10 degrees with flexion to  approximately 110 degrees.  Left leg was pre-scrubbed with alcohol, Betadine, and allowed to air dry, prepped with DuraPrep solution and draped in a sterile manner.  Ioban used to cover the operative field.  The leg was elevated and exsanguinated with  the Esmarch wrap.  Tourniquet was inflated.  Anterior approach to the knee was made.  Skin and subcutaneous tissue were sharply divided.  The IrriSept solution utilized.  Median parapatellar arthrotomy was made and marked with a #1 Vicryl suture.  The  IrriSept solution used within the arthrotomy as well.  Lateral patellofemoral ligament was released.  Fat pad partially excised.  Soft tissue removed from the anterior distal femur.  Mild amount of soft tissue dissection was performed along the medial  soft tissue sleeve because of her preoperative varus deformity.   Tricompartmental arthritis was present, which was severe.  ACL was released.  The anterior horn of the lateral meniscus was released.  At this time, intramedullary alignment was then used  to make a cut on the tibia perpendicular to the mechanical axis.  Cut was made 9 mm off the least affected lateral tibial plateau.  A cut was made.  Bone quality was good.  Next, the distal femur was cut in 5 degrees of valgus with resection of 10 mm.   This gave a very nice symmetric extension gap.  Anterior, posterior and chamfer cuts were then made had a size 3.  No notching occurred.  Next, the tibia was placed in the appropriate amount of external rotation along the medial third of the tibial  tubercle.  Pins were used to hold it in position.  Trial reduction was then performed with the femur and then a 9 and 11 mm spacer.  The 11 mm spacer gave excellent extension with good stability to varus and valgus stress at 0, 30 and 90 degrees with no  lift-off after PCL recession.  The patella was then cut down from 24 to 14 and a 3-PEG 32 mm patella trial was placed.  With trial components in position the patient had very good alignment, full extension, excellent flexion with no liftoff, and  excellent patellar tracking using no thumbs technique.  Trial components were removed.  After keel punching the tibia.  Thorough irrigation performed with 3 liters of pulsatile irrigation followed by a solution of Marcaine, Exparel and saline injected  into the capsule.  TXA sponge allowed to sit for 3 minutes along with IrriSept sponge.  The components  were then cemented into position beginning with the tibia, femur and then 11 mm poly spacer.  Excess cement removed.  The patella was also cemented.   Cement hardening occurred.  Tourniquet was released, bleeding points encountered were controlled using electrocautery.  The patient had a very nice range of motion following release.  Thorough irrigation was performed with pouring  irrigation.  Arthrotomy  was closed over a bolster using #1 Vicryl suture.  Prior to full arthrotomy closure IrriSept solution Vancomycin powder placed.  Arthrotomy was then closed with #1 Vicryl suture.  Solution of Marcaine, morphine, clonidine then injected into the knee for  postoperative pain relief.  Next, the closure was achieved using interrupted inverted 0 Vicryl suture, 2-0 Vicryl suture, and a 3-0 Monocryl.  Steri-Strips, Aquacel dressing, Ace wraps, IceMan and knee immobilizer were placed.  The patient was then  transferred to the recovery room in stable condition, tolerated the procedure well without immediate complication.  Luke's assistance was required at all times for retraction, opening, closing, mobilization of tissue.  His assistance was a medical necessity.   PUS D: 04/23/2021 2:25:11 pm T: 04/23/2021 3:03:00 pm  JOB: [id number]

## 2021-04-23 NOTE — Brief Op Note (Signed)
   04/23/2021  2:18 PM  PATIENT:  Carol Tucker  80 y.o. female  PRE-OPERATIVE DIAGNOSIS:  left knee osteoarthritis  POST-OPERATIVE DIAGNOSIS:  left knee osteoarthritis  PROCEDURE:  Procedure(s): LEFT TOTAL KNEE ARTHROPLASTY  SURGEON:  Surgeon(s): Meredith Pel, MD  ASSISTANT: magnant pa  ANESTHESIA:   spinal  EBL: 75 ml    Total I/O In: 1500 [I.V.:800; IV Piggyback:700] Out: 675 [Urine:600; Blood:75]  BLOOD ADMINISTERED: none  DRAINS: none   LOCAL MEDICATIONS USED:  marcaine mso4 clonidine exparel vanco  SPECIMEN:  No Specimen  COUNTS:  YES  TOURNIQUET:   Total Tourniquet Time Documented: Thigh (Left) - 80 minutes Total: Thigh (Left) - 80 minutes   DICTATION: .Other Dictation: Dictation Number [id number]  PLAN OF CARE: Admit for overnight observation  PATIENT DISPOSITION:  PACU - hemodynamically stable

## 2021-04-23 NOTE — Anesthesia Postprocedure Evaluation (Signed)
Anesthesia Post Note  Patient: Carol Tucker  Procedure(s) Performed: LEFT TOTAL KNEE ARTHROPLASTY (Left: Knee)     Patient location during evaluation: PACU Anesthesia Type: Regional and Spinal Level of consciousness: oriented and awake and alert Pain management: pain level controlled Vital Signs Assessment: post-procedure vital signs reviewed and stable Respiratory status: spontaneous breathing and respiratory function stable Cardiovascular status: blood pressure returned to baseline and stable Postop Assessment: no headache, no backache, no apparent nausea or vomiting and patient able to bend at knees Anesthetic complications: no   No notable events documented.  Last Vitals:  Vitals:   04/23/21 1523 04/23/21 1646  BP: (!) 110/58 (!) 129/57  Pulse: 63 65  Resp: 14 18  Temp:  (!) 36.4 C  SpO2: 97% 100%    Last Pain:  Vitals:   04/23/21 1646  TempSrc: Oral  PainSc:                  Merlinda Frederick

## 2021-04-23 NOTE — Anesthesia Procedure Notes (Signed)
Spinal  Patient location during procedure: OR Start time: 04/23/2021 11:30 AM End time: 04/23/2021 11:35 AM Reason for block: surgical anesthesia Staffing Performed: anesthesiologist  Anesthesiologist: Merlinda Frederick, MD Preanesthetic Checklist Completed: patient identified, IV checked, risks and benefits discussed, surgical consent, monitors and equipment checked, pre-op evaluation and timeout performed Spinal Block Patient position: sitting Prep: DuraPrep Patient monitoring: cardiac monitor, continuous pulse ox and blood pressure Approach: midline Location: L3-4 Injection technique: single-shot Needle Needle type: Pencan  Needle gauge: 24 G Needle length: 9 cm Assessment Events: CSF return Additional Notes Functioning IV was confirmed and monitors were applied. Sterile prep and drape, including hand hygiene and sterile gloves were used. The patient was positioned and the spine was prepped. The skin was anesthetized with lidocaine.  Free flow of clear CSF was obtained prior to injecting local anesthetic into the CSF.  The spinal needle aspirated freely following injection.  The needle was carefully withdrawn.  The patient tolerated the procedure well.

## 2021-04-23 NOTE — Anesthesia Procedure Notes (Signed)
Procedure Name: MAC Date/Time: 04/23/2021 11:20 AM Performed by: Michele Rockers, CRNA Pre-anesthesia Checklist: Patient identified, Emergency Drugs available, Suction available, Timeout performed and Patient being monitored Patient Re-evaluated:Patient Re-evaluated prior to induction Oxygen Delivery Method: Simple face mask

## 2021-04-23 NOTE — Progress Notes (Signed)
Pt seen in room alert/oriented in no apparent distress. S/p Left TKA with ace wrap/knee immobilizer. Pt's guide.menu provided with instructions. Orientated to room /equiipments, and hospital valuables policy has been discussed with no complaints. Hospital bed in lowest position with 3 side rails up, call bell/room phone within reach and all wheels locked.

## 2021-04-23 NOTE — H&P (Signed)
TOTAL KNEE ADMISSION H&P  Patient is being admitted for left total knee arthroplasty.  Subjective:  Chief Complaint:left knee pain.  HPI: Carol Tucker, 80 y.o. female, has a history of pain and functional disability in the left knee due to arthritis and has failed non-surgical conservative treatments for greater than 12 weeks to includeNSAID's and/or analgesics, corticosteriod injections, viscosupplementation injections, flexibility and strengthening excercises, and activity modification.  Onset of symptoms was gradual, starting 10 years ago with gradually worsening course since that time. The patient noted prior procedures on the knee to include  arthroscopy on the left knee(s).  Patient currently rates pain in the left knee(s) at 8 out of 10 with activity. Patient has night pain, worsening of pain with activity and weight bearing, pain that interferes with activities of daily living, pain with passive range of motion, crepitus, and joint swelling.  Patient has evidence of subchondral sclerosis and joint space narrowing by imaging studies. This patient has had  a good result with her right total knee replacement done approximately 3 months ago.  No personal or family history of DVT or pulmonary embolism.  She has a daughter here in town with whom she stays with to recover. . There is no active infection.  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 FRAX does not indicate need for bisphosphonate but density has sig worsened since prior dexa on 12/17/2009   Pre-diabetes    Prediabetes     Past Surgical History:  Procedure Laterality Date   ABDOMINAL EXPLORATION SURGERY N/A    40 years ago   APPENDECTOMY     CATARACT EXTRACTION W/ INTRAOCULAR LENS  IMPLANT, BILATERAL Bilateral    5 years ago   DILATION AND CURETTAGE OF UTERUS  08/19/1967   MISSED AB   KNEE ARTHROSCOPY  08/18/2005   NECK SURGERY  08/18/2006   NECK TUMOR   TOTAL KNEE ARTHROPLASTY Right 12/11/2020   Procedure: RIGHT TOTAL KNEE ARTHROPLASTY;  Surgeon: Meredith Pel, MD;  Location: [facility];  Service: Orthopedics;  Laterality: Right;   TUBAL LIGATION  08/18/1978   tumor removal salivary gland      Current Facility-Administered Medications  Medication Dose Route Frequency Provider Last Rate Last Admin   ceFAZolin (ANCEF) IVPB 2g/100 mL premix  2 g Intravenous On Call to OR Magnant, Charles L, PA-C       fentaNYL (SUBLIMAZE) injection 50 mcg  50 mcg Intravenous Once [place],  Jacolyn Reedy, MD       lactated ringers infusion   Intravenous Continuous Myrtie Soman, MD 10 mL/hr at 04/23/21 [phone] New Bag at 04/23/21 0938   midazolam (VERSED) 2 MG/2ML injection            povidone-iodine (BETADINE) 7.5 % scrub   Topical Once Magnant, Charles L, PA-C       povidone-iodine 10 % swab 2 application  2 application Topical Once Magnant, Charles L, PA-C       tranexamic acid (CYKLOKAPRON) 2,000 mg in sodium chloride 0.9 % 50 mL Topical Application  123XX123 mg Topical To OR Meredith Pel, MD       tranexamic acid (CYKLOKAPRON)  IVPB 1,000 mg  1,000 mg Intravenous To OR Magnant, Charles L, PA-C       Facility-Administered Medications Ordered in Other Encounters  Medication Dose Route Frequency Provider Last Rate Last Admin   bupivacaine (MARCAINE) 0.5 % injection   Peri-NEURAL Anesthesia Intra-op Merlinda Frederick, MD   20 mL at 04/23/21 1010   Allergies  Allergen Reactions   Hydrocodone Nausea Only   Metoclopramide Hcl     REACTION: tremors, clinched jaws     REGLAN    Social History   Tobacco Use   Smoking status: Former    Types: Cigarettes    Quit date: 1975    Years since quitting: 47.7   Smokeless tobacco: Never  Substance Use Topics   Alcohol use: Yes    Comment: Rare- 2 glasses of wine a year    Family History  Problem Relation Age of Onset   Hypertension Father    Heart disease Father    Cancer Father        BLADDER & LUNG   Heart disease Paternal Grandmother    Alzheimer's disease Mother    Heart disease Brother    Heart disease Paternal Grandfather    Diabetes Paternal Grandfather    Colon cancer Neg Hx    Pancreatic cancer Neg Hx    Stomach cancer Neg Hx      Review of Systems  Musculoskeletal:  Positive for arthralgias.  All other systems reviewed and are negative.  Objective:  Physical Exam Vitals reviewed.  HENT:     Head: Normocephalic.     Nose: Nose normal.     Mouth/Throat:     Mouth: Mucous membranes are moist.  Eyes:     Pupils: Pupils are equal, round, and reactive to light.  Cardiovascular:     Rate and Rhythm: Normal rate.     Pulses: Normal pulses.  Pulmonary:     Effort: Pulmonary effort is normal.  Abdominal:     General: Abdomen is flat.  Musculoskeletal:     Cervical back: Normal range of motion.  Skin:    General: Skin is warm.     Capillary Refill: Capillary refill takes less than 2 seconds.  Neurological:     General: No focal deficit present.     Mental Status: She is alert.  Psychiatric:        Mood and Affect: Mood normal.  Examination of  the left knee demonstrates range of motion of Jiaxi of 10 degree flexion contracture to about 10 5-1 10 of flexion.  Collaterals are stable.  Ankle dorsiflexion intact.  Pedal pulses palpable.  Vital signs in last 24 hours: Temp:  [98.3 F (36.8 C)] 98.3 F (36.8 C) (09/06 0916) Pulse Rate:  [58-64] 60 (09/06 1030) Resp:  [9-23] 23 (09/06  1030) BP: (136-156)/(46-58) 150/49 (09/06 1030) SpO2:  [100 %] 100 % (09/06 1030) Weight:  [61.2 kg] 61.2 kg (09/06 0916)  Labs:   Estimated body mass index is 23.91 kg/m as calculated from the following:   Height as of this encounter: '5\' 3"'$  (1.6 m).   Weight as of this encounter: 61.2 kg.   Imaging Review Plain radiographs demonstrate severe degenerative joint disease of the left knee(s). The overall alignment ismild varus. The bone quality appears to be good for age and reported activity level.      Assessment/Plan:  End stage arthritis, left knee   The patient history, physical examination, clinical judgment of the provider and imaging studies are consistent with end stage degenerative joint disease of the left knee(s) and total knee arthroplasty is deemed medically necessary. The treatment options including medical management, injection therapy arthroscopy and arthroplasty were discussed at length. The risks and benefits of total knee arthroplasty were presented and reviewed. The risks due to aseptic loosening, infection, stiffness, patella tracking problems, thromboembolic complications and other imponderables were discussed. The patient acknowledged the explanation, agreed to proceed with the plan and consent was signed. Patient is being admitted for inpatient treatment for surgery, pain control, PT, OT, prophylactic antibiotics, VTE prophylaxis, progressive ambulation and ADL's and discharge planning. The patient is planning to be discharged home with home health services     Patient's anticipated LOS is less than 2 midnights, meeting  these requirements: - Younger than 80 - Lives within 1 hour of care - Has a competent adult at home to recover with post-op recover - NO history of  - Chronic pain requiring opiods  - Diabetes  - Coronary Artery Disease  - Heart failure  - Heart attack  - Stroke  - DVT/VTE  - Cardiac arrhythmia  - Respiratory Failure/COPD  - Renal failure  - Anemia  - Advanced Liver disease

## 2021-04-23 NOTE — Transfer of Care (Signed)
Immediate Anesthesia Transfer of Care Note  Patient: Carol Tucker  Procedure(s) Performed: LEFT TOTAL KNEE ARTHROPLASTY (Left: Knee)  Patient Location: PACU  Anesthesia Type:Spinal and MAC combined with regional for post-op pain  Level of Consciousness: awake, patient cooperative and responds to stimulation  Airway & Oxygen Therapy: Patient Spontanous Breathing and Patient connected to nasal cannula oxygen  Post-op Assessment: Report given to RN and Post -op Vital signs reviewed and stable  Post vital signs: Reviewed and stable  Last Vitals:  Vitals Value Taken Time  BP 105/58 04/23/21 1423  Temp    Pulse 64 04/23/21 1425  Resp 25 04/23/21 1425  SpO2 96 % 04/23/21 1425  Vitals shown include unvalidated device data.  Last Pain:  Vitals:   04/23/21 1030  TempSrc:   PainSc: 0-No pain      Patients Stated Pain Goal: 5 (32/99/24 2683)  Complications: No notable events documented.

## 2021-04-23 NOTE — Anesthesia Preprocedure Evaluation (Addendum)
Anesthesia Evaluation  Patient identified by MRN, date of birth, ID band Patient awake and Patient confused    Reviewed: Allergy & Precautions, NPO status , Patient's Chart, lab work & pertinent test results  History of Anesthesia Complications Negative for: history of anesthetic complications  Airway Mallampati: II  TM Distance: >3 FB Neck ROM: Full    Dental no notable dental hx.    Pulmonary neg pulmonary ROS, former smoker,    Pulmonary exam normal        Cardiovascular hypertension, Pt. on medications Normal cardiovascular exam     Neuro/Psych  Headaches, PSYCHIATRIC DISORDERS Anxiety Depression    GI/Hepatic Neg liver ROS, GERD  ,  Endo/Other  diabetes (pre-DM)  Renal/GU negative Renal ROS  negative genitourinary   Musculoskeletal  (+) Arthritis ,   Abdominal   Peds  Hematology negative hematology ROS (+)   Anesthesia Other Findings   Reproductive/Obstetrics                            Anesthesia Physical  Anesthesia Plan  ASA: 2  Anesthesia Plan: Spinal and Regional   Post-op Pain Management:  Regional for Post-op pain   Induction:   PONV Risk Score and Plan: 2 and Propofol infusion, Treatment may vary due to age or medical condition, Ondansetron and TIVA  Airway Management Planned: Simple Face Mask  Additional Equipment: None  Intra-op Plan:   Post-operative Plan:   Informed Consent: I have reviewed the patients History and Physical, chart, labs and discussed the procedure including the risks, benefits and alternatives for the proposed anesthesia with the patient or authorized representative who has indicated his/her understanding and acceptance.       Plan Discussed with: CRNA, Anesthesiologist and Surgeon  Anesthesia Plan Comments: (Adductor canal block. Spinal. GA/LMA as backup Norton Blizzard, MD  )       Anesthesia Quick Evaluation

## 2021-04-24 ENCOUNTER — Telehealth: Payer: Self-pay | Admitting: Orthopedic Surgery

## 2021-04-24 ENCOUNTER — Encounter (HOSPITAL_COMMUNITY): Payer: Self-pay | Admitting: Orthopedic Surgery

## 2021-04-24 DIAGNOSIS — M1712 Unilateral primary osteoarthritis, left knee: Secondary | ICD-10-CM

## 2021-04-24 MED ORDER — ASPIRIN 81 MG PO CHEW: 81.0000 mg | CHEWABLE_TABLET | Freq: Every day | ORAL | 0 refills | Status: DC

## 2021-04-24 MED ORDER — GABAPENTIN 300 MG PO CAPS: 300.0000 mg | ORAL_CAPSULE | Freq: Three times a day (TID) | ORAL | 0 refills | Status: DC

## 2021-04-24 MED ORDER — TRAMADOL HCL 50 MG PO TABS: 50.0000 mg | ORAL_TABLET | Freq: Four times a day (QID) | ORAL | 0 refills | Status: DC | PRN

## 2021-04-24 MED ORDER — TRAMADOL HCL 50 MG PO TABS: 50.0000 mg | ORAL_TABLET | Freq: Four times a day (QID) | ORAL | 0 refills | Status: AC | PRN

## 2021-04-24 MED ORDER — MELOXICAM 7.5 MG PO TABS
7.5000 mg | ORAL_TABLET | Freq: Every day | ORAL | 0 refills | Status: DC
Start: 2021-04-25 — End: 2021-04-24

## 2021-04-24 MED ORDER — ASPIRIN 81 MG PO CHEW: 81.0000 mg | CHEWABLE_TABLET | Freq: Every day | ORAL | 0 refills | Status: AC

## 2021-04-24 MED ORDER — GABAPENTIN 300 MG PO CAPS: 300.0000 mg | ORAL_CAPSULE | Freq: Three times a day (TID) | ORAL | 0 refills | Status: AC

## 2021-04-24 MED ORDER — MELOXICAM 7.5 MG PO TABS: 7.5000 mg | ORAL_TABLET | Freq: Every day | ORAL | 0 refills | Status: AC

## 2021-04-24 NOTE — Progress Notes (Signed)
Patient is placed on CPM 10-40 this morning. Pt tolerates well.

## 2021-04-24 NOTE — Evaluation (Signed)
Physical Therapy Evaluation/Discharge Patient Details Name: Carol Tucker MRN: [id number] DOB: November 15, 1940 Today's Date: 04/24/2021   History of Present Illness     Clinical Impression  Pt tolerated treatment well and recalled knowledge about the importance of mobility and knee extension following a TKA. Pt ambulated 500' in hall and navigated stairs with RW and supervision. Pt demonstrated slightly decreased knee flexion and extension on L in seated and supine position, but is very knowledgeable and is on tract to regaining range. As pt moves well, she does not require acute following of PT. Recommend surgeon's recommendation for follow-up therapy.    Follow Up Recommendations Follow surgeon's recommendation for DC plan and follow-up therapies    Equipment Recommendations  None recommended by PT;Other (comment) (pt has equipment at home)    Recommendations for Other Services       Precautions / Restrictions Precautions Precautions: Knee Precaution Booklet Issued: No Restrictions Weight Bearing Restrictions: Yes LLE Weight Bearing: Weight bearing as tolerated      Mobility  Bed Mobility Overal bed mobility: Modified Independent             General bed mobility comments: Mod I with HOB elevated to 20 degrees. Pt moved very well and had great technique getting OOB    Transfers Overall transfer level: Modified independent Equipment used: Rolling walker (2 wheeled)             General transfer comment: Pt performed sit to stand without any external assist. Great technique, slightly increased time, as first time getting OOB after surgery  Ambulation/Gait Ambulation/Gait assistance: Supervision Gait Distance (Feet): 500 Feet Assistive device: Rolling walker (2 wheeled) Gait Pattern/deviations: Step-through pattern   Gait velocity interpretation: >2.62 ft/sec, indicative of community ambulatory General Gait Details: Supervision for safety. Pt ambulated 500' with  RW. Verbal cues for increased heel strike and standing upright on stance leg to greater activate glutes and quads.  Stairs Stairs: Yes Stairs assistance: Supervision Stair Management: Step to pattern;With walker Number of Stairs: 3 General stair comments: Navigated stairs with RW. Verbalized technique on how to navigate stairs with assist to hold RW. Educated pt on sequence to descend stairs.  Wheelchair Mobility    Modified Rankin (Stroke Patients Only)       Balance Overall balance assessment: Needs assistance Sitting-balance support: Feet supported;No upper extremity supported Sitting balance-Leahy Scale: Good     Standing balance support: During functional activity;Bilateral upper extremity supported Standing balance-Leahy Scale: Fair Standing balance comment: Requires BUE support on RW s/p procedure, but able to maintain static balance at sink to wash hands without support                             Pertinent Vitals/Pain Pain Assessment: No/denies pain    Home Living Family/patient expects to be discharged to:: Private residence Living Arrangements: Children;Other relatives Available Help at Discharge: Family Type of Home: House Home Access: Stairs to enter Entrance Stairs-Rails: Can reach both;Right;Left Entrance Stairs-Number of Steps: 2+1 Home Layout: One level Home Equipment: Walker - 2 wheels;Cane - single point;Shower seat Additional Comments: has equipment from previous TKA on R    Prior Function Level of Independence: Independent               Hand Dominance   Dominant Hand: Right    Extremity/Trunk Assessment   Upper Extremity Assessment Upper Extremity Assessment: Defer to OT evaluation    Lower Extremity Assessment Lower Extremity Assessment:  LLE deficits/detail LLE Deficits / Details: Knee extension slightly limited by <5 degrees. Knee flexion 85 degrees.    Cervical / Trunk Assessment Cervical / Trunk Assessment: Normal   Communication   Communication: No difficulties  Cognition Arousal/Alertness: Awake/alert Behavior During Therapy: WFL for tasks assessed/performed Overall Cognitive Status: Within Functional Limits for tasks assessed                                        General Comments General comments (skin integrity, edema, etc.): VSS on RA    Exercises Total Joint Exercises Quad Sets: AROM;Both;Supine;Other (comment) (3 reps) Straight Leg Raises: AROM;Left;Supine;Other (comment) (3 reps) Knee Flexion: Supine;Left;AROM;Other (comment) (3 reps)   Assessment/Plan    PT Assessment All further PT needs can be met in the next venue of care  PT Problem List Decreased range of motion;Decreased balance       PT Treatment Interventions      PT Goals (Current goals can be found in the Care Plan section)  Acute Rehab PT Goals Patient Stated Goal: to go home PT Goal Formulation: With patient Time For Goal Achievement: 05/08/21 Potential to Achieve Goals: Good    Frequency     Barriers to discharge        Co-evaluation               AM-PAC PT "6 Clicks" Mobility  Outcome Measure Help needed turning from your back to your side while in a flat bed without using bedrails?: None Help needed moving from lying on your back to sitting on the side of a flat bed without using bedrails?: None Help needed moving to and from a bed to a chair (including a wheelchair)?: None Help needed standing up from a chair using your arms (e.g., wheelchair or bedside chair)?: None Help needed to walk in hospital room?: None Help needed climbing 3-5 steps with a railing? : A Little 6 Click Score: 23    End of Session Equipment Utilized During Treatment: Gait belt Activity Tolerance: Patient tolerated treatment well Patient left: with call bell/phone within reach;in chair Nurse Communication: Mobility status PT Visit Diagnosis: Other (comment);Other abnormalities of gait and mobility  (R26.89);Muscle weakness (generalized) (M62.81) (s/p TKA)    Time: 7510-2585 PT Time Calculation (min) (ACUTE ONLY): 23 min   Charges:   PT Evaluation $PT Eval Low Complexity: 1 Low PT Treatments $Gait Training: 8-22 mins        Louie Casa, SPT Acute Rehab: [phone]   Domingo Dimes 04/24/2021, 12:49 PM

## 2021-04-24 NOTE — Progress Notes (Signed)
Ortho tech called for bone foam and CPM.

## 2021-04-24 NOTE — Progress Notes (Signed)
  Subjective: Patient stable.  She feels great.  She would like to be discharged home today.  Pain is controlled.  No issues with pain medicine at this time.   Objective: Vital signs in last 24 hours: Temp:  [97.5 F (36.4 C)-98.3 F (36.8 C)] 97.5 F (36.4 C) (09/07 0756) Pulse Rate:  [58-67] 61 (09/07 0756) Resp:  [9-23] 16 (09/07 0756) BP: (102-156)/(46-58) 102/56 (09/07 0756) SpO2:  [97 %-100 %] 99 % (09/07 0756) Weight:  [61.2 kg] 61.2 kg (09/06 0916)  Intake/Output from previous day: 09/06 0701 - 09/07 0700 In: 1801.7 [P.O.:240; I.V.:811.7; IV Piggyback:750] Out: 675 [Urine:600; Blood:75] Intake/Output this shift: No intake/output data recorded.  Exam:  Sensation intact distally Dorsiflexion/Plantar flexion intact  Labs: No results for input(s): HGB in the last 72 hours. No results for input(s): WBC, RBC, HCT, PLT in the last 72 hours. No results for input(s): NA, K, CL, CO2, BUN, CREATININE, GLUCOSE, CALCIUM in the last 72 hours. No results for input(s): LABPT, INR in the last 72 hours.  Assessment/Plan: Plan at this time is therapy this morning.  Anticipate she should be able to discharge home after 2 physical therapy sessions today.   Carol Tucker 04/24/2021, 8:18 AM

## 2021-04-24 NOTE — Telephone Encounter (Signed)
Pt called requesting her medication be sent to [facility] in [place]. Pt states medication was sent to wrong pharmacy. Pt phone number is [phone].

## 2021-04-24 NOTE — Plan of Care (Signed)
  Problem: Education: Goal: Knowledge of General Education information will improve Description: Including pain rating scale, medication(s)/side effects and non-pharmacologic comfort measures Outcome: Progressing   Problem: Health Behavior/Discharge Planning: Goal: Ability to manage health-related needs will improve Outcome: Progressing   Problem: Clinical Measurements: Goal: Ability to maintain clinical measurements within normal limits will improve Outcome: Progressing   Problem: Activity: Goal: Risk for activity intolerance will decrease Outcome: Progressing   Problem: Nutrition: Goal: Adequate nutrition will be maintained Outcome: Progressing   Problem: Coping: Goal: Level of anxiety will decrease Outcome: Progressing   Problem: Pain Managment: Goal: General experience of comfort will improve Outcome: Progressing   Problem: Elimination: Goal: Will not experience complications related to bowel motility Outcome: Progressing   Problem: Safety: Goal: Ability to remain free from injury will improve Outcome: Progressing   Problem: Skin Integrity: Goal: Risk for impaired skin integrity will decrease Outcome: Progressing

## 2021-04-24 NOTE — Progress Notes (Signed)
Orthopedic Tech Progress Note Patient Details:  Carol Tucker 04-Jan-1941 [id number] Dropped off cpm and bone foam in patient room. Patient was not ready to get on cpm at that time Patient ID: Carol Tucker, female   DOB: July 11, 1941, 80 y.o.   MRN: [id number]  Carol Tucker 04/24/2021, 6:48 AM

## 2021-04-25 NOTE — Telephone Encounter (Signed)
I checked the record in epic and it seems all of the postop medications were sent to the [facility] in [place] so maybe there is some sort of disconnect

## 2021-04-25 NOTE — Telephone Encounter (Signed)
Called pharm. She picked up all 4 Rx.

## 2021-04-26 NOTE — Telephone Encounter (Signed)
thanks

## 2021-04-28 NOTE — Discharge Summary (Signed)
Physician Discharge Summary      Patient ID: Carol Tucker MRN: [id number] DOB/AGE: 05-05-1941 80 y.o.  Admit date: 04/23/2021 Discharge date: 04/24/2021  Admission Diagnoses:  Active Problems:   S/P total knee arthroplasty, left   Discharge Diagnoses:  Same  Surgeries: Procedure(s): LEFT TOTAL KNEE ARTHROPLASTY on 04/23/2021   Consultants:   Discharged Condition: Stable  Hospital Course: Carol Tucker is an 80 y.o. female who was admitted 04/23/2021 with a chief complaint of left knee pain, and found to have a diagnosis of left knee osteoarthritis.  They were brought to the operating room on 04/23/2021 and underwent the above named procedures.  Pt awoke from anesthesia without complication and was transferred to the floor. On POD1, patient's pain was very well controlled.  She was able to ambulate 500 feet with physical therapy.  She had no lightheadedness or dizziness or any chest pain, short of breath, calf pain.  She was able to perform straight leg raise with excellent quad strength.  With her excellent pain control and mobility, she was discharged home on POD 1.  No significant nausea with this hospitalization and contrast to her last hospital stay.  Pt will f/u with Dr. Marlou Sa in clinic in ~2 weeks.   Antibiotics given:  Anti-infectives (From admission, onward)    Start     Dose/Rate Route Frequency Ordered Stop   04/23/21 1700  ceFAZolin (ANCEF) IVPB 1 g/50 mL premix        1 g 100 mL/hr over 30 Minutes Intravenous Every 8 hours 04/23/21 1614 04/23/21 2237   04/23/21 1250  vancomycin (VANCOCIN) powder  Status:  Discontinued          As needed 04/23/21 1250 04/23/21 1419   04/23/21 0915  ceFAZolin (ANCEF) IVPB 2g/100 mL premix        2 g 200 mL/hr over 30 Minutes Intravenous On call to O.R. 04/23/21 [id number] 04/23/21 1150     .  Recent vital signs:  Vitals:   04/24/21 1000 04/24/21 1231  BP: (!) 126/45 (!) 102/44  Pulse: 66 (!) 54  Resp: 16 14  Temp: 97.8 F (36.6  C)   SpO2: 99% 100%    Recent laboratory studies:  Results for orders placed or performed during the hospital encounter of 04/23/21  Glucose, capillary  Result Value Ref Range   Glucose-Capillary 102 (H) 70 - 99 mg/dL  Glucose, capillary  Result Value Ref Range   Glucose-Capillary 84 70 - 99 mg/dL    Discharge Medications:   Allergies as of 04/24/2021       Reactions   Hydrocodone Nausea Only   Metoclopramide Hcl    REACTION: tremors, clinched jaws     REGLAN        Medication List     STOP taking these medications    ondansetron 4 MG tablet Commonly known as: Zofran   oxyCODONE 5 MG immediate release tablet Commonly known as: Roxicodone       TAKE these medications    acetaminophen 500 MG tablet Commonly known as: TYLENOL Take 500 mg by mouth every 6 (six) hours as needed for moderate pain or headache.   aspirin 81 MG chewable tablet Chew 1 tablet (81 mg total) by mouth daily. What changed: when to take this   calcium-vitamin D 500-200 MG-UNIT tablet Commonly known as: OSCAL WITH D Take 1 tablet by mouth daily with breakfast.   cyclobenzaprine 5 MG tablet Commonly known as: FLEXERIL Take 1 tablet (5 mg total)  by mouth 3 (three) times daily as needed for muscle spasms.   famotidine 20 MG tablet Commonly known as: PEPCID TAKE 1 TABLET BY MOUTH TWICE DAILY What changed:  when to take this reasons to take this   gabapentin 300 MG capsule Commonly known as: NEURONTIN Take 1 capsule (300 mg total) by mouth 3 (three) times daily.   ketotifen 0.025 % ophthalmic solution Commonly known as: ZADITOR Place 1 drop into both eyes 2 (two) times daily as needed (allergies).   lisinopril 30 MG tablet Commonly known as: ZESTRIL Take 30 mg by mouth daily. What changed: Another medication with the same name was removed. Continue taking this medication, and follow the directions you see here.   meloxicam 7.5 MG tablet Commonly known as: MOBIC Take 1 tablet  (7.5 mg total) by mouth daily.   pravastatin 40 MG tablet Commonly known as: PRAVACHOL TAKE 1 TABLET BY MOUTH EVERY DAY   PreserVision AREDS 2 Caps Take 1 capsule by mouth 2 (two) times daily.   sodium chloride 0.65 % Soln nasal spray Commonly known as: OCEAN Place 1 spray into both nostrils as needed for congestion.   traMADol 50 MG tablet Commonly known as: ULTRAM Take 1-2 tablets (50-100 mg total) by mouth every 6 (six) hours as needed.        Diagnostic Studies: No results found.  Disposition: Discharge disposition: 01-Home or Self Care       Discharge Instructions     Call MD / Call 911   Complete by: As directed    If you experience chest pain or shortness of breath, CALL 911 and be transported to the hospital emergency room.  If you develope a fever above 101 F, pus (white drainage) or increased drainage or redness at the wound, or calf pain, call your surgeon's office.   Constipation Prevention   Complete by: As directed    Drink plenty of fluids.  Prune juice may be helpful.  You may use a stool softener, such as Colace (over the counter) 100 mg twice a day.  Use MiraLax (over the counter) for constipation as needed.   Diet - low sodium heart healthy   Complete by: As directed    Discharge instructions   Complete by: As directed    You may shower, dressing is waterproof.  Do not remove the dressing, we will remove it at your first post-op appointment.  Do not take a bath or soak the knee in a tub or pool.  You may weightbear as you can tolerate on the operative leg with a walker.  Continue using the CPM machine 3 times per day for one hour each time, increasing the degrees of range of motion daily.  Use the blue cradle boot under your heel to work on getting your leg straight.  Do NOT put a pillow under your knee.  You will follow-up with Dr. Marlou Sa in the clinic in 2 weeks at your given appointment date.    INSTRUCTIONS AFTER JOINT REPLACEMENT   Remove items at  home which could result in a fall. This includes throw rugs or furniture in walking pathways ICE to the affected joint every three hours while awake for 30 minutes at a time, for at least the first 3-5 days, and then as needed for pain and swelling.  Continue to use ice for pain and swelling. You may notice swelling that will progress down to the foot and ankle.  This is normal after surgery.  Elevate your  leg when you are not up walking on it.   Continue to use the breathing machine you got in the hospital (incentive spirometer) which will help keep your temperature down.  It is common for your temperature to cycle up and down following surgery, especially at night when you are not up moving around and exerting yourself.  The breathing machine keeps your lungs expanded and your temperature down.   DIET:  As you were doing prior to hospitalization, we recommend a well-balanced diet.  DRESSING / WOUND CARE / SHOWERING  Keep the surgical dressing until follow up.  The dressing is water proof, so you can shower without any extra covering.  IF THE DRESSING FALLS OFF or the wound gets wet inside, change the dressing with sterile gauze.  Please use good hand washing techniques before changing the dressing.  Do not use any lotions or creams on the incision until instructed by your surgeon.    ACTIVITY  Increase activity slowly as tolerated, but follow the weight bearing instructions below.   No driving for 6 weeks or until further direction given by your physician.  You cannot drive while taking narcotics.  No lifting or carrying greater than 10 lbs. until further directed by your surgeon. Avoid periods of inactivity such as sitting longer than an hour when not asleep. This helps prevent blood clots.  You may return to work once you are authorized by your doctor.     WEIGHT BEARING   Weight bearing as tolerated with assist device (walker, cane, etc) as directed, use it as long as suggested by your  surgeon or therapist, typically at least 4-6 weeks.   EXERCISES  Results after joint replacement surgery are often greatly improved when you follow the exercise, range of motion and muscle strengthening exercises prescribed by your doctor. Safety measures are also important to protect the joint from further injury. Any time any of these exercises cause you to have increased pain or swelling, decrease what you are doing until you are comfortable again and then slowly increase them. If you have problems or questions, call your caregiver or physical therapist for advice.   Rehabilitation is important following a joint replacement. After just a few days of immobilization, the muscles of the leg can become weakened and shrink (atrophy).  These exercises are designed to build up the tone and strength of the thigh and leg muscles and to improve motion. Often times heat used for twenty to thirty minutes before working out will loosen up your tissues and help with improving the range of motion but do not use heat for the first two weeks following surgery (sometimes heat can increase post-operative swelling).   These exercises can be done on a training (exercise) mat, on the floor, on a table or on a bed. Use whatever works the best and is most comfortable for you.    Use music or television while you are exercising so that the exercises are a pleasant break in your day. This will make your life better with the exercises acting as a break in your routine that you can look forward to.   Perform all exercises about fifteen times, three times per day or as directed.  You should exercise both the operative leg and the other leg as well.  Exercises include:   Quad Sets - Tighten up the muscle on the front of the thigh (Quad) and hold for 5-10 seconds.   Straight Leg Raises - With your knee straight (if  you were given a brace, keep it on), lift the leg to 60 degrees, hold for 3 seconds, and slowly lower the leg.   Perform this exercise against resistance later as your leg gets stronger.  Leg Slides: Lying on your back, slowly slide your foot toward your buttocks, bending your knee up off the floor (only go as far as is comfortable). Then slowly slide your foot back down until your leg is flat on the floor again.  Angel Wings: Lying on your back spread your legs to the side as far apart as you can without causing discomfort.  Hamstring Strength:  Lying on your back, push your heel against the floor with your leg straight by tightening up the muscles of your buttocks.  Repeat, but this time bend your knee to a comfortable angle, and push your heel against the floor.  You may put a pillow under the heel to make it more comfortable if necessary.   A rehabilitation program following joint replacement surgery can speed recovery and prevent re-injury in the future due to weakened muscles. Contact your doctor or a physical therapist for more information on knee rehabilitation.    CONSTIPATION  Constipation is defined medically as fewer than three stools per week and severe constipation as less than one stool per week.  Even if you have a regular bowel pattern at home, your normal regimen is likely to be disrupted due to multiple reasons following surgery.  Combination of anesthesia, postoperative narcotics, change in appetite and fluid intake all can affect your bowels.   YOU MUST use at least one of the following options; they are listed in order of increasing strength to get the job done.  They are all available over the counter, and you may need to use some, POSSIBLY even all of these options:    Drink plenty of fluids (prune juice may be helpful) and high fiber foods Colace 100 mg by mouth twice a day  Senokot for constipation as directed and as needed Dulcolax (bisacodyl), take with full glass of water  Miralax (polyethylene glycol) once or twice a day as needed.  If you have tried all these things and are  unable to have a bowel movement in the first 3-4 days after surgery call either your surgeon or your primary doctor.    If you experience loose stools or diarrhea, hold the medications until you stool forms back up.  If your symptoms do not get better within 1 week or if they get worse, check with your doctor.  If you experience "the worst abdominal pain ever" or develop nausea or vomiting, please contact the office immediately for further recommendations for treatment.   ITCHING:  If you experience itching with your medications, try taking only a single pain pill, or even half a pain pill at a time.  You can also use Benadryl over the counter for itching or also to help with sleep.   TED HOSE STOCKINGS:  Use stockings on both legs until for at least 2 weeks or as directed by physician office. They may be removed at night for sleeping.  MEDICATIONS:  See your medication summary on the "After Visit Summary" that nursing will review with you.  You may have some home medications which will be placed on hold until you complete the course of blood thinner medication.  It is important for you to complete the blood thinner medication as prescribed.  PRECAUTIONS:  If you experience chest pain or shortness of breath -  call 911 immediately for transfer to the hospital emergency department.   If you develop a fever greater that 101 F, purulent drainage from wound, increased redness or drainage from wound, foul odor from the wound/dressing, or calf pain - CONTACT YOUR SURGEON.                                                   FOLLOW-UP APPOINTMENTS:  If you do not already have a post-op appointment, please call the office for an appointment to be seen by your surgeon.  Guidelines for how soon to be seen are listed in your "After Visit Summary", but are typically between 1-4 weeks after surgery.  OTHER INSTRUCTIONS:   Knee Replacement:  Do not place pillow under knee, focus on keeping the knee straight while  resting. CPM instructions: 0-90 degrees, 2 hours in the morning, 2 hours in the afternoon, and 2 hours in the evening. Place foam block, curve side up under heel at all times except when in CPM or when walking.  DO NOT modify, tear, cut, or change the foam block in any way.  POST-OPERATIVE OPIOID TAPER INSTRUCTIONS: It is important to wean off of your opioid medication as soon as possible. If you do not need pain medication after your surgery it is ok to stop day one. Opioids include: Codeine, Hydrocodone(Norco, Vicodin), Oxycodone(Percocet, oxycontin) and hydromorphone amongst others.  Long term and even short term use of opiods can cause: Increased pain response Dependence Constipation Depression Respiratory depression And more.  Withdrawal symptoms can include Flu like symptoms Nausea, vomiting And more Techniques to manage these symptoms Hydrate well Eat regular healthy meals Stay active Use relaxation techniques(deep breathing, meditating, yoga) Do Not substitute Alcohol to help with tapering If you have been on opioids for less than two weeks and do not have pain than it is ok to stop all together.  Plan to wean off of opioids This plan should start within one week post op of your joint replacement. Maintain the same interval or time between taking each dose and first decrease the dose.  Cut the total daily intake of opioids by one tablet each day Next start to increase the time between doses. The last dose that should be eliminated is the evening dose.   MAKE SURE YOU:  Understand these instructions.  Get help right away if you are not doing well or get worse.    Thank you for letting us be a part of your medical care team.  It is a privilege we respect greatly.  We hope these instructions will help you stay on track for a fast and full recovery!    Dental Antibiotics:  In most cases prophylactic antibiotics for Dental procdeures after total joint surgery are not  necessary.  Exceptions are as follows:  1. History of prior total joint infection  2. Severely immunocompromised (Organ Transplant, cancer chemotherapy, Rheumatoid biologic meds such as [facility])  3. Poorly controlled diabetes (A1C &gt; 8.0, blood glucose over 200)  If you have one of these conditions, contact your surgeon for an antibiotic prescription, prior to your dental procedure.   Increase activity slowly as tolerated   Complete by: As directed    Post-operative opioid taper instructions:   Complete by: As directed    POST-OPERATIVE OPIOID TAPER INSTRUCTIONS: It is important to wean off of  your opioid medication as soon as possible. If you do not need pain medication after your surgery it is ok to stop day one. Opioids include: Codeine, Hydrocodone(Norco, Vicodin), Oxycodone(Percocet, oxycontin) and hydromorphone amongst others.  Long term and even short term use of opiods can cause: Increased pain response Dependence Constipation Depression Respiratory depression And more.  Withdrawal symptoms can include Flu like symptoms Nausea, vomiting And more Techniques to manage these symptoms Hydrate well Eat regular healthy meals Stay active Use relaxation techniques(deep breathing, meditating, yoga) Do Not substitute Alcohol to help with tapering If you have been on opioids for less than two weeks and do not have pain than it is ok to stop all together.  Plan to wean off of opioids This plan should start within one week post op of your joint replacement. Maintain the same interval or time between taking each dose and first decrease the dose.  Cut the total daily intake of opioids by one tablet each day Next start to increase the time between doses. The last dose that should be eliminated is the evening dose.           Follow-up Information     [facility] [place] Follow up.   Specialty: Home Health Services Why: [facility] will provide uou with  home health  PT services prearranged with MD's office. Contact information: [address] [place] [address] [phone]                  Signed: Donella Stade 04/28/2021, 2:28 PM

## 2021-05-08 ENCOUNTER — Ambulatory Visit (INDEPENDENT_AMBULATORY_CARE_PROVIDER_SITE_OTHER): Payer: Medicare PPO | Admitting: Orthopedic Surgery

## 2021-05-08 ENCOUNTER — Ambulatory Visit (INDEPENDENT_AMBULATORY_CARE_PROVIDER_SITE_OTHER): Payer: Medicare PPO

## 2021-05-08 ENCOUNTER — Encounter: Payer: Self-pay | Admitting: Orthopedic Surgery

## 2021-05-08 ENCOUNTER — Other Ambulatory Visit: Payer: Self-pay

## 2021-05-08 DIAGNOSIS — Z96652 Presence of left artificial knee joint: Secondary | ICD-10-CM

## 2021-05-08 NOTE — Progress Notes (Signed)
Post-Op Visit Note   Patient: Carol Tucker           Date of Birth: February 18, 1941           MRN: [id number] Visit Date: 05/08/2021 PCP: Pcp, No   Assessment & Plan:  Chief Complaint:  Chief Complaint  Patient presents with   Other    Left TKA on 04/23/21   Visit Diagnoses:  1. Status post total left knee replacement     Plan: [name] is a 80 y.o. female who presents s/p left total knee arthroplasty on 04/23/2021.  Patient is doing well and pain is overall controlled.  They are compliant with taking aspirin for DVT prophylaxis.  Denies any chest pain, SOB, calf pain.  Not having to take any of the opioid pain medication.  Only taking Mobic.  Using CPM machine up to 75 degrees.  On exam, incision is healing well without any evidence of infection or dehiscence.  Range of motion of the operative knee from 2 degrees to 95 degrees.  No calf tenderness.  Negative Homans' sign.  Able to perform straight leg raise without extensor lag.  Plan is to start outpatient physical therapy at [facility] in [place] which she did with her last knee replacement.  She is doing very well overall and exceeding expectations at this point.  She does have some increased soreness in the knee that occasionally wakes her up at night most nights after doing some chair squats with home health physical therapy.  Anticipate this will improve with time but recommended she return in 4 weeks for clinical recheck and call the office sooner if she is having unrelenting pain.  Patient and daughter agreed with plan.  Follow-up in 4 weeks..   Follow-Up Instructions: No follow-ups on file.   Orders:  Orders Placed This Encounter  Procedures   XR Knee 1-2 Views Left   No orders of the defined types were placed in this encounter.   Imaging: No results found.  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 FRAX does not indicate need for bisphosphonate but density has sig worsened since prior dexa on 12/17/2009   Pre-diabetes    Prediabetes     Family History  Problem Relation Age of Onset   Hypertension Father    Heart disease Father    Cancer Father  BLADDER & LUNG   Heart disease Paternal Grandmother    Alzheimer's disease Mother    Heart disease Brother    Heart disease Paternal Grandfather    Diabetes Paternal Grandfather    Colon cancer Neg Hx    Pancreatic cancer Neg Hx    Stomach cancer Neg Hx     Past Surgical History:  Procedure Laterality Date   ABDOMINAL EXPLORATION SURGERY N/A    40 years ago   APPENDECTOMY     CATARACT EXTRACTION W/ INTRAOCULAR LENS  IMPLANT, BILATERAL Bilateral    5 years ago   DILATION AND CURETTAGE OF UTERUS  08/19/1967   MISSED AB   KNEE ARTHROSCOPY  08/18/2005   NECK SURGERY  08/18/2006   NECK TUMOR   TOTAL KNEE ARTHROPLASTY Right 12/11/2020   Procedure: RIGHT TOTAL KNEE ARTHROPLASTY;  Surgeon: Meredith Pel, MD;  Location: [facility];  Service: Orthopedics;   Laterality: Right;   TOTAL KNEE ARTHROPLASTY Left 04/23/2021   Procedure: LEFT TOTAL KNEE ARTHROPLASTY;  Surgeon: Meredith Pel, MD;  Location: [facility];  Service: Orthopedics;  Laterality: Left;   TUBAL LIGATION  08/18/1978   tumor removal salivary gland     Social History   Occupational History   Not on file  Tobacco Use   Smoking status: Former    Types: Cigarettes    Quit date: 1975    Years since quitting: 47.7   Smokeless tobacco: Never  Vaping Use   Vaping Use: Never used  Substance and Sexual Activity   Alcohol use: Yes    Comment: Rare- 2 glasses of wine a year   Drug use: No    Frequency: 3.0 times per week   Sexual activity: Never

## 2021-05-11 DIAGNOSIS — M1712 Unilateral primary osteoarthritis, left knee: Secondary | ICD-10-CM

## 2021-06-05 ENCOUNTER — Ambulatory Visit (INDEPENDENT_AMBULATORY_CARE_PROVIDER_SITE_OTHER): Payer: Medicare PPO | Admitting: Orthopedic Surgery

## 2021-06-05 ENCOUNTER — Other Ambulatory Visit: Payer: Self-pay

## 2021-06-05 DIAGNOSIS — Z96652 Presence of left artificial knee joint: Secondary | ICD-10-CM

## 2021-06-09 ENCOUNTER — Encounter: Payer: Self-pay | Admitting: Orthopedic Surgery

## 2021-06-09 NOTE — Progress Notes (Signed)
Post-Op Visit Note   Patient: Carol Tucker           Date of Birth: [birth year]           MRN: [id number] Visit Date: 06/05/2021 PCP: Pcp, No   Assessment & Plan:  Chief Complaint:  Chief Complaint  Patient presents with   Left Knee - Routine Post Op    Left TKA on 04/23/21   Visit Diagnoses:  1. Status post total left knee replacement     Plan: [name] is a 80 year old patient who underwent left total knee replacement 04/23/2021.  She 6 weeks out from surgery.  She does home exercise program with strengthening and flexibility.  Doing a stationary bike.  Has done physical therapy in [place] but not here.  On examination she has range of motion 0 to about 100.  Strength is improving.  Incision intact.  No calf tenderness negative Homans today.  Overall Carol Tucker has done very well with her left knee replacement.  She is actually moving back to [place] in the near future.  Based on her experience with the right knee she would like to continue with home exercises only which I think is okay in her case.  Follow-up with Carol Tucker as needed.  Follow-Up Instructions: Return if symptoms worsen or fail to improve.   Orders:  No orders of the defined types were placed in this encounter.  No orders of the defined types were placed in this encounter.   Imaging: No results found.  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 FRAX does not indicate need for bisphosphonate but density has sig worsened since prior dexa on 12/17/2009   Pre-diabetes    Prediabetes     Family History  Problem Relation Age of Onset   Hypertension Father    Heart disease Father    Cancer Father        BLADDER & LUNG   Heart disease Paternal Grandmother    Alzheimer's disease Mother    Heart disease Brother    Heart disease Paternal Grandfather    Diabetes Paternal Grandfather    Colon cancer Neg Hx    Pancreatic cancer Neg Hx    Stomach cancer Neg Hx     Past Surgical History:  Procedure Laterality Date   ABDOMINAL EXPLORATION SURGERY N/A  40 years ago   APPENDECTOMY     CATARACT EXTRACTION W/ INTRAOCULAR LENS  IMPLANT, BILATERAL Bilateral    5 years ago   DILATION AND CURETTAGE OF UTERUS  08/19/1967   MISSED AB   KNEE ARTHROSCOPY  08/18/2005   NECK SURGERY  08/18/2006   NECK TUMOR   TOTAL KNEE ARTHROPLASTY Right 12/11/2020   Procedure: RIGHT TOTAL KNEE ARTHROPLASTY;  Surgeon: Meredith Pel, MD;  Location: [facility];  Service: Orthopedics;  Laterality: Right;   TOTAL KNEE ARTHROPLASTY Left 04/23/2021   Procedure: LEFT TOTAL KNEE ARTHROPLASTY;  Surgeon: Meredith Pel, MD;  Location: [facility];  Service: Orthopedics;  Laterality: Left;   TUBAL LIGATION  08/18/1978   tumor removal salivary gland     Social History   Occupational History   Not on file  Tobacco Use   Smoking status: Former    Types: Cigarettes    Quit date: 1975    Years since quitting: 47.8   Smokeless tobacco: Never   Vaping Use   Vaping Use: Never used  Substance and Sexual Activity   Alcohol use: Yes    Comment: Rare- 2 glasses of wine a year   Drug use: No    Frequency: 3.0 times per week   Sexual activity: Never

## 2021-06-14 ENCOUNTER — Telehealth: Payer: Self-pay | Admitting: Orthopedic Surgery

## 2021-06-14 NOTE — Telephone Encounter (Signed)
Pt called with some medical questions. Please call pt at [phone].

## 2021-06-17 ENCOUNTER — Telehealth: Payer: Self-pay | Admitting: Orthopedic Surgery

## 2021-06-17 ENCOUNTER — Other Ambulatory Visit: Payer: Self-pay | Admitting: Surgical

## 2021-06-17 MED ORDER — AMOXICILLIN 500 MG PO TABS: ORAL_TABLET | ORAL | 0 refills | Status: AC

## 2021-06-17 NOTE — Telephone Encounter (Signed)
Patient called. She is asking if Dr. Marlou Sa would send in an antibiotic to [facility] in [place]

## 2021-06-17 NOTE — Telephone Encounter (Signed)
Sent in

## 2021-06-17 NOTE — Telephone Encounter (Signed)
I called patient and advised.
